# Patient Record
Sex: Female | Born: 1945 | Race: White | Hispanic: No | Marital: Married | Smoking: Never smoker
Health system: Southern US, Community
[De-identification: ages and names within clinical notes are randomized; demographics above are authoritative.]

## PROBLEM LIST (undated history)

## (undated) DIAGNOSIS — K219 Gastro-esophageal reflux disease without esophagitis: Secondary | ICD-10-CM

## (undated) DIAGNOSIS — E669 Obesity, unspecified: Secondary | ICD-10-CM

## (undated) DIAGNOSIS — D649 Anemia, unspecified: Secondary | ICD-10-CM

## (undated) DIAGNOSIS — C50919 Malignant neoplasm of unspecified site of unspecified female breast: Secondary | ICD-10-CM

## (undated) DIAGNOSIS — C50211 Malignant neoplasm of upper-inner quadrant of right female breast: Secondary | ICD-10-CM

## (undated) DIAGNOSIS — F32A Depression, unspecified: Secondary | ICD-10-CM

## (undated) DIAGNOSIS — R232 Flushing: Secondary | ICD-10-CM

## (undated) DIAGNOSIS — M1990 Unspecified osteoarthritis, unspecified site: Secondary | ICD-10-CM

## (undated) DIAGNOSIS — E785 Hyperlipidemia, unspecified: Secondary | ICD-10-CM

## (undated) DIAGNOSIS — E162 Hypoglycemia, unspecified: Secondary | ICD-10-CM

## (undated) DIAGNOSIS — F419 Anxiety disorder, unspecified: Secondary | ICD-10-CM

## (undated) DIAGNOSIS — Z923 Personal history of irradiation: Secondary | ICD-10-CM

## (undated) DIAGNOSIS — I10 Essential (primary) hypertension: Secondary | ICD-10-CM

## (undated) DIAGNOSIS — E538 Deficiency of other specified B group vitamins: Secondary | ICD-10-CM

## (undated) DIAGNOSIS — Z8489 Family history of other specified conditions: Secondary | ICD-10-CM

## (undated) DIAGNOSIS — E039 Hypothyroidism, unspecified: Secondary | ICD-10-CM

## (undated) HISTORY — PX: TUBAL LIGATION: SHX77

## (undated) HISTORY — DX: Hyperlipidemia, unspecified: E78.5

## (undated) HISTORY — DX: Malignant neoplasm of upper-inner quadrant of right female breast: C50.211

## (undated) HISTORY — DX: Essential (primary) hypertension: I10

## (undated) HISTORY — DX: Gastro-esophageal reflux disease without esophagitis: K21.9

## (undated) HISTORY — DX: Flushing: R23.2

## (undated) HISTORY — PX: BREAST BIOPSY: SHX20

## (undated) HISTORY — PX: ABDOMINAL HYSTERECTOMY: SHX81

## (undated) HISTORY — PX: PARTIAL HYSTERECTOMY: SHX80

## (undated) HISTORY — DX: Hypoglycemia, unspecified: E16.2

## (undated) HISTORY — DX: Obesity, unspecified: E66.9

## (undated) HISTORY — PX: BREAST LUMPECTOMY: SHX2

## (undated) HISTORY — PX: KNEE SURGERY: SHX244

## (undated) HISTORY — PX: HERNIA REPAIR: SHX51

## (undated) HISTORY — DX: Anemia, unspecified: D64.9

## (undated) HISTORY — DX: Deficiency of other specified B group vitamins: E53.8

---

## 1999-03-04 ENCOUNTER — Other Ambulatory Visit: Admission: RE | Admit: 1999-03-04 | Discharge: 1999-03-04 | Payer: Self-pay | Admitting: *Deleted

## 2000-01-25 ENCOUNTER — Encounter: Admission: RE | Admit: 2000-01-25 | Discharge: 2000-01-25 | Payer: Self-pay | Admitting: *Deleted

## 2000-01-25 ENCOUNTER — Encounter: Payer: Self-pay | Admitting: *Deleted

## 2000-03-22 ENCOUNTER — Other Ambulatory Visit: Admission: RE | Admit: 2000-03-22 | Discharge: 2000-03-22 | Payer: Self-pay | Admitting: *Deleted

## 2001-02-04 ENCOUNTER — Encounter: Payer: Self-pay | Admitting: *Deleted

## 2001-02-04 ENCOUNTER — Encounter: Admission: RE | Admit: 2001-02-04 | Discharge: 2001-02-04 | Payer: Self-pay | Admitting: *Deleted

## 2001-02-07 ENCOUNTER — Other Ambulatory Visit: Admission: RE | Admit: 2001-02-07 | Discharge: 2001-02-07 | Payer: Self-pay | Admitting: *Deleted

## 2002-02-20 ENCOUNTER — Encounter: Admission: RE | Admit: 2002-02-20 | Discharge: 2002-02-20 | Payer: Self-pay | Admitting: *Deleted

## 2002-02-20 ENCOUNTER — Encounter: Payer: Self-pay | Admitting: *Deleted

## 2002-03-24 ENCOUNTER — Other Ambulatory Visit: Admission: RE | Admit: 2002-03-24 | Discharge: 2002-03-24 | Payer: Self-pay | Admitting: *Deleted

## 2003-05-01 ENCOUNTER — Encounter: Admission: RE | Admit: 2003-05-01 | Discharge: 2003-05-01 | Payer: Self-pay | Admitting: *Deleted

## 2003-05-01 ENCOUNTER — Encounter: Payer: Self-pay | Admitting: *Deleted

## 2003-05-05 ENCOUNTER — Other Ambulatory Visit: Admission: RE | Admit: 2003-05-05 | Discharge: 2003-05-05 | Payer: Self-pay | Admitting: *Deleted

## 2003-06-02 ENCOUNTER — Ambulatory Visit (HOSPITAL_COMMUNITY): Admission: RE | Admit: 2003-06-02 | Discharge: 2003-06-02 | Payer: Self-pay | Admitting: Family Medicine

## 2003-06-03 ENCOUNTER — Ambulatory Visit (HOSPITAL_COMMUNITY): Admission: RE | Admit: 2003-06-03 | Discharge: 2003-06-03 | Payer: Self-pay | Admitting: Family Medicine

## 2003-08-01 HISTORY — PX: GASTRIC BYPASS: SHX52

## 2004-06-21 ENCOUNTER — Encounter: Admission: RE | Admit: 2004-06-21 | Discharge: 2004-06-21 | Payer: Self-pay | Admitting: Obstetrics and Gynecology

## 2004-08-11 ENCOUNTER — Other Ambulatory Visit: Admission: RE | Admit: 2004-08-11 | Discharge: 2004-08-11 | Payer: Self-pay | Admitting: Gynecology

## 2004-08-17 ENCOUNTER — Ambulatory Visit: Payer: Self-pay | Admitting: Family Medicine

## 2004-11-17 ENCOUNTER — Ambulatory Visit: Payer: Self-pay | Admitting: Family Medicine

## 2004-11-23 ENCOUNTER — Ambulatory Visit: Payer: Self-pay | Admitting: Family Medicine

## 2005-09-21 ENCOUNTER — Encounter: Admission: RE | Admit: 2005-09-21 | Discharge: 2005-09-21 | Payer: Self-pay | Admitting: Gynecology

## 2005-10-13 ENCOUNTER — Other Ambulatory Visit: Admission: RE | Admit: 2005-10-13 | Discharge: 2005-10-13 | Payer: Self-pay | Admitting: Gynecology

## 2006-03-20 ENCOUNTER — Ambulatory Visit: Payer: Self-pay | Admitting: Family Medicine

## 2006-03-22 ENCOUNTER — Ambulatory Visit: Payer: Self-pay | Admitting: Family Medicine

## 2006-05-18 ENCOUNTER — Ambulatory Visit: Payer: Self-pay | Admitting: Family Medicine

## 2006-10-03 ENCOUNTER — Encounter: Admission: RE | Admit: 2006-10-03 | Discharge: 2006-10-03 | Payer: Self-pay | Admitting: Family Medicine

## 2006-12-19 ENCOUNTER — Other Ambulatory Visit: Admission: RE | Admit: 2006-12-19 | Discharge: 2006-12-19 | Payer: Self-pay | Admitting: Gynecology

## 2007-03-07 DIAGNOSIS — I10 Essential (primary) hypertension: Secondary | ICD-10-CM | POA: Insufficient documentation

## 2007-03-07 DIAGNOSIS — Z6841 Body Mass Index (BMI) 40.0 and over, adult: Secondary | ICD-10-CM | POA: Insufficient documentation

## 2007-03-07 DIAGNOSIS — B029 Zoster without complications: Secondary | ICD-10-CM | POA: Insufficient documentation

## 2007-03-07 DIAGNOSIS — E669 Obesity, unspecified: Secondary | ICD-10-CM | POA: Insufficient documentation

## 2007-03-07 DIAGNOSIS — E7800 Pure hypercholesterolemia, unspecified: Secondary | ICD-10-CM | POA: Insufficient documentation

## 2007-03-07 DIAGNOSIS — K219 Gastro-esophageal reflux disease without esophagitis: Secondary | ICD-10-CM | POA: Insufficient documentation

## 2007-03-07 DIAGNOSIS — E538 Deficiency of other specified B group vitamins: Secondary | ICD-10-CM | POA: Insufficient documentation

## 2007-03-08 ENCOUNTER — Ambulatory Visit: Payer: Self-pay | Admitting: Family Medicine

## 2007-05-28 ENCOUNTER — Ambulatory Visit: Payer: Self-pay | Admitting: Family Medicine

## 2007-05-28 DIAGNOSIS — E162 Hypoglycemia, unspecified: Secondary | ICD-10-CM | POA: Insufficient documentation

## 2007-10-04 ENCOUNTER — Encounter: Admission: RE | Admit: 2007-10-04 | Discharge: 2007-10-04 | Payer: Self-pay | Admitting: Gynecology

## 2007-10-22 ENCOUNTER — Ambulatory Visit: Payer: Self-pay | Admitting: Family Medicine

## 2007-11-29 ENCOUNTER — Ambulatory Visit: Payer: Self-pay | Admitting: Family Medicine

## 2008-05-22 ENCOUNTER — Ambulatory Visit: Payer: Self-pay | Admitting: Family Medicine

## 2008-05-25 LAB — CONVERTED CEMR LAB
AST: 18 units/L (ref 0–37)
Albumin: 4 g/dL (ref 3.5–5.2)
Alkaline Phosphatase: 47 units/L (ref 39–117)
BUN: 17 mg/dL (ref 6–23)
Basophils Relative: 0.8 % (ref 0.0–3.0)
Bilirubin, Direct: 0.1 mg/dL (ref 0.0–0.3)
Calcium: 8.6 mg/dL (ref 8.4–10.5)
Cholesterol: 160 mg/dL (ref 0–200)
Eosinophils Absolute: 0.2 10*3/uL (ref 0.0–0.7)
Eosinophils Relative: 3.6 % (ref 0.0–5.0)
Folate: 10.7 ng/mL
GFR calc non Af Amer: 78 mL/min
HDL: 54.6 mg/dL (ref >39.0)
MCV: 90.7 fL (ref 78.0–100.0)
Neutro Abs: 3.2 10*3/uL (ref 1.4–7.7)
Potassium: 3.5 meq/L (ref 3.5–5.1)
Total Bilirubin: 0.7 mg/dL (ref 0.3–1.2)
Total CHOL/HDL Ratio: 2.9
Total Protein: 7 g/dL (ref 6.0–8.3)
Triglycerides: 70 mg/dL (ref 0–149)
VLDL: 14 mg/dL (ref 0–40)
Vitamin B-12: >1500 pg/mL — ABNORMAL HIGH (ref 211–911)

## 2008-11-20 ENCOUNTER — Ambulatory Visit: Payer: Self-pay | Admitting: Women's Health

## 2008-11-20 ENCOUNTER — Encounter: Payer: Self-pay | Admitting: Women's Health

## 2008-11-20 ENCOUNTER — Other Ambulatory Visit: Admission: RE | Admit: 2008-11-20 | Discharge: 2008-11-20 | Payer: Self-pay | Admitting: Gynecology

## 2008-11-27 ENCOUNTER — Encounter: Admission: RE | Admit: 2008-11-27 | Discharge: 2008-11-27 | Payer: Self-pay | Admitting: Gynecology

## 2008-11-27 ENCOUNTER — Ambulatory Visit: Payer: Self-pay | Admitting: Obstetrics and Gynecology

## 2008-12-04 ENCOUNTER — Encounter: Admission: RE | Admit: 2008-12-04 | Discharge: 2008-12-04 | Payer: Self-pay | Admitting: Gynecology

## 2009-12-07 ENCOUNTER — Ambulatory Visit: Payer: Self-pay | Admitting: Family Medicine

## 2009-12-07 DIAGNOSIS — R4589 Other symptoms and signs involving emotional state: Secondary | ICD-10-CM | POA: Insufficient documentation

## 2009-12-07 DIAGNOSIS — R5381 Other malaise: Secondary | ICD-10-CM | POA: Insufficient documentation

## 2009-12-07 DIAGNOSIS — R5383 Other fatigue: Secondary | ICD-10-CM

## 2009-12-08 LAB — CONVERTED CEMR LAB
CO2: 32 meq/L (ref 19–32)
Chloride: 101 meq/L (ref 96–112)
Cholesterol: 174 mg/dL (ref 0–200)
Creatinine, Ser: 0.6 mg/dL (ref 0.4–1.2)
Eosinophils Absolute: 0.2 10*3/uL (ref 0.0–0.7)
GFR calc non Af Amer: 99.73 mL/min (ref >60)
HDL: 66.1 mg/dL (ref >39.00)
Lymphs Abs: 1.9 10*3/uL (ref 0.7–4.0)
MCHC: 33.6 g/dL (ref 30.0–36.0)
MCV: 84.2 fL (ref 78.0–100.0)
Monocytes Absolute: 0.6 10*3/uL (ref 0.1–1.0)
Neutro Abs: 3.6 10*3/uL (ref 1.4–7.7)
Platelets: 275 10*3/uL (ref 150.0–400.0)
RDW: 15.4 % — ABNORMAL HIGH (ref 11.5–14.6)
Total Bilirubin: 0.5 mg/dL (ref 0.3–1.2)
Triglycerides: 77 mg/dL (ref 0.0–149.0)

## 2009-12-09 ENCOUNTER — Encounter: Payer: Self-pay | Admitting: Family Medicine

## 2009-12-28 ENCOUNTER — Ambulatory Visit: Payer: Self-pay | Admitting: Family Medicine

## 2009-12-28 DIAGNOSIS — D509 Iron deficiency anemia, unspecified: Secondary | ICD-10-CM | POA: Insufficient documentation

## 2009-12-28 DIAGNOSIS — E559 Vitamin D deficiency, unspecified: Secondary | ICD-10-CM | POA: Insufficient documentation

## 2010-01-03 ENCOUNTER — Ambulatory Visit: Payer: Self-pay | Admitting: Family Medicine

## 2010-01-05 ENCOUNTER — Encounter (INDEPENDENT_AMBULATORY_CARE_PROVIDER_SITE_OTHER): Payer: Self-pay | Admitting: *Deleted

## 2010-02-08 ENCOUNTER — Ambulatory Visit: Payer: Self-pay | Admitting: Family Medicine

## 2010-02-22 ENCOUNTER — Ambulatory Visit: Payer: Self-pay | Admitting: Family Medicine

## 2010-02-23 LAB — CONVERTED CEMR LAB
Basophils Relative: 0.9 % (ref 0.0–3.0)
Eosinophils Relative: 3.5 % (ref 0.0–5.0)
Ferritin: 12.6 ng/mL (ref 10.0–291.0)
HCT: 36.6 % (ref 36.0–46.0)
Hemoglobin: 12.3 g/dL (ref 12.0–15.0)
Iron: 74 ug/dL (ref 42–145)
Lymphocytes Relative: 31.5 % (ref 12.0–46.0)
MCV: 88.3 fL (ref 78.0–100.0)
RBC: 4.15 M/uL (ref 3.87–5.11)
WBC: 7 10*3/uL (ref 4.5–10.5)

## 2010-02-24 LAB — CONVERTED CEMR LAB: Vit D, 25-Hydroxy: 28 ng/mL — ABNORMAL LOW (ref 30–89)

## 2010-04-20 ENCOUNTER — Ambulatory Visit: Payer: Self-pay | Admitting: Family Medicine

## 2010-05-20 ENCOUNTER — Ambulatory Visit: Payer: Self-pay | Admitting: Family Medicine

## 2010-05-24 ENCOUNTER — Encounter: Admission: RE | Admit: 2010-05-24 | Discharge: 2010-05-24 | Payer: Self-pay | Admitting: Family Medicine

## 2010-05-24 LAB — HM MAMMOGRAPHY

## 2010-05-27 ENCOUNTER — Encounter: Payer: Self-pay | Admitting: Family Medicine

## 2010-06-21 ENCOUNTER — Ambulatory Visit: Payer: Self-pay | Admitting: Family Medicine

## 2010-07-19 ENCOUNTER — Ambulatory Visit: Payer: Self-pay | Admitting: Family Medicine

## 2010-08-21 ENCOUNTER — Encounter: Payer: Self-pay | Admitting: Gynecology

## 2010-08-29 ENCOUNTER — Ambulatory Visit: Admit: 2010-08-29 | Payer: Self-pay | Admitting: Family Medicine

## 2010-08-30 NOTE — Assessment & Plan Note (Signed)
Summary: MEDS FOR REFILL/RI   Vital Signs:  Patient profile:   65 year old female Height:      63.25 inches Weight:      182.75 pounds BMI:     32.23 Temp:     97.9 degrees F oral Pulse rate:   60 / minute Pulse rhythm:   regular BP sitting:   158 / 76  (left arm) Cuff size:   regular  Vitals Entered By: Lewanda Rife LPN (Dec 07, 2009 8:40 AM)  Serial Vital Signs/Assessments:  Time      Position  BP       Pulse  Resp  Temp     By                     138/70                         [name] MD  CC: Refill meds   History of Present Illness: here forf/u of HTN and lipids and B12 def   has been tired and stressed and run down  daughter had toxemia -- got really sick / CS - now has heart damage  taking care of the baby --- she is retired now thank goodness, father is not involved  overall a good baby - is 8 mo old  daughter needs a lot of medical care   hx of gastric bypass wt gain 24 lb from last visit  has been back to her surgeon for a check up  she is off the wagon and not exercising like she should  has f/u upcoming - will do some refresher education   thinks she needs her labs checked  low sugar in past   B12 shot every 2 mo -- is overdue  she does take her vitamins  not eating as much protien as she should   [facility]  [facility]   bp high today 158/76    Allergies: 1)  Penicillin  Past History:  Past Medical History: Last updated: 05/22/2008 GERD Hypertension hyperlipidemia obesity B12 def  hypoglycemia   Past Surgical History: Last updated: 2007-03-19 Hysterectomy- partial, bleeding (1990's) Left knee surgery (11/1998) Dexa- normal (08/2001,   09/2003) Gastric bypass surgery (08/2003) Hernia repair  Family History: Last updated: March 19, 2007 Father: deceased MI- smoker, ETOH Mother: deceased MI Siblings:   Social History: Last updated: 05/22/2008 Marital Status: Married Children: several  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 Eyes:  Denies blurring and eye irritation. CV:  Denies chest pain or discomfort, lightheadness, and palpitations. Resp:  Denies cough, shortness of breath, and wheezing. GI:  Denies abdominal pain, indigestion, and nausea. GU:  Denies discharge and dysuria. MS:  Complains of stiffness; denies joint pain and muscle aches. Derm:  Denies itching, lesion(s), poor wound healing, and rash. Neuro:  Denies numbness and tingling. Psych:  difficult stress - thinks she is doing ok . Endo:  Denies cold intolerance, excessive thirst, excessive urination, and heat intolerance. Heme:  Denies abnormal bruising and bleeding.  Physical Exam  General:  overweight but generally well appearing  Head:  normocephalic, atraumatic, and no abnormalities observed.   Eyes:  vision grossly intact, pupils equal, pupils round, and pupils reactive to light.  no conjunctival pallor, injection or  icterus  Ears:  R ear normal and L ear normal.   Nose:  no nasal discharge.   Mouth:  pharynx pink and moist.   Neck:  supple with full rom and no masses or thyromegally, no JVD or carotid bruit  Chest Wall:  No deformities, masses, or tenderness noted. Lungs:  Normal respiratory effort, chest expands symmetrically. Lungs are clear to auscultation, no crackles or wheezes. Heart:  Normal rate and regular rhythm. S1 and S2 normal without gallop, murmur, click, rub or other extra sounds. Abdomen:  Bowel sounds positive,abdomen soft and non-tender without masses, organomegaly or hernias noted. no renal bruits  Msk:  No deformity or scoliosis noted of thoracic or lumbar spine.  no acute joint changes Pulses:  R and L carotid,radial,femoral,dorsalis pedis and posterior tibial pulses are full and equal bilaterally Extremities:  No  clubbing, cyanosis, edema, or deformity noted with normal full range of motion of all joints.   Neurologic:  sensation intact to light touch, gait normal, and DTRs symmetrical and normal.   Skin:  Intact without suspicious lesions or rashes Cervical Nodes:  No lymphadenopathy noted Inguinal Nodes:  No significant adenopathy Psych:  normal affect, talkative and pleasant    Impression & Recommendations:  Problem # 1:  FATIGUE (ICD-780.79) Assessment New  suspect multifactorial with stress playing a big role also s/p gastric bypass and noncompliant with app diet and b12 shots B12 shot today lab  f/u 2-3 wk to disc   Orders: Prescription Created Electronically [phone])  Problem # 2:  HYPOGLYCEMIA NOS (ICD-251.2) Assessment: Deteriorated disc imp of low calorie freq meals with protien pt plans to return to dietary education lab today Orders: Venipuncture (60454) TLB-B12 + Folate Pnl (82746_82607-B12/FOL) TLB-Lipid Panel (80061-LIPID) TLB-Renal Function Panel (80069-RENAL) TLB-CBC Platelet - w/Differential (85025-CBCD) TLB-Hepatic/Liver Function Pnl (80076-HEPATIC) TLB-TSH (Thyroid Stimulating Hormone) (84443-TSH) T-Vitamin D (25-Hydroxy) (09811-91478) Prescription Created Electronically [phone])  Problem # 3:  VITAMIN B12 DEFICIENCY (ICD-266.2) Assessment: Deteriorated gastric bypass pt behind on suppl shot today check level  disc at f/u Orders: Venipuncture (13086) TLB-B12 + Folate Pnl (82746_82607-B12/FOL) TLB-Lipid Panel (80061-LIPID) TLB-Renal Function Panel (80069-RENAL) TLB-CBC Platelet - w/Differential (85025-CBCD) TLB-Hepatic/Liver Function Pnl (80076-HEPATIC) TLB-TSH (Thyroid Stimulating Hormone) (84443-TSH) T-Vitamin D (25-Hydroxy) (57846-96295) Vit B12 1000 mcg (J3420) Admin of Therapeutic Inj  intramuscular or subcutaneous (28413) Prescription Created Electronically [phone])  Problem # 4:  Hx of OBESITY (ICD-278.00) Assessment: Deteriorated wt gain after  gastric bypass due to stress and bad habits disc this in detail  f/u with surgeon and make plan  Problem # 5:  Hx of HYPERCHOLESTEROLEMIA (ICD-272.0) Assessment: Unchanged  lab today with diet and statin rev low sat fat diet today f.u to disc Her updated medication list for this problem includes:    Zocor 20 Mg Tabs (Simvastatin) .Marland Kitchen... Take one by mouth daily  Orders: Venipuncture (02725) TLB-B12 + Folate Pnl (36644_03474-Q59/DGL) TLB-Lipid Panel (80061-LIPID) TLB-Renal Function Panel (80069-RENAL) TLB-CBC Platelet - w/Differential (85025-CBCD) TLB-Hepatic/Liver Function Pnl (80076-HEPATIC) TLB-TSH (Thyroid Stimulating Hormone) (84443-TSH) T-Vitamin D (25-Hydroxy) (87564-33295) Prescription Created Electronically [phone])  Labs Reviewed: SGOT: 18 (05/22/2008)   SGPT: 13 (05/22/2008)   HDL:54.6 (05/22/2008)  LDL:91 (05/22/2008)  Chol:160 (05/22/2008)  Trig:70 (05/22/2008)  Problem # 6:  STRESS REACTION, ACUTE, WITH EMOTIONAL DISTURBANCE (ICD-308.0) Assessment: New  needs help at home caring for baby and her daughter disc options is overloaded and unable to care for herself - voiced understanding counseling may help in future  has good support  will disc further at f/u  Orders: Prescription Created Electronically [phone])  Complete Medication List: 1)  Zocor 20 Mg Tabs (Simvastatin) .... Take one by mouth daily 2)  Omeprazole 20 Mg Cpdr (Omeprazole) .Marland Kitchen.. 1 by mouth once daily 3)  Hydrochlorothiazide 25 Mg Tabs (Hydrochlorothiazide) .... Take 1 tablet by mouth once a day 4)  Vitamin B-12 Cr 1000 Mcg Tbcr (Cyanocobalamin) .... Injection q 2 months 5)  Microgestin 1.5/30 1.5-30 Mg-mcg Tabs (Norethindrone acet-ethinyl est) 6)  Multivitamins Tabs (Multiple vitamin) .... One by mouth daily 7)  Fish Oil Tabs  .... Daily 8)  Calcium  .... Daily 9)  Vitamin E Tabs  .... Daily  Patient Instructions: 1)  get back to low cal frequent meals with protien 2)  B12 shot today  3)   get back to exercise  4)  labs today 5)  follow up in about 2-3 weeks  Prescriptions: HYDROCHLOROTHIAZIDE 25 MG TABS (HYDROCHLOROTHIAZIDE) Take 1 tablet by mouth once a day  #90 x 3   Entered and Authorized by:   [name] MD   Signed by:   [name] MD on 12/07/2009   Method used:   Electronically to        [facility] [address] (retail)       [address], [address]       [place], [place]  [address]       Ph: [phone]       Fax: [phone]   RxID:   [id number] OMEPRAZOLE 20 MG CPDR (OMEPRAZOLE) 1 by mouth once daily  #90 x 3   Entered and Authorized by:   [name] MD   Signed by:   [name] MD on 12/07/2009   Method used:   Electronically to        [facility] [address] (retail)       [address], [address]       [place], [place]  [address]       Ph: [phone]       Fax: [phone]   RxID:   [id number] ZOCOR 20 MG TABS (SIMVASTATIN) take one by mouth daily  #90 x 3   Entered and Authorized by:   [name] MD   Signed by:   [name] MD on 12/07/2009   Method used:   Electronically to        [facility] [address] (retail)       [address], [address]       [place], [place]  [address]       Ph: [phone]       Fax: [phone]   RxID:   [id number]   Current Allergies (reviewed today): PENICILLIN   Medication Administration  Injection # 1:    Medication: Vit B12 1000 mcg    Diagnosis: VITAMIN B12 DEFICIENCY (ICD-266.2)    Route: IM    Site: L deltoid    Exp Date: 06/01/2011    Lot #: [id number]    Mfr: American Regent    Patient tolerated injection without complications    Given by: Lewanda Rife LPN (Dec 07, 2009 10:34 AM)  Orders Added: 1)  Venipuncture [44315] 2)  TLB-B12 + Folate Pnl [82746_82607-B12/FOL] 3)  TLB-Lipid Panel [80061-LIPID] 4)  TLB-Renal Function Panel [80069-RENAL] 5)  TLB-CBC Platelet -  w/Differential [85025-CBCD]  6)  TLB-Hepatic/Liver Function Pnl [80076-HEPATIC] 7)  TLB-TSH (Thyroid Stimulating Hormone) [84443-TSH] 8)  T-Vitamin D (25-Hydroxy) [[address]-60454] 9)  Vit B12 1000 mcg [J3420] 10)  Admin of Therapeutic Inj  intramuscular or subcutaneous [96372] 11)  Prescription Created Electronically [G8553] 12)  Est. Patient Level IV [09811]

## 2010-08-30 NOTE — Letter (Signed)
Summary: Results Follow up Letter  [place] at [facility]  [address] [place], [place] [address]   Phone: [phone]  Fax: [phone]    01/05/2010 MRN: [id number]    Jennifer Bartlett [address] [facility] [address] [place], [place]  [address]    Dear [name],  The following are the results of your recent test(s):  Test         Result    Pap Smear:        Normal _____  Not Normal _____ Comments: ______________________________________________________ Cholesterol: LDL(Bad cholesterol):         Your goal is less than:         HDL (Good cholesterol):       Your goal is more than: Comments:  ______________________________________________________ Mammogram:        Normal _____  Not Normal _____ Comments:  ___________________________________________________________________ Hemoccult:        Normal __X___  Not normal _______ Comments:      _____________________________________________________________________ Other Tests:    We routinely do not discuss normal results over the telephone.  If you desire a copy of the results, or you have any questions about this information we can discuss them at your next office visit.   Sincerely,    Marne A. Milinda Antis, M.D.  MAT:lsf

## 2010-08-30 NOTE — Letter (Signed)
Summary: Results Follow up Letter  [place] at [facility]  [address] [place], [place] [address]   Phone: [phone]  Fax: [phone]    05/27/2010 MRN: [id number]    Jennifer Bartlett [address] [place], [place]  [address]    Dear Ms. Devonshire,  The following are the results of your recent test(s):  Test         Result    Pap Smear:        Normal _____  Not Normal _____ Comments: ______________________________________________________ Cholesterol: LDL(Bad cholesterol):         Your goal is less than:         HDL (Good cholesterol):       Your goal is more than: Comments:  ______________________________________________________ Mammogram:        Normal ___X__  Not Normal _____ Comments:Repeat in one year.   ___________________________________________________________________ Hemoccult:        Normal _____  Not normal _______ Comments:    _____________________________________________________________________ Other Tests:    We routinely do not discuss normal results over the telephone.  If you desire a copy of the results, or you have any questions about this information we can discuss them at your next office visit.   Sincerely,    Idamae Schuller Tower,MD  MT/ri

## 2010-08-30 NOTE — Assessment & Plan Note (Signed)
Summary: 2-3 WEEK FOLLOW UP/RBH   Vital Signs:  Patient profile:   65 year old female Height:      63.25 inches Weight:      178 pounds BMI:     31.40 Temp:     97.7 degrees F oral Pulse rate:   60 / minute Pulse rhythm:   regular BP sitting:   120 / 76  (left arm) Cuff size:   regular  Vitals Entered By: Lewanda Rife LPN (Dec 28, 2009 8:20 AM) CC: 2-3 week follow up   History of Present Illness: here for f/u of fatigue and multiple issues   is starting to feel some better overall with supplements   wt is down 4 lb and bp is better today 120/76  vit D def -- level was 14 started on high dose weekly tx   lipids fairly good with trig 77/ HDL 66 and LDL 93  anemia noted with hv of 10.9-- she was inst to start ferrous sulfate 325 daily if tol (this was down from 12.3 pref)  is not suprised by that based on her diet habits  does not care for meat and high iron foods  usually avoids red meat   has f/u with her gastric bypass surgeon   cannot do colonosc due to her surgery / risks   [facility] -- working on it a lot more   Allergies: 1)  Penicillin  Past History:  Past Medical History: Last updated: 05/22/2008 GERD Hypertension hyperlipidemia obesity B12 def  hypoglycemia   Past Surgical History: Last updated: 2007-03-26 Hysterectomy- partial, bleeding (1990's) Left knee surgery (11/1998) Dexa- normal (08/2001,   09/2003) Gastric bypass surgery (08/2003) Hernia repair  Family History: Last updated: 03-26-07 Father: deceased MI- smoker, ETOH Mother: deceased MI Siblings:   Social History: Last updated: 05/22/2008 Marital Status: Married Children: several  Occupation: [employer] regular walking for exercise  Risk Factors: Smoking Status: never (2007-03-26)  Review of Systems General:  Complains of fatigue; denies fever, loss of appetite, and malaise. Eyes:  Denies blurring and eye irritation. CV:  Denies chest pain or  discomfort and lightheadness. Resp:  Denies cough, shortness of breath, and wheezing. GI:  Denies abdominal pain, change in bowel habits, and indigestion. MS:  Denies joint pain, joint redness, and joint swelling. Derm:  Denies itching, lesion(s), poor wound healing, and rash. Neuro:  Denies numbness and tingling. Psych:  mood is fairly good . Endo:  Denies cold intolerance, excessive thirst, excessive urination, and heat intolerance. Heme:  Denies abnormal bruising and bleeding.  Physical Exam  General:  overweight but generally well appearing  Head:  normocephalic, atraumatic, and no abnormalities observed.   Eyes:  vision grossly intact, pupils equal, pupils round, and pupils reactive to light.  no conjunctival pallor, injection or icterus  Mouth:  pharynx pink and moist.   Neck:  supple with full rom and no masses or thyromegally, no JVD or carotid bruit  Lungs:  Normal respiratory effort, chest expands symmetrically. Lungs are clear to auscultation, no crackles or wheezes. Heart:  Normal rate and regular rhythm. S1 and S2 normal without gallop, murmur, click, rub or other extra sounds. Abdomen:  Bowel sounds positive,abdomen soft and non-tender without masses, organomegaly or hernias noted. no renal bruits  Extremities:  No clubbing, cyanosis, edema, or deformity noted with normal full range of motion of all joints.   Neurologic:  sensation intact to light touch, gait normal, and DTRs symmetrical and  normal.   Skin:  Intact without suspicious lesions or rashes Cervical Nodes:  No lymphadenopathy noted Inguinal Nodes:  No significant adenopathy Psych:  normal affect, talkative and pleasant    Impression & Recommendations:  Problem # 1:  FATIGUE (ICD-780.79) Assessment Improved this is imp with correction of B12 def and supplement of iron and vit D as well as better diet and exercise urged her to keep it up   Problem # 2:  HYPOGLYCEMIA NOS (ICD-251.2) Assessment: Improved imp  with higher protien diet   Problem # 3:  VITAMIN B12 DEFICIENCY (ICD-266.2) Assessment: Improved rev labs with pt  level ok after shot  continue Q 2 mo shots for gastric bypass pt  Problem # 4:  Hx of HYPERCHOLESTEROLEMIA (ICD-272.0) Assessment: Unchanged  rev labs in detail- is well controlled with statin and diet  no changes disc low sat fat diet  Her updated medication list for this problem includes:    Zocor 20 Mg Tabs (Simvastatin) .Marland Kitchen... Take one by mouth daily  Labs Reviewed: SGOT: 21 (12/07/2009)   SGPT: 16 (12/07/2009)   HDL:66.10 (12/07/2009), 54.6 (05/22/2008)  LDL:93 (12/07/2009), 91 (05/22/2008)  Chol:174 (12/07/2009), 160 (05/22/2008)  Trig:77.0 (12/07/2009), 70 (05/22/2008)  Problem # 5:  HYPERTENSION (ICD-401.9) Assessment: Improved  bp is much imp today no change in med keep up good habits  Her updated medication list for this problem includes:    Hydrochlorothiazide 25 Mg Tabs (Hydrochlorothiazide) .Marland Kitchen... Take 1 tablet by mouth once a day  BP today: 120/76 Prior BP: 158/76 (12/07/2009)  Labs Reviewed: K+: 4.3 (12/07/2009) Creat: : 0.6 (12/07/2009)   Chol: 174 (12/07/2009)   HDL: 66.10 (12/07/2009)   LDL: 93 (12/07/2009)   TG: 77.0 (12/07/2009)  Problem # 6:  ANEMIA, IRON DEFICIENCY (ICD-280.9) Assessment: New  this could be causing signif fatigue  is feeling better with iron 325 daily- is tolerating  disc stool softeners as needed  lab in 8 weeks and adv also given stool immunoassay card for screen  Her updated medication list for this problem includes:    Vitamin B-12 Cr 1000 Mcg Tbcr (Cyanocobalamin) ..... Injection q 2 months  Hgb: 10.9 (12/07/2009)   Hct: 32.6 (12/07/2009)   Platelets: 275.0 (12/07/2009) RBC: 3.88 (12/07/2009)   RDW: 15.4 (12/07/2009)   WBC: 6.4 (12/07/2009) MCV: 84.2 (12/07/2009)   MCHC: 33.6 (12/07/2009) B12: >1500 pg/mL (12/07/2009)   Folate: 11.2 (12/07/2009)   TSH: 2.30 (12/07/2009)  Problem # 7:  UNSPECIFIED VITAMIN D  DEFICIENCY (ICD-268.9) Assessment: New new and significant feeling better with high dose weekly suppl disc outdoor time  re check in 8 weeks and adv   Complete Medication List: 1)  Zocor 20 Mg Tabs (Simvastatin) .... Take one by mouth daily 2)  Omeprazole 20 Mg Cpdr (Omeprazole) .Marland Kitchen.. 1 by mouth once daily 3)  Hydrochlorothiazide 25 Mg Tabs (Hydrochlorothiazide) .... Take 1 tablet by mouth once a day 4)  Vitamin B-12 Cr 1000 Mcg Tbcr (Cyanocobalamin) .... Injection q 2 months 5)  Microgestin 1.5/30 1.5-30 Mg-mcg Tabs (Norethindrone acet-ethinyl est) 6)  Multivitamins Tabs (Multiple vitamin) .... One by mouth daily 7)  Fish Oil Tabs  .... Daily 8)  Calcium  .... Daily 9)  Vitamin E Tabs  .... Daily 10)  Vitamin D (ergocalciferol) 50000 Unit Caps (Ergocalciferol) .... Take one capsule by mouth every week for 10 weeks  Patient Instructions: 1)  please do stool card for colon cancer screening  2)  continue iron  3)  continue the vitamin D  4)  schedule non fasting labs in 8 weeks for vit D and also cbc with diff/ iron level/ ferritin for anemia and vit D def  5)  keep up healthy diet and exercise - make sure you get enough protien  6)  schedule nurse visit for B12 shot in about 6 weeks  7)  follow up with me in about 6 months   Current Allergies (reviewed today): PENICILLIN

## 2010-08-30 NOTE — Assessment & Plan Note (Signed)
Summary: FLU SHOT/CLE  Nurse Visit   Allergies: 1)  Penicillin  Immunizations Administered:  Influenza Vaccine # 1:    Vaccine Type: Fluvax 3+    Site: left deltoid    Mfr: GlaxoSmithKline    Dose: 0.5 ml    Route: IM    Given by: Mervin Hack CMA (AAMA)    Exp. Date: 01/28/2011    Lot #: [id number]    VIS given: 02/22/10 version given April 20, 2010.  Flu Vaccine Consent Questions:    Do you have a history of severe allergic reactions to this vaccine? no    Any prior history of allergic reactions to egg and/or gelatin? no    Do you have a sensitivity to the preservative Thimersol? no    Do you have a past history of Guillan-Barre Syndrome? no    Do you currently have an acute febrile illness? no    Have you ever had a severe reaction to latex? no    Vaccine information given and explained to patient? yes    Are you currently pregnant? no  Medication Administration  Injection # 1:    Medication: Vit B12 1000 mcg    Diagnosis: VITAMIN B12 DEFICIENCY (ICD-266.2)    Route: IM    Site: R deltoid    Exp Date: 10/30/2011    Lot #: [id number]    Mfr: American Regent    Patient tolerated injection without complications    Given by: Mervin Hack CMA Duncan Dull) (April 20, 2010 4:49 PM)  Orders Added: 1)  Vit B12 1000 mcg [J3420] 2)  Admin of Therapeutic Inj  intramuscular or subcutaneous [96372] 3)  Flu Vaccine 41yrs + [90658] 4)  Admin 1st Vaccine [90471]   Medication Administration  Injection # 1:    Medication: Vit B12 1000 mcg    Diagnosis: VITAMIN B12 DEFICIENCY (ICD-266.2)    Route: IM    Site: R deltoid    Exp Date: 10/30/2011    Lot #: [id number]    Mfr: American Regent    Patient tolerated injection without complications    Given by: Mervin Hack CMA Duncan Dull) (April 20, 2010 4:49 PM)  Orders Added: 1)  Vit B12 1000 mcg [J3420] 2)  Admin of Therapeutic Inj  intramuscular or subcutaneous [96372] 3)  Flu Vaccine 106yrs + [90658] 4)  Admin 1st Vaccine  [09811]

## 2010-08-30 NOTE — Miscellaneous (Signed)
Summary: Vitamin D 50000iu rx  Clinical Lists Changes  Medications: Added new medication of VITAMIN D (ERGOCALCIFEROL) 50000 UNIT CAPS (ERGOCALCIFEROL) Take one capsule by mouth every week for 10 weeks - Signed Rx of VITAMIN D (ERGOCALCIFEROL) 50000 UNIT CAPS (ERGOCALCIFEROL) Take one capsule by mouth every week for 10 weeks;  #10 x 0;  Signed;  Entered by: Lewanda Rife LPN;  Authorized by: [name] MD;  Method used: Electronically to [facility] [address], [place], [place], [place]  [address], Ph: [phone], Fax: [phone]    Prescriptions: VITAMIN D (ERGOCALCIFEROL) 50000 UNIT CAPS (ERGOCALCIFEROL) Take one capsule by mouth every week for 10 weeks  #10 x 0   Entered by:   Lewanda Rife LPN   Authorized by:   [name] MD   Signed by:   Lewanda Rife LPN on 13/02/6577   Method used:   Electronically to        [facility] [address] (retail)       [address]       [place], [place]  [address]       Ph: [phone]       Fax: [phone]   RxID:   [id number]  Pt has scheduled appt for f/u Vit D lab on 02/21/10 at 8:40 am.Rena Isley LPN  Dec 09, 2009 12:19 PM  Current Allergies: PENICILLIN

## 2010-08-30 NOTE — Assessment & Plan Note (Signed)
Summary: B12 TOWER/DLO  Nurse Visit   Allergies: 1)  Penicillin  Medication Administration  Injection # 1:    Medication: Vit B12 1000 mcg    Diagnosis: VITAMIN B12 DEFICIENCY (ICD-266.2)    Route: IM    Site: R deltoid    Exp Date: 10/30/2011    Lot #: [id number]    Mfr: American Regent    Patient tolerated injection without complications    Given by: Lewanda Rife LPN (May 20, 2010 4:36 PM)  Orders Added: 1)  Vit B12 1000 mcg [J3420] 2)  Admin of Therapeutic Inj  intramuscular or subcutaneous [98119]

## 2010-08-30 NOTE — Letter (Signed)
Summary: [facility]: Immunoassay Fecal Occult Blood (iFOB) Order Form  [place] at [facility]  [address] [place], [place] [address]   Phone: [phone]  Fax: [phone]      [facility]: Immunoassay Fecal Occult Blood (iFOB) Order Form   Dec 28, 2009 MRN: [id number]   Jennifer Bartlett April 29, 1946   Physicican Name:____Tower_____________________  Diagnosis Code:_____V76.25_____________________      [name] MD

## 2010-08-30 NOTE — Assessment & Plan Note (Signed)
Summary: B12 SHOT / LFW  Nurse Visit   Vitals Entered By: Lewanda Rife LPN (February 08, 2010 8:32 AM)  Allergies: 1)  Penicillin  Medication Administration  Injection # 1:    Medication: Vit B12 1000 mcg    Diagnosis: VITAMIN B12 DEFICIENCY (ICD-266.2)    Route: IM    Site: R deltoid    Exp Date: 06/01/2011    Lot #: [id number]    Mfr: American Regent    Patient tolerated injection without complications    Given by: Lewanda Rife LPN (February 08, 2010 8:32 AM)  Orders Added: 1)  Vit B12 1000 mcg [J3420] 2)  Admin of Therapeutic Inj  intramuscular or subcutaneous [09811]

## 2010-08-30 NOTE — Assessment & Plan Note (Signed)
Summary: shingles, vit b12  Nurse Visit   Vitals Entered By: Benny Lennert CMA Duncan Dull) (June 21, 2010 8:26 AM)  Allergies: 1)  Penicillin  Immunizations Administered:  Zostavax # 1:    Vaccine Type: Zostavax    Site: right deltoid    Mfr: Merck    Dose: 0.5 ml    Route: [place]    Given by: Benny Lennert CMA (AAMA)    Exp. Date: 03/18/2011    Lot #: [id number]    VIS given: 05/12/05 given June 21, 2010.  Medication Administration  Injection # 1:    Medication: Vit B12 1000 mcg    Diagnosis: VITAMIN B12 DEFICIENCY (ICD-266.2)    Route: IM    Site: L deltoid    Exp Date: 01/29/2012    Lot #: [id number]    Mfr: American Regent    Patient tolerated injection without complications    Given by: Benny Lennert CMA Duncan Dull) (June 21, 2010 8:27 AM)  Orders Added: 1)  Vit B12 1000 mcg [J3420] 2)  Admin of Therapeutic Inj  intramuscular or subcutaneous [96372] 3)  Zoster (Shingles) Vaccine Live [90736] 4)  Admin 1st Vaccine [95284]

## 2010-11-19 ENCOUNTER — Encounter: Payer: Self-pay | Admitting: Family Medicine

## 2010-11-25 ENCOUNTER — Ambulatory Visit: Payer: Self-pay | Admitting: Family Medicine

## 2010-12-02 ENCOUNTER — Ambulatory Visit (INDEPENDENT_AMBULATORY_CARE_PROVIDER_SITE_OTHER): Payer: 59 | Admitting: Family Medicine

## 2010-12-02 DIAGNOSIS — E538 Deficiency of other specified B group vitamins: Secondary | ICD-10-CM

## 2010-12-02 MED ORDER — CYANOCOBALAMIN 1000 MCG/ML IJ SOLN
1000.0000 ug | Freq: Once | INTRAMUSCULAR | Status: AC
  Administered 2010-12-02: 1000 ug via INTRAMUSCULAR

## 2010-12-02 NOTE — Progress Notes (Signed)
  Subjective:    Patient ID: Jennifer Bartlett, female    DOB: [birth year], 65 y.o.   MRN: [id number]  HPI  Here for injection  Review of Systems     Objective:   Physical Exam        Assessment & Plan:

## 2010-12-02 NOTE — Progress Notes (Signed)
  Subjective:    Patient ID: Jennifer Bartlett, female    DOB: 11/24/1945, 64 y.o.   MRN: [id number]  HPI  Here for injection  Review of Systems     Objective:   Physical Exam        Assessment & Plan:   

## 2010-12-14 ENCOUNTER — Other Ambulatory Visit: Payer: Self-pay | Admitting: Family Medicine

## 2010-12-16 ENCOUNTER — Encounter: Payer: Self-pay | Admitting: Family Medicine

## 2010-12-16 ENCOUNTER — Ambulatory Visit (INDEPENDENT_AMBULATORY_CARE_PROVIDER_SITE_OTHER): Payer: 59 | Admitting: Family Medicine

## 2010-12-16 DIAGNOSIS — E7800 Pure hypercholesterolemia, unspecified: Secondary | ICD-10-CM

## 2010-12-16 DIAGNOSIS — R5381 Other malaise: Secondary | ICD-10-CM

## 2010-12-16 DIAGNOSIS — E669 Obesity, unspecified: Secondary | ICD-10-CM

## 2010-12-16 DIAGNOSIS — E559 Vitamin D deficiency, unspecified: Secondary | ICD-10-CM

## 2010-12-16 DIAGNOSIS — E538 Deficiency of other specified B group vitamins: Secondary | ICD-10-CM

## 2010-12-16 DIAGNOSIS — D509 Iron deficiency anemia, unspecified: Secondary | ICD-10-CM

## 2010-12-16 DIAGNOSIS — I10 Essential (primary) hypertension: Secondary | ICD-10-CM

## 2010-12-16 LAB — LIPID PANEL
Cholesterol: 185 mg/dL (ref 0–200)
LDL Cholesterol: 118 mg/dL — ABNORMAL HIGH (ref 0–99)
Total CHOL/HDL Ratio: 3
VLDL: 13.4 mg/dL (ref 0.0–40.0)

## 2010-12-16 LAB — COMPREHENSIVE METABOLIC PANEL
ALT: 18 U/L (ref 0–35)
AST: 19 U/L (ref 0–37)
Alkaline Phosphatase: 56 U/L (ref 39–117)
Creatinine, Ser: 0.7 mg/dL (ref 0.4–1.2)
Potassium: 4 mEq/L (ref 3.5–5.1)
Total Bilirubin: 0.5 mg/dL (ref 0.3–1.2)

## 2010-12-16 LAB — CBC WITH DIFFERENTIAL/PLATELET
Basophils Absolute: 0 10*3/uL (ref 0.0–0.1)
Eosinophils Relative: 3.7 % (ref 0.0–5.0)
HCT: 36.7 % (ref 36.0–46.0)
Lymphs Abs: 2.2 10*3/uL (ref 0.7–4.0)
MCHC: 34 g/dL (ref 30.0–36.0)
Monocytes Absolute: 0.6 10*3/uL (ref 0.1–1.0)
RBC: 4.01 Mil/uL (ref 3.87–5.11)
WBC: 7 10*3/uL (ref 4.5–10.5)

## 2010-12-16 LAB — TSH: TSH: 2.11 u[IU]/mL (ref 0.35–5.50)

## 2010-12-16 NOTE — Assessment & Plan Note (Signed)
Check this today  On supplement compliantly

## 2010-12-16 NOTE — Assessment & Plan Note (Signed)
This is well controlled No changes  Enc exercise and better diet  Lab today

## 2010-12-16 NOTE — Assessment & Plan Note (Signed)
Labs today Gastric bypass pt  On her supplements

## 2010-12-16 NOTE — Patient Instructions (Signed)
Stop the zocor for now - in 2 weeks call and let me know how pain is  Try to make effort with diet and exercise  Labs today  Follow up in 3 months

## 2010-12-16 NOTE — Assessment & Plan Note (Signed)
From bad social situation at home and having to take full time care of a toddler

## 2010-12-16 NOTE — Assessment & Plan Note (Signed)
In gastric bypass pt- taking shots Q 2 mo compliantly Lab today

## 2010-12-16 NOTE — Progress Notes (Signed)
Subjective:    Patient ID: Jennifer Bartlett, female    DOB: 12/22/1945, 65 y.o.   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  Is not taking care of herself Poor social situation at home  Husband is helping a lot    On zocor  Was well controlled 1 year ago with LDL90s Pain in muscles and joints --thinks it is from med Takes it every other day Knees and elbows hurt and aches all over -- and even in upper back- spasm   Diet- not optimal   Wt is up  21 lb from last visit Had wt loss surgery in the past  Is emotional eating - this is a new habit  Lack of time and bad choices   HTN in good control with 122/74 today No cp or edema or sob  Anemia - no check lately --is taking her supplements  B12 - shots keeping up with that   D -taking over the counter  Cannot keep up exercise -- no time   Past Medical History  Diagnosis Date  . GERD (gastroesophageal reflux disease)   . Hyperlipidemia   . Obesity   . Vitamin B 12 deficiency   . Hypoglycemia     History   Social History  . Marital Status: Married    Spouse Name: N/A    Number of Children: N/A  . Years of Education: N/A   Occupational History  .  [employer]   Social History Main Topics  . Smoking status: Never Smoker   . Smokeless tobacco: Not on file  . Alcohol Use: Not on file  . Drug Use: Not on file  . Sexually Active: Not on file   Other Topics Concern  . Not on file   Social History Narrative   Regular walking for exercise.         Review of Systems Review of Systems  Constitutional: Negative for fever, appetite change,  and unexpected weight change. pos for fatigue  Eyes: Negative for pain and visual disturbance.  Respiratory: Negative for cough and shortness of breath.   Cardiovascular: Negative.for cp or edema  MSK pos for joint and muscle aching, no swollen joints    Gastrointestinal: Negative  for nausea, diarrhea and constipation.  Genitourinary: Negative for urgency and frequency.  Skin: Negative for pallor.  Neurological: Negative for weakness, light-headedness, numbness and headaches.  Hematological: Negative for adenopathy. Does not bruise/bleed easily.  Psychiatric/Behavioral: Negative for dysphoric mood. The patient is not nervous/anxious.          Objective:   Physical Exam  Constitutional: She appears well-developed and well-nourished. No distress.       overwt and fatigued appearing   HENT:  Head: Normocephalic and atraumatic.  Mouth/Throat: Oropharynx is clear and moist.  Eyes: Conjunctivae and EOM are normal. Pupils are equal, round, and reactive to light.  Neck: Normal range of motion. Neck supple. Carotid bruit is not present. No thyromegaly present.  Cardiovascular: Normal rate, regular rhythm and normal heart sounds.   Pulmonary/Chest: Effort normal and breath sounds normal. No respiratory distress. She has no wheezes. She exhibits no tenderness.  Abdominal: Soft. Bowel sounds are normal. She exhibits no distension, no abdominal bruit and no mass. There is no tenderness.  Musculoskeletal: Normal range of motion. She exhibits tenderness. She exhibits no edema.       Generally tender in  myofascial areas  No joint swelling or tenderness   Lymphadenopathy:    She has no cervical adenopathy.  Neurological: She is alert. She has normal reflexes. She displays normal reflexes. Coordination normal.  Skin: Skin is warm and dry. No rash noted. No erythema. No pallor.  Psychiatric:       Very stressed and tearful at times when disc her social situation          Assessment & Plan:

## 2010-12-16 NOTE — Assessment & Plan Note (Signed)
Wt is up with poor self care and emotional eating  Disc need to get a hold on this Offered counseling Pt motivated for change

## 2010-12-16 NOTE — Assessment & Plan Note (Signed)
Has been well controlled with statin but pt now having side eff of myalgias Will hold 2 wk and call with update Lab today Rev low sat fat diet

## 2011-01-06 ENCOUNTER — Ambulatory Visit (INDEPENDENT_AMBULATORY_CARE_PROVIDER_SITE_OTHER): Payer: 59 | Admitting: Family Medicine

## 2011-01-06 DIAGNOSIS — E538 Deficiency of other specified B group vitamins: Secondary | ICD-10-CM

## 2011-01-06 MED ORDER — CYANOCOBALAMIN 1000 MCG/ML IJ SOLN
1000.0000 ug | Freq: Once | INTRAMUSCULAR | Status: AC
  Administered 2011-01-06: 1000 ug via INTRAMUSCULAR

## 2011-01-06 NOTE — Progress Notes (Signed)
Patient given b12 injection during nurse visit today.

## 2011-02-03 ENCOUNTER — Other Ambulatory Visit: Payer: Self-pay | Admitting: Family Medicine

## 2011-03-20 ENCOUNTER — Ambulatory Visit: Payer: 59 | Admitting: Family Medicine

## 2011-03-21 ENCOUNTER — Ambulatory Visit (INDEPENDENT_AMBULATORY_CARE_PROVIDER_SITE_OTHER): Payer: 59 | Admitting: Family Medicine

## 2011-03-21 DIAGNOSIS — E538 Deficiency of other specified B group vitamins: Secondary | ICD-10-CM

## 2011-03-21 MED ORDER — CYANOCOBALAMIN 1000 MCG/ML IJ SOLN
1000.0000 ug | Freq: Once | INTRAMUSCULAR | Status: AC
  Administered 2011-03-21: 1000 ug via INTRAMUSCULAR

## 2011-03-21 NOTE — Progress Notes (Signed)
Vitamin B12 injection given during nurse visit today.  Patient stated that she has been having some left arm pain.  Her arm hurts mostly when she tries to put it behind her back, like when she puts her bra on.  Patient stated she has no chest pain, and it doesn't feel heart related.  Patient stated that she has a f/u appt with Dr. Milinda Antis next week and will get Dr. Milinda Antis to check it out then.  I advised patient that if her symptoms worsened or changed to please give [place] a call.

## 2011-03-31 ENCOUNTER — Ambulatory Visit (INDEPENDENT_AMBULATORY_CARE_PROVIDER_SITE_OTHER): Payer: 59 | Admitting: Family Medicine

## 2011-03-31 ENCOUNTER — Encounter: Payer: Self-pay | Admitting: Family Medicine

## 2011-03-31 VITALS — BP 118/70 | HR 72 | Temp 98.0°F | Ht 63.25 in | Wt 195.2 lb

## 2011-03-31 DIAGNOSIS — M25519 Pain in unspecified shoulder: Secondary | ICD-10-CM

## 2011-03-31 DIAGNOSIS — I10 Essential (primary) hypertension: Secondary | ICD-10-CM

## 2011-03-31 DIAGNOSIS — E7800 Pure hypercholesterolemia, unspecified: Secondary | ICD-10-CM

## 2011-03-31 DIAGNOSIS — M25512 Pain in left shoulder: Secondary | ICD-10-CM

## 2011-03-31 LAB — LIPID PANEL
HDL: 53.9 mg/dL (ref >39.00)
VLDL: 17.6 mg/dL (ref 0.0–40.0)

## 2011-03-31 LAB — AST: AST: 19 U/L (ref 0–37)

## 2011-03-31 LAB — ALT: ALT: 19 U/L (ref 0–35)

## 2011-03-31 NOTE — Assessment & Plan Note (Signed)
bp remains in very good control  Continue current med  Disc imp of exercise  Rev last lab F/u 6 mo  Enc further wt loss

## 2011-03-31 NOTE — Assessment & Plan Note (Signed)
Pt is off zocor due to aches and pains  Will re check labs today Still loosing wt  Rev last lab  Rev low sat fat diet in detail Will adv after labs  F/u 6 months

## 2011-03-31 NOTE — Assessment & Plan Note (Signed)
With anterior acromion tenderness- limited rotation but good abduction  Suspect tendonitis Has failed nsaids and heat/ice Ref to sports med - ? Needs further dx and poss inj

## 2011-03-31 NOTE — Progress Notes (Signed)
Subjective:    Patient ID: Jennifer Bartlett, female    DOB: 24-Feb-1946, 65 y.o.   MRN: [id number]  HPI Is doing very well -- things are better at home with college student  Here for 3 mo f/u and now new shoulder pain L too  Pain over whole shoulder -to shoulder blade  No trauma -- ? If pulled when she lifted grandson  1 mo - betting worse  Most pain to move arm back in back of her , but can raise the arm No swelling or bruistin   HTN -- 118/70- excellent  No cp or palpitations or headache  No edema   Wt is down 4 more lb  Still doing so well with her wt loss  Continues to eat well but needs more exercise   Lab Results  Component Value Date   CHOL 185 12/16/2010   CHOL 174 12/07/2009   CHOL 160 05/22/2008   Lab Results  Component Value Date   HDL 54.00 12/16/2010   HDL 78.29 12/07/2009   HDL 56.2 05/22/2008   Lab Results  Component Value Date   LDLCALC 118* 12/16/2010   LDLCALC 93 12/07/2009   LDLCALC 91 05/22/2008   Lab Results  Component Value Date   TRIG 67.0 12/16/2010   TRIG 77.0 12/07/2009   TRIG 70 05/22/2008   Lab Results  Component Value Date   CHOLHDL 3 12/16/2010   CHOLHDL 3 12/07/2009   CHOLHDL 2.9 CALC 05/22/2008  was good in may  Now off zocor due to aches and pains (same rxn with lipitor)  Feels good and eating low sat fat diet   Patient Active Problem List  Diagnoses  . HERPES ZOSTER  . HYPOGLYCEMIA NOS  . VITAMIN B12 DEFICIENCY  . UNSPECIFIED VITAMIN D DEFICIENCY  . HYPERCHOLESTEROLEMIA  . OBESITY  . ANEMIA, IRON DEFICIENCY  . STRESS REACTION, ACUTE, WITH EMOTIONAL DISTURBANCE  . HYPERTENSION  . GERD  . Left shoulder pain   Past Medical History  Diagnosis Date  . GERD (gastroesophageal reflux disease)   . Hyperlipidemia   . Obesity   . Vitamin B 12 deficiency   . Hypoglycemia    Past Surgical History  Procedure Date  . Partial hysterectomy 1990's    bleeding  . Knee surgery     left  . 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 Hernia repair     History  Substance Use Topics  . Smoking status: Never Smoker   . Smokeless tobacco: Not on file  . Alcohol Use: Not on file   Family History  Problem Relation Age of Onset  . Heart disease Mother     MI  . Alcohol abuse Father   . Heart disease Father     smoker   Allergies  Allergen Reactions  . Lipitor (Atorvastatin Calcium)     Aches and pains  . Penicillins     REACTION: swelling   Current Outpatient Prescriptions on File Prior to Visit  Medication Sig Dispense Refill  . CALCIUM PO Take by mouth daily.        . cyanocobalamin (,VITAMIN B-12,) 1000 MCG/ML injection Inject 1,000 mcg into the muscle once. Every 2 months       . hydrochlorothiazide 25 MG tablet TAKE ONE TABLET BY MOUTH EVERY DAY  30 tablet  1  . Multiple Vitamin (MULTIVITAMIN) capsule Take 1 capsule by mouth daily.        . norethindrone-ethinyl estradiol-iron (MICROGESTIN FE 1.5/30) 1.5-30 MG-MCG tablet Take 1 tablet  by mouth daily.        . Omega-3 Fatty Acids (FISH OIL PO) Take by mouth daily.        Marland Kitchen omeprazole (PRILOSEC) 20 MG capsule TAKE ONE CAPSULE BY MOUTH EVERY DAY  90 capsule  0  . vitamin E 800 UNIT capsule Take 800 Units by mouth daily.        . ergocalciferol (VITAMIN D2) 50000 UNITS capsule Take 50,000 Units by mouth once a week. Times 10 weeks.               Review of Systems Review of Systems  Constitutional: Negative for fever, appetite change, fatigue and unexpected weight change.  Eyes: Negative for pain and visual disturbance.  Respiratory: Negative for cough and shortness of breath.   Cardiovascular: Negative for cp or palpitations    Gastrointestinal: Negative for nausea, diarrhea and constipation.  Genitourinary: Negative for urgency and frequency.  Skin: Negative for pallor or rash   Neurological: Negative for weakness, light-headedness, numbness and headaches.  Hematological: Negative for adenopathy. Does not bruise/bleed easily.  Psychiatric/Behavioral: Negative for  dysphoric mood. The patient is not nervous/anxious.          Objective:   Physical Exam  Constitutional: She appears well-developed and well-nourished.       overwt and well appearing  Continues to loose wt   HENT:  Head: Normocephalic and atraumatic.  Mouth/Throat: Oropharynx is clear and moist.  Eyes: Conjunctivae and EOM are normal. Pupils are equal, round, and reactive to light.  Neck: Normal range of motion. Neck supple. No JVD present. Carotid bruit is not present. Erythema present. No thyromegaly present.  Cardiovascular: Normal rate, regular rhythm, normal heart sounds and intact distal pulses.  Exam reveals no gallop.   Pulmonary/Chest: Effort normal and breath sounds normal. No respiratory distress. She has no wheezes.  Abdominal: Soft. Bowel sounds are normal. She exhibits no distension and no abdominal bruit. There is no tenderness.  Musculoskeletal: She exhibits tenderness. She exhibits no edema.       L shoulder - no edema or skin change No crepitance Acromion tenderness Pos hawking and neer tests  Pain on ext and int rotation Abduction  normal    Lymphadenopathy:    She has no cervical adenopathy.  Neurological: She is alert. She has normal reflexes. No cranial nerve deficit. Coordination normal.  Skin: Skin is warm and dry. No rash noted. No erythema. No pallor.  Psychiatric: She has a normal mood and affect.          Assessment & Plan:

## 2011-03-31 NOTE — Patient Instructions (Addendum)
Keep up the good work with diet and exercise Lets check cholesterol today off the zocor Avoid red meat/ fried foods/ egg yolks/ fatty breakfast meats/ butter, cheese and high fat dairy/ and shellfish   Make appt with Dr Patsy Lager at check out for shoulder  Follow up in 6 months for PE with labs prior

## 2011-04-07 ENCOUNTER — Other Ambulatory Visit: Payer: Self-pay

## 2011-04-07 ENCOUNTER — Telehealth: Payer: Self-pay

## 2011-04-07 MED ORDER — ROSUVASTATIN CALCIUM 5 MG PO TABS: 5.0000 mg | ORAL_TABLET | Freq: Every day | ORAL | Status: DC

## 2011-04-07 NOTE — Telephone Encounter (Signed)
Med sent electronically to [facility] as instructed. Fasting Lab appt scheduled as instructed 05/12/11 at 9 am.

## 2011-04-07 NOTE — Telephone Encounter (Signed)
Message copied by Patience Musca on Fri Apr 07, 2011  6:29 PM ------      Message from: Roxy Manns A      Created: Thu Apr 06, 2011  2:12 PM       crestor is really our best bet in terms of best efficacy with lowest side effects       Would start at crestor 5 mg       Please call in crestor 5 mg 1 po qd # 30 11 ref and update if side eff or - and please add to med list       sched fasting lab 6 wk for lipid/ast/alt

## 2011-04-07 NOTE — Telephone Encounter (Signed)
Patient notified as instructed by telephone. Fasting Lab appt scheduled as instructed 05/12/11 at 9 am. (pt going on cruise at 6 wk appt time) Med sent electronically to [facility] as instructed.

## 2011-04-10 ENCOUNTER — Encounter: Payer: Self-pay | Admitting: Family Medicine

## 2011-04-10 ENCOUNTER — Ambulatory Visit (INDEPENDENT_AMBULATORY_CARE_PROVIDER_SITE_OTHER): Payer: 59 | Admitting: Family Medicine

## 2011-04-10 VITALS — BP 130/80 | HR 58 | Temp 98.0°F | Ht 65.0 in | Wt 196.0 lb

## 2011-04-10 DIAGNOSIS — M7580 Other shoulder lesions, unspecified shoulder: Secondary | ICD-10-CM

## 2011-04-10 DIAGNOSIS — M25512 Pain in left shoulder: Secondary | ICD-10-CM

## 2011-04-10 DIAGNOSIS — M7500 Adhesive capsulitis of unspecified shoulder: Secondary | ICD-10-CM

## 2011-04-10 DIAGNOSIS — M719 Bursopathy, unspecified: Secondary | ICD-10-CM

## 2011-04-10 DIAGNOSIS — M25519 Pain in unspecified shoulder: Secondary | ICD-10-CM

## 2011-04-10 DIAGNOSIS — M67919 Unspecified disorder of synovium and tendon, unspecified shoulder: Secondary | ICD-10-CM

## 2011-04-10 NOTE — Patient Instructions (Signed)
REFERRAL: GO THE THE FRONT ROOM AT THE ENTRANCE OF OUR CLINIC, NEAR CHECK IN. ASK FOR MARION. SHE WILL HELP YOU SET UP YOUR REFERRAL. DATE: TIME:   Recheck in 6 weeks 

## 2011-04-10 NOTE — Progress Notes (Signed)
  Subjective:    Patient ID: Jennifer Bartlett, female    DOB: 11-10-1945, 65 y.o.   MRN: [id number]  HPI  [name], a 65 y.o. female presents today in the office for the following:    Left shoulder started to hurt about a month ago -- thought maybe pulled from carrying her grandson.  IROM hurts a lot.  Relatively diffuse.   The patient noted above presents with L shoulder pain that has been ongoing for 4-6 weeks there is no history of trauma or accident. The patient denies radicular symptoms. Some neck stiffness Denies dislocation, subluxation, separation of the shoulder. The patient does complain of pain in the overhead plane. Loss of IROM and abduction  Medications Tried: alleve, ibuprofen, tylenol Tried PT: No  Prior shoulder Injury: No Prior surgery: No Prior fracture: No   The PMH, PSH, Social History, Family History, Medications, and allergies have been reviewed in [facility], and have been updated if relevant.   Review of Systems REVIEW OF SYSTEMS  GEN: No fevers, chills. Nontoxic. Primarily MSK c/o today. MSK: Detailed in the HPI GI: tolerating PO intake without difficulty Neuro: No numbness, parasthesias, or tingling associated. Otherwise the pertinent positives of the ROS are noted above.      Objective:   Physical Exam   Physical Exam  Blood pressure 130/80, pulse 58, temperature 98 F (36.7 C), temperature source Oral, height 5\' 5"  (1.651 m), weight 196 lb (88.905 kg), SpO2 98.00%.  GEN: Well-developed,well-nourished,in no acute distress; alert,appropriate and cooperative throughout examination HEENT: Normocephalic and atraumatic without obvious abnormalities. Ears, externally no deformities PULM: Breathing comfortably in no respiratory distress EXT: No clubbing, cyanosis, or edema PSYCH: Normally interactive. Cooperative during the interview. Pleasant. Friendly and conversant. Not anxious or depressed appearing. Normal, full  affect.  Shoulder: Inspection: No muscle wasting or winging Ecchymosis/edema: neg  AC joint, scapula, clavicle: NT Cervical spine: NT, full ROM Spurling's: neg Abduction: 5/5 Flexion: 5/5 IR  4+/5 ER at neutral: full, 5/5 - but pain Loss of 15 deg abd Loss of 90 deg of IROM in 90 deg abd AC crossover: neg Neer: pos Hawkins: pos Drop Test: neg Empty Can: pos Supraspinatus insertion: mild-mod T Bicipital groove: NT Speed's: pos Yergason's: neg Sulcus sign: neg Scapular dyskinesis: none C5-T1 intact  Neuro: Sensation intact Grip 5/5       Assessment & Plan:   1. Left shoulder pain  Ambulatory referral to Physical Therapy  2. Rotator cuff tendonitis  Ambulatory referral to Physical Therapy  3. Adhesive capsulitis  Ambulatory referral to Physical Therapy    Diagnosis: adhesive capsulitis, early with probable secondary from cuff impingement  Patient was given a systematic ROM protocol from [facility] to be done daily. Emphasized that without adherence to HEP, likely will not get better.  Tylenol or NSAID of choice prn for pain relief  Patient will be sent for formal PT for aggressive frozen shoulder ROM. Will need RTC str and scapular stabilization to fix underlying mechanics.  SubAC Injection, LEFT Verbal consent was obtained from the patient. Risks, benefits, and alternatives were explained. Patient prepped with Betadine and Ethyl Chloride used for anesthesia. The subacromial space was injected using the posterior approach. The patient tolerated the procedure well and had decreased pain post injection. No complications. Injection: 9 cc of Lidocaine 1% and 1cc of Kenalog 40 mg. Needle: 22 gauge

## 2011-04-25 ENCOUNTER — Ambulatory Visit (INDEPENDENT_AMBULATORY_CARE_PROVIDER_SITE_OTHER): Payer: 59 | Admitting: *Deleted

## 2011-04-25 ENCOUNTER — Ambulatory Visit: Payer: 59

## 2011-04-25 DIAGNOSIS — E538 Deficiency of other specified B group vitamins: Secondary | ICD-10-CM

## 2011-04-25 DIAGNOSIS — Z23 Encounter for immunization: Secondary | ICD-10-CM

## 2011-04-25 MED ORDER — CYANOCOBALAMIN 1000 MCG/ML IJ SOLN
1000.0000 ug | Freq: Once | INTRAMUSCULAR | Status: AC
  Administered 2011-04-25: 1000 ug via INTRAMUSCULAR

## 2011-04-28 ENCOUNTER — Other Ambulatory Visit: Payer: Self-pay | Admitting: Gynecology

## 2011-04-28 DIAGNOSIS — Z1231 Encounter for screening mammogram for malignant neoplasm of breast: Secondary | ICD-10-CM

## 2011-05-05 ENCOUNTER — Ambulatory Visit: Payer: 59

## 2011-05-09 ENCOUNTER — Other Ambulatory Visit: Payer: Self-pay | Admitting: Family Medicine

## 2011-05-09 DIAGNOSIS — E7800 Pure hypercholesterolemia, unspecified: Secondary | ICD-10-CM

## 2011-05-10 ENCOUNTER — Ambulatory Visit: Payer: 59 | Admitting: Family Medicine

## 2011-05-12 ENCOUNTER — Other Ambulatory Visit: Payer: Self-pay | Admitting: Gynecology

## 2011-05-12 ENCOUNTER — Other Ambulatory Visit (INDEPENDENT_AMBULATORY_CARE_PROVIDER_SITE_OTHER): Payer: 59

## 2011-05-12 ENCOUNTER — Ambulatory Visit
Admission: RE | Admit: 2011-05-12 | Discharge: 2011-05-12 | Disposition: A | Discharge status: Home / Self Care | Payer: 59 | Source: Ambulatory Visit | Attending: Gynecology | Admitting: Gynecology

## 2011-05-12 DIAGNOSIS — Z1231 Encounter for screening mammogram for malignant neoplasm of breast: Secondary | ICD-10-CM

## 2011-05-12 DIAGNOSIS — E7800 Pure hypercholesterolemia, unspecified: Secondary | ICD-10-CM

## 2011-05-12 LAB — LIPID PANEL
LDL Cholesterol: 79 mg/dL (ref 0–99)
Total CHOL/HDL Ratio: 3

## 2011-05-26 ENCOUNTER — Ambulatory Visit
Admission: RE | Admit: 2011-05-26 | Discharge: 2011-05-26 | Disposition: A | Discharge status: Home / Self Care | Payer: 59 | Source: Ambulatory Visit | Attending: Gynecology | Admitting: Gynecology

## 2011-05-26 DIAGNOSIS — Z1231 Encounter for screening mammogram for malignant neoplasm of breast: Secondary | ICD-10-CM

## 2011-05-30 ENCOUNTER — Ambulatory Visit: Payer: 59 | Admitting: Family Medicine

## 2011-05-30 DIAGNOSIS — Z0289 Encounter for other administrative examinations: Secondary | ICD-10-CM

## 2011-06-06 ENCOUNTER — Other Ambulatory Visit: Payer: Self-pay | Admitting: Women's Health

## 2011-06-07 ENCOUNTER — Encounter: Payer: Self-pay | Admitting: *Deleted

## 2011-06-09 ENCOUNTER — Ambulatory Visit (INDEPENDENT_AMBULATORY_CARE_PROVIDER_SITE_OTHER): Payer: 59 | Admitting: Women's Health

## 2011-06-09 ENCOUNTER — Other Ambulatory Visit (HOSPITAL_COMMUNITY)
Admission: RE | Admit: 2011-06-09 | Discharge: 2011-06-09 | Disposition: A | Discharge status: Home / Self Care | Payer: 59 | Source: Ambulatory Visit | Attending: Women's Health | Admitting: Women's Health

## 2011-06-09 ENCOUNTER — Encounter: Payer: Self-pay | Admitting: Women's Health

## 2011-06-09 VITALS — BP 120/80 | Ht 64.5 in | Wt 191.0 lb

## 2011-06-09 DIAGNOSIS — R82998 Other abnormal findings in urine: Secondary | ICD-10-CM

## 2011-06-09 DIAGNOSIS — Z01419 Encounter for gynecological examination (general) (routine) without abnormal findings: Secondary | ICD-10-CM

## 2011-06-09 DIAGNOSIS — N390 Urinary tract infection, site not specified: Secondary | ICD-10-CM

## 2011-06-09 DIAGNOSIS — Z780 Asymptomatic menopausal state: Secondary | ICD-10-CM

## 2011-06-09 DIAGNOSIS — N951 Menopausal and female climacteric states: Secondary | ICD-10-CM

## 2011-06-09 MED ORDER — ESTROPIPATE 1.5 MG PO TABS: 0.7500 mg | ORAL_TABLET | Freq: Every day | ORAL | Status: DC

## 2011-06-09 NOTE — Progress Notes (Signed)
Jennifer Bartlett 1946/07/01 [id number]    History:    The patient presents for annual exam.  Retired, traveling.   Past medical history, past surgical history, family history and social history were all reviewed and documented in the EPIC chart.   ROS:  A  ROS was performed and pertinent positives and negatives are included in the history.  Exam:  Filed Vitals:   06/09/11 0902  BP: 120/80    General appearance:  Normal Head/Neck:  Normal, without cervical or supraclavicular adenopathy. Thyroid:  Symmetrical, normal in size, without palpable masses or nodularity. Respiratory  Effort:  Normal  Auscultation:  Clear without wheezing or rhonchi Cardiovascular  Auscultation:  Regular rate, without rubs, murmurs or gallops  Edema/varicosities:  Not grossly evident Abdominal  Soft,nontender, without masses, guarding or rebound.  Liver/spleen:  No organomegaly noted  Hernia:  None appreciated  Skin  Inspection:  Grossly normal  Palpation:  Grossly normal Neurologic/psychiatric  Orientation:  Normal with appropriate conversation.  Mood/affect:  Normal  Genitourinary    Breasts: Examined lying and sitting.     Right: Without masses, retractions, discharge or axillary adenopathy.     Left: Without masses, retractions, discharge or axillary adenopathy.   Inguinal/mons:  Normal without inguinal adenopathy  External genitalia:  Normal  BUS/Urethra/Skene's glands:  Normal  Bladder:  Normal  Vagina:  Normal  Cervix:  Absent   Uterus:  absent  Adnexa/parametria:     Rt: Without masses or tenderness.   Lt: Without masses or tenderness.  Anus and perineum: Normal  Digital rectal exam: Normal sphincter tone without palpated masses or tenderness  Assessment/Plan:  65 y.o.MWF G2P2  for annual exam. Tried to stop estrogen, states numerous hot flushes, poor sleep and states just did not feel well. History of gastric bypass, unable to have a colonoscopy, primary care does testing. Has  had normal mammograms and normal Paps. Normal DEXA, primary care manages. Had zostovac, will have Pneumovax when 66. Hysterectomy in 89/ menorrhagia.  Hypertension/ hyperlipidemia-primary care labs and meds Postmenopausal on ERT  Plan:UA, Pap. Estradiol 1.5 mg by mouth daily prescription, proper use, slight risk for blood clots strokes was given and reviewed. Did review stopping since she's been on greater than 5 years, but states when off numerous hot flushes. SBEs, annual mammogram which have been normal, encouraged increase regular exercise, vitamin D 2000 daily encouraged. History of a normal DEXA at breast center will schedule.       Harrington Challenger [facility], 9:35 AM 06/09/2011

## 2011-06-12 ENCOUNTER — Other Ambulatory Visit: Payer: Self-pay | Admitting: Family Medicine

## 2011-06-13 ENCOUNTER — Other Ambulatory Visit: Payer: Self-pay | Admitting: *Deleted

## 2011-06-13 MED ORDER — SULFAMETHOXAZOLE-TMP DS 800-160 MG PO TABS: 1.0000 | ORAL_TABLET | Freq: Two times a day (BID) | ORAL | Status: AC

## 2011-06-13 MED ORDER — OMEPRAZOLE 20 MG PO CPDR: DELAYED_RELEASE_CAPSULE | ORAL | Status: DC

## 2011-06-13 NOTE — Progress Notes (Signed)
Addended by: Venora Maples on: 06/13/2011 12:39 PM   Modules accepted: Orders

## 2011-07-10 ENCOUNTER — Other Ambulatory Visit: Payer: Self-pay | Admitting: Women's Health

## 2011-07-14 ENCOUNTER — Other Ambulatory Visit: Payer: Self-pay | Admitting: Women's Health

## 2011-07-14 ENCOUNTER — Other Ambulatory Visit: Payer: Self-pay

## 2011-07-14 MED ORDER — ROSUVASTATIN CALCIUM 5 MG PO TABS: 5.0000 mg | ORAL_TABLET | Freq: Every day | ORAL | Status: DC

## 2011-07-14 NOTE — Telephone Encounter (Signed)
Pt request Crestor rx be changed to a 3 month prescription and pharmacy changed to [address] instead of [facility]. Crestor 5 mg # 90 x2 sent to [address]. Patient notified as instructed by telephone. Pt said that she is having severe leg pain and wonders if could be related to Crestor. Pt wants appt to see Dr Milinda Antis next week. Pt scheduled appt 07/19/11 at 9am. Pt advised to call back if pain worsens.

## 2011-07-15 ENCOUNTER — Other Ambulatory Visit: Payer: Self-pay | Admitting: Women's Health

## 2011-07-17 NOTE — Telephone Encounter (Signed)
Please call patient and instructed come in for UA/office visit. Best to check urine prior to prescribing.

## 2011-07-17 NOTE — Telephone Encounter (Signed)
Patient informed.  Transferred to appointments to set up.

## 2011-07-19 ENCOUNTER — Ambulatory Visit (INDEPENDENT_AMBULATORY_CARE_PROVIDER_SITE_OTHER): Payer: 59 | Admitting: Family Medicine

## 2011-07-19 ENCOUNTER — Encounter: Payer: Self-pay | Admitting: Family Medicine

## 2011-07-19 VITALS — BP 132/74 | HR 60 | Temp 97.9°F | Ht 64.5 in | Wt 194.5 lb

## 2011-07-19 DIAGNOSIS — E7800 Pure hypercholesterolemia, unspecified: Secondary | ICD-10-CM

## 2011-07-19 DIAGNOSIS — M791 Myalgia, unspecified site: Secondary | ICD-10-CM

## 2011-07-19 DIAGNOSIS — E538 Deficiency of other specified B group vitamins: Secondary | ICD-10-CM

## 2011-07-19 DIAGNOSIS — IMO0001 Reserved for inherently not codable concepts without codable children: Secondary | ICD-10-CM

## 2011-07-19 LAB — CK: Total CK: 53 U/L (ref 7–177)

## 2011-07-19 MED ORDER — CYANOCOBALAMIN 1000 MCG/ML IJ SOLN
1000.0000 ug | Freq: Once | INTRAMUSCULAR | Status: AC
  Administered 2011-07-19: 1000 ug via INTRAMUSCULAR

## 2011-07-19 NOTE — Assessment & Plan Note (Signed)
Intolerant of 3 statins, now crestor Stressed imp of low sat fat diet  When myalgias improve-may disc alt tx like welchol if chol still very high  cpk today

## 2011-07-19 NOTE — Progress Notes (Signed)
Subjective:    Patient ID: Jennifer Bartlett, female    DOB: 25-Aug-1945, 65 y.o.   MRN: [id number]  HPI Here with leg pain- concern for crestor side eff Feeling rotton  Cannot even take walks or climb stairs due to pain  Taking acetaminophen every 4 hours  Stopped her crestor -- last weekend  No improvement yet  Some pain in upper arm muscles too , along with buttocks/ hips  No fever or rash No joint swelling    Hx of high chol Intol of lipitor and zocor  Lab Results  Component Value Date   CHOL 154 05/12/2011   CHOL 212* 03/31/2011   CHOL 185 12/16/2010   Lab Results  Component Value Date   HDL 60.40 05/12/2011   HDL 53.90 03/31/2011   HDL 78.29 12/16/2010   Lab Results  Component Value Date   LDLCALC 79 05/12/2011   LDLCALC 118* 12/16/2010   LDLCALC 93 12/07/2009   Lab Results  Component Value Date   TRIG 75.0 05/12/2011   TRIG 88.0 03/31/2011   TRIG 67.0 12/16/2010   Lab Results  Component Value Date   CHOLHDL 3 05/12/2011   CHOLHDL 4 03/31/2011   CHOLHDL 3 12/16/2010   Lab Results  Component Value Date   LDLDIRECT 151.5 03/31/2011    Patient Active Problem List  Diagnoses  . HERPES ZOSTER  . HYPOGLYCEMIA NOS  . VITAMIN B12 DEFICIENCY  . UNSPECIFIED VITAMIN D DEFICIENCY  . HYPERCHOLESTEROLEMIA  . OBESITY  . ANEMIA, IRON DEFICIENCY  . STRESS REACTION, ACUTE, WITH EMOTIONAL DISTURBANCE  . HYPERTENSION  . GERD  . Left shoulder pain  . Myalgia   Past Medical History  Diagnosis Date  . GERD (gastroesophageal reflux disease)   . Hyperlipidemia   . Obesity   . Vitamin B 12 deficiency   . Hypoglycemia   . Hypertension   . Anemia    Past Surgical History  Procedure Date  . Partial hysterectomy 1990's    bleeding  . Knee surgery     left  . 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 Hernia repair   . Abdominal hysterectomy    History  Substance Use Topics  . Smoking status: Never Smoker   . Smokeless tobacco: Never Used  . Alcohol Use: No   Family History    Problem Relation Age of Onset  . Heart disease Mother     MI  . Hypertension Mother   . Alcohol abuse Father   . Heart disease Father     smoker  . Hypertension Father   . Breast cancer Paternal Grandmother   . Diabetes Maternal Aunt    Allergies  Allergen Reactions  . Crestor (Rosuvastatin Calcium)     Myalgia   . Lipitor (Atorvastatin Calcium)     Aches and pains  . Penicillins     REACTION: swelling  . Zocor (Simvastatin - High Dose) Other (See Comments)    Muscle/joint aches   Current Outpatient Prescriptions on File Prior to Visit  Medication Sig Dispense Refill  . CALCIUM PO Take by mouth daily.        . cholecalciferol (VITAMIN D) 400 UNITS TABS Take by mouth. 2 qd        . cyanocobalamin (,VITAMIN B-12,) 1000 MCG/ML injection Inject 1,000 mcg into the muscle once. Every 2 months       . ergocalciferol (VITAMIN D2) 50000 UNITS capsule Take 50,000 Units by mouth once a week. Times 10 weeks.       Marland Kitchen  estropipate (OGEN) 1.5 MG tablet Take 0.5 tablets (0.75 mg total) by mouth daily.  30 tablet  12  . hydrochlorothiazide (HYDRODIURIL) 25 MG tablet TAKE ONE TABLET BY MOUTH EVERY DAY  90 tablet  2  . Multiple Vitamin (MULTIVITAMIN) capsule Take 1 capsule by mouth daily.        . Omega-3 Fatty Acids (FISH OIL PO) Take by mouth daily.        Marland Kitchen omeprazole (PRILOSEC) 20 MG capsule Take one by mouth daily  90 capsule  3  . vitamin E 800 UNIT capsule Take 800 Units by mouth daily.        Marland Kitchen zinc gluconate 50 MG tablet Take 50 mg by mouth daily.         No current facility-administered medications on file prior to visit.     Review of Systems Review of Systems  Constitutional: Negative for fever, appetite change, fatigue and unexpected weight change.  Eyes: Negative for pain and visual disturbance.  Respiratory: Negative for cough and shortness of breath.   Cardiovascular: Negative for cp or palpitations    Gastrointestinal: Negative for nausea, diarrhea and constipation.   Genitourinary: Negative for urgency and frequency.  Skin: Negative for pallor or rash   MSK pos for diffuse muscle pain  Neurological: Negative for weakness, light-headedness, numbness and pos for occ migraine (not temporal) Hematological: Negative for adenopathy. Does not bruise/bleed easily.  Psychiatric/Behavioral: Negative for dysphoric mood. The patient is not nervous/anxious.  (lot of stress in family however)        Objective:   Physical Exam  Constitutional: She appears well-developed and well-nourished. No distress.       overwt and well appearing   HENT:  Head: Normocephalic and atraumatic.  Mouth/Throat: Oropharynx is clear and moist.  Eyes: Conjunctivae and EOM are normal. Pupils are equal, round, and reactive to light. No scleral icterus.  Neck: Normal range of motion. Neck supple. No JVD present. No thyromegaly present.  Cardiovascular: Normal rate, regular rhythm, normal heart sounds and intact distal pulses.   Pulmonary/Chest: Effort normal and breath sounds normal. No respiratory distress. She has no wheezes.  Musculoskeletal: She exhibits tenderness. She exhibits no edema.       Diffuse tenderness- myofasical - back / arms/ legs No joint swelling/ tenderness Nl rom all joints No edema   Lymphadenopathy:    She has no cervical adenopathy.  Neurological: She is alert. She has normal reflexes. No cranial nerve deficit. She exhibits normal muscle tone. Coordination normal.  Skin: Skin is warm and dry. No rash noted. No erythema. No pallor.  Psychiatric: She has a normal mood and affect.          Assessment & Plan:

## 2011-07-19 NOTE — Assessment & Plan Note (Signed)
Strongly suspect that this is due to crestor Just stopped it  cpk and esr lab today given symptoms Will update if not imp in 1-2 wk  Or if worse

## 2011-07-19 NOTE — Patient Instructions (Signed)
Avoid red meat/ fried foods/ egg yolks/ fatty breakfast meats/ butter, cheese and high fat dairy/ and shellfish  (since you cannot take cholesterol medicines )  Stay off the crestor  Labs today- will update you  Let me know if you worsen or do not start to improve in 1 week

## 2011-07-21 ENCOUNTER — Telehealth: Payer: Self-pay | Admitting: *Deleted

## 2011-07-21 ENCOUNTER — Other Ambulatory Visit: Payer: 59 | Admitting: *Deleted

## 2011-07-21 DIAGNOSIS — N390 Urinary tract infection, site not specified: Secondary | ICD-10-CM

## 2011-07-21 NOTE — Telephone Encounter (Signed)
Ok - we just stopped the crestor anyway due to myalgias

## 2011-07-21 NOTE — Telephone Encounter (Signed)
We received a fax from [facility] re a recent prior auth attempt for pt. The fax states that Crestor does not require prior auth it just needs to be a 90 day rx filled by the mail order company [facility] instead of local pharmacy.

## 2011-07-28 ENCOUNTER — Telehealth: Payer: Self-pay | Admitting: Internal Medicine

## 2011-07-28 NOTE — Telephone Encounter (Signed)
Left v/m as instructed but also requested pt to call back to verify got message and to make appt.

## 2011-07-28 NOTE — Telephone Encounter (Signed)
I rev her labs at last visit-re assuring - but given she has not improved would like to see her and do more labwork -when able  For now use tylenol , and if pain becomes severe or any fever update me

## 2011-07-28 NOTE — Telephone Encounter (Signed)
Patient states she stopped the crestor x3 weeks  but she is still aching all over.  Would like to know your suggestions.  She won't be available after 2 and the 6th of January but she stated you could leave her a voice message.

## 2011-08-03 NOTE — Telephone Encounter (Signed)
Left vm for pt to callback 

## 2011-08-04 NOTE — Telephone Encounter (Signed)
Left vm for pt to callback 

## 2011-08-08 NOTE — Telephone Encounter (Signed)
Left vm for pt to callback 

## 2011-08-08 NOTE — Telephone Encounter (Signed)
Made an appointment for patient as requested Friday at 4.

## 2011-08-11 ENCOUNTER — Ambulatory Visit (INDEPENDENT_AMBULATORY_CARE_PROVIDER_SITE_OTHER): Payer: 59 | Admitting: Family Medicine

## 2011-08-11 ENCOUNTER — Encounter: Payer: Self-pay | Admitting: Family Medicine

## 2011-08-11 VITALS — BP 138/74 | HR 76 | Temp 97.9°F | Ht 64.5 in | Wt 193.8 lb

## 2011-08-11 DIAGNOSIS — M7060 Trochanteric bursitis, unspecified hip: Secondary | ICD-10-CM

## 2011-08-11 DIAGNOSIS — M2550 Pain in unspecified joint: Secondary | ICD-10-CM

## 2011-08-11 DIAGNOSIS — E7800 Pure hypercholesterolemia, unspecified: Secondary | ICD-10-CM

## 2011-08-11 DIAGNOSIS — M791 Myalgia, unspecified site: Secondary | ICD-10-CM

## 2011-08-11 DIAGNOSIS — M76899 Other specified enthesopathies of unspecified lower limb, excluding foot: Secondary | ICD-10-CM

## 2011-08-11 DIAGNOSIS — IMO0001 Reserved for inherently not codable concepts without codable children: Secondary | ICD-10-CM

## 2011-08-11 LAB — CBC WITH DIFFERENTIAL/PLATELET
Eosinophils Absolute: 0.2 10*3/uL (ref 0.0–0.7)
Eosinophils Relative: 3 % (ref 0–5)
Hemoglobin: 12.1 g/dL (ref 12.0–15.0)
Lymphs Abs: 3 10*3/uL (ref 0.7–4.0)
MCH: 30.1 pg (ref 26.0–34.0)
MCV: 91.5 fL (ref 78.0–100.0)
Monocytes Absolute: 0.5 10*3/uL (ref 0.1–1.0)
Monocytes Relative: 8 % (ref 3–12)
RBC: 4.02 MIL/uL (ref 3.87–5.11)

## 2011-08-11 NOTE — Assessment & Plan Note (Addendum)
Not imp off statin  No weakness at all Check lab again - cpk and esr PMR needs to be ruled out Overall improved in arms, however  >25 min spent with face to face with patient, >50% counseling and/or coordinating care- for this and cholesterol

## 2011-08-11 NOTE — Assessment & Plan Note (Signed)
Persists after stopping statin Autoimmune labs today  Then update  Also suspect troch bursitis- will send to Dr Patsy Lager

## 2011-08-11 NOTE — Assessment & Plan Note (Signed)
Tender bilaterally-where most of her pain is Unrelieved with aleve(disc proper dosing of this) Ref to Dr Patsy Lager - ? If would benefit from injections

## 2011-08-11 NOTE — Assessment & Plan Note (Signed)
Off statin now but still having pain Will work that up further and then make a plan for cholesterol

## 2011-08-11 NOTE — Progress Notes (Signed)
Subjective:    Patient ID: Jennifer Bartlett, female    DOB: 06-14-1946, 66 y.o.   MRN: [id number]  HPI  Here for f/u of leg and arm pain -- ? Due to chol med  Stopped crestor back in December  Is not really getting better - even worse  Legs and arms are all very much hurting   Pain from hip to knee in legs- worse - and can barely walk  Arms a bit better   2 aleve - [place]  Did a [place] of walking   Lab Results  Component Value Date   CKTOTAL 53 07/19/2011   Lab Results  Component Value Date   ESRSEDRATE 24* 07/19/2011    Wt is down and she is generally in good shape   Patient Active Problem List  Diagnoses  . HERPES ZOSTER  . HYPOGLYCEMIA NOS  . VITAMIN B12 DEFICIENCY  . UNSPECIFIED VITAMIN D DEFICIENCY  . HYPERCHOLESTEROLEMIA  . OBESITY  . ANEMIA, IRON DEFICIENCY  . STRESS REACTION, ACUTE, WITH EMOTIONAL DISTURBANCE  . HYPERTENSION  . GERD  . Left shoulder pain  . Myalgia  . Joint pain  . Trochanteric bursitis   Past Medical History  Diagnosis Date  . GERD (gastroesophageal reflux disease)   . Hyperlipidemia   . Obesity   . Vitamin B 12 deficiency   . Hypoglycemia   . Hypertension   . Anemia    Past Surgical History  Procedure Date  . Partial hysterectomy 1990's    bleeding  . Knee surgery     left  . 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 Hernia repair   . Abdominal hysterectomy    History  Substance Use Topics  . Smoking status: Never Smoker   . Smokeless tobacco: Never Used  . Alcohol Use: No   Family History  Problem Relation Age of Onset  . Heart disease Mother     MI  . Hypertension Mother   . Alcohol abuse Father   . Heart disease Father     smoker  . Hypertension Father   . Breast cancer Paternal Grandmother   . Diabetes Maternal Aunt    Allergies  Allergen Reactions  . Crestor (Rosuvastatin Calcium)     Myalgia   . Lipitor (Atorvastatin Calcium)     Aches and pains  . Penicillins    REACTION: swelling  . Zocor (Simvastatin - High Dose) Other (See Comments)    Muscle/joint aches   Current Outpatient Prescriptions on File Prior to Visit  Medication Sig Dispense Refill  . CALCIUM PO Take by mouth daily.        . cholecalciferol (VITAMIN D) 400 UNITS TABS Take by mouth. 2 qd        . cyanocobalamin (,VITAMIN B-12,) 1000 MCG/ML injection Inject 1,000 mcg into the muscle once. Every 2 months       . estropipate (OGEN) 1.5 MG tablet Take 0.5 tablets (0.75 mg total) by mouth daily.  30 tablet  12  . hydrochlorothiazide (HYDRODIURIL) 25 MG tablet TAKE ONE TABLET BY MOUTH EVERY DAY  90 tablet  2  . Multiple Vitamin (MULTIVITAMIN) capsule Take 1 capsule by mouth daily.        . Omega-3 Fatty Acids (FISH OIL PO) Take by mouth daily.        Marland Kitchen omeprazole (PRILOSEC) 20 MG capsule Take one by mouth daily  90 capsule  3  . vitamin E 800  UNIT capsule Take 800 Units by mouth daily.        Marland Kitchen zinc gluconate 50 MG tablet Take 50 mg by mouth daily.        . ergocalciferol (VITAMIN D2) 50000 UNITS capsule Take 50,000 Units by mouth once a week. Times 10 weeks.              Review of Systems Review of Systems  Constitutional: Negative for fever, appetite change, fatigue and unexpected weight change.  Eyes: Negative for pain and visual disturbance.  Respiratory: Negative for cough and shortness of breath.   Cardiovascular: Negative for cp or palpitations    Gastrointestinal: Negative for nausea, diarrhea and constipation.  Genitourinary: Negative for urgency and frequency.  Skin: Negative for pallor or rash   MSK pos for muscle and joint pain , neg for joint swelling or redness  Neurological: Negative for weakness, light-headedness, numbness and headaches.  Hematological: Negative for adenopathy. Does not bruise/bleed easily.  Psychiatric/Behavioral: Negative for dysphoric mood. The patient is not nervous/anxious.          Objective:   Physical Exam  Constitutional: She appears  well-developed and well-nourished. No distress.       overwt and well appearing   HENT:  Head: Normocephalic and atraumatic.  Mouth/Throat: Oropharynx is clear and moist.  Eyes: Conjunctivae and EOM are normal. Pupils are equal, round, and reactive to light.  Neck: Normal range of motion. Neck supple. No tracheal deviation present. No thyromegaly present.  Cardiovascular: Normal rate, regular rhythm, normal heart sounds and intact distal pulses.  Exam reveals no gallop.   Pulmonary/Chest: Effort normal and breath sounds normal. No respiratory distress. She has no wheezes.  Abdominal: Soft. Bowel sounds are normal. She exhibits no distension. There is no tenderness.  Musculoskeletal: Normal range of motion. She exhibits tenderness.       Nl rom of all limbs with some pain  Diffuse tenderness over myofascial areas of arms but not back  Marked greater trochanter tenderness  Nl SLR and nl hip rom Gait is labored from stiffness and soreness No joint swelling/ redness or warmth  Lymphadenopathy:    She has no cervical adenopathy.  Neurological: She is alert. She displays no tremor. No cranial nerve deficit. She exhibits normal muscle tone. Coordination normal.  Skin: Skin is warm and dry. No rash noted. No erythema. No pallor.  Psychiatric: She has a normal mood and affect.          Assessment & Plan:

## 2011-08-11 NOTE — Patient Instructions (Signed)
Labs today I think you may have some bursitis in hips Make appt with Dr Patsy Lager when you check out  Will make plan when labs return

## 2011-08-18 ENCOUNTER — Ambulatory Visit: Payer: 59

## 2011-08-24 ENCOUNTER — Ambulatory Visit (INDEPENDENT_AMBULATORY_CARE_PROVIDER_SITE_OTHER): Payer: 59 | Admitting: Family Medicine

## 2011-08-24 ENCOUNTER — Encounter: Payer: Self-pay | Admitting: Family Medicine

## 2011-08-24 ENCOUNTER — Ambulatory Visit: Payer: 59

## 2011-08-24 VITALS — BP 130/80 | HR 70 | Temp 98.6°F | Ht 64.5 in | Wt 193.4 lb

## 2011-08-24 DIAGNOSIS — M549 Dorsalgia, unspecified: Secondary | ICD-10-CM

## 2011-08-24 DIAGNOSIS — M76899 Other specified enthesopathies of unspecified lower limb, excluding foot: Secondary | ICD-10-CM

## 2011-08-24 DIAGNOSIS — E538 Deficiency of other specified B group vitamins: Secondary | ICD-10-CM

## 2011-08-24 DIAGNOSIS — M7060 Trochanteric bursitis, unspecified hip: Secondary | ICD-10-CM

## 2011-08-24 MED ORDER — CYANOCOBALAMIN 1000 MCG/ML IJ SOLN
1000.0000 ug | Freq: Once | INTRAMUSCULAR | Status: AC
  Administered 2011-08-24: 1000 ug via INTRAMUSCULAR

## 2011-08-24 NOTE — Patient Instructions (Signed)
Trochanteric Bursa Rehab  Hip Abduction - build to 3 sets of 30 and then add weights begin with no weight. When 3 x 30 reached, Add 2 lb. ankle weight. Increase to 3,4,5,6 when 3x30 achieved.  [email or web address]/videos  Google "[facility]" -- can do any of the hip section  Recheck in 6 weeks

## 2011-08-24 NOTE — Progress Notes (Signed)
Patient Name: Jennifer Bartlett Date of Birth: 1945/12/05 Age: 66 y.o. Medical Record Number: [id number] Gender: female Date of Encounter: 08/24/2011  History of Present Illness:  Jennifer Bartlett is a 66 y.o. very pleasant female patient who presents with the following:  When she was placed on Crestor, had some myalgias, and had some difficulty with walking. Had a lot of myalgia and some difficulty walking and pain a lot on the side.   She is having her greatest pain in her bilateral lateral hips.. Denies any specific groin pain. She denies any specific trauma or injury.  For a few months she has been having some diffuse myalgias, but she associates with statin use.  She also has been having some back pain, which is relatively long-standing. She weighs 194 pounds. She has lost greater than 100 pounds in the distant past. Lateral hips have been hurting for a couple of months.   She has not noticed any specific weakness, numbness. No tingling in her distal extremities. No bowel or bladder incontinence. She also has some mild heel irritation on one side as well. She also complains of some minimal knee pain right now.  Past Medical History, Surgical History, Social History, Family History, Problem List, Medications, and Allergies have been reviewed and updated if relevant.  Review of Systems:  GEN: No fevers, chills. Nontoxic. Primarily MSK c/o today. MSK: Detailed in the HPI GI: tolerating PO intake without difficulty Neuro: No numbness, parasthesias, or tingling associated. Otherwise the pertinent positives of the ROS are noted above.    Physical Examination: Filed Vitals:   08/24/11 1521  BP: 130/80  Pulse: 70  Temp: 98.6 F (37 C)  TempSrc: Oral  Height: 5' 4.5" (1.638 m)  Weight: 193 lb 6.4 oz (87.726 kg)  SpO2: 98%    Body mass index is 32.68 kg/(m^2).   GEN: WDWN, NAD, Non-toxic, Alert & Oriented x 3 HEENT: Atraumatic, Normocephalic.  Ears and Nose: No external  deformity. EXTR: No clubbing/cyanosis/edema NEURO: Normal gait.  PSYCH: Normally interactive. Conversant. Not depressed or anxious appearing.  Calm demeanor.   HIP EXAM: SIDE: B  ROM: Abduction, Flexion, Internal and External range of motion: excellent and full in a log roll position as well at this in the hip flexed to 90. Pain with terminal IROM and EROM: none GTB: markedly tender bilaterally SLR: NEG Knees: No effusion FABER: NT REVERSE FABER: pos Piriformis: tender Str: flexion: 4+/5 abduction: 3+/5 B adduction: 4+/5 Strength testing causes some mild tenderness  Moderately tender in the L4-L5 region in the paraspinous as well as erector spinae complex. Range of motion is good at the waist. Mild to moderate tenderness at the SI joint. Moderate tenderness in the posterior buttocks region diffusely around the quadratus and Piriformis region  Assessment and Plan: 1. Back pain    2. B12 deficiency  cyanocobalamin ((VITAMIN B-12)) injection 1,000 mcg  3. Trochanteric bursitis      Unclear what was ultimately the initial primary cause. Could be primary back pain versus some alteration of gait due to myalgias from statins. Nevertheless, the patient is dramatically weak in her hip stabilizers this point, and she has some secondary significant trochanteric bursitis.  We reviewed some videos for hip stabilization and core strengthening, and she is going to work on these daily. Trochanteric bursa injections bilaterally today and recheck in 6 weeks   Trochanteric Bursitis Injection, RIGHT Verbal consent obtained. Risks (including infection, potential atrophy), benefits, and alternatives reviewed. Greater trochanter sterilely prepped with Chloraprep.  Ethyl Chloride used for anesthesia. 8 cc of Lidocaine 1% injected with 2 cc of 40 mg Depo-Medrol into trochanteric bursa at area of maximal tenderness at greater trochanter. Needle taken to bone to troch bursa, flows easily. Bursa massaged. No  bleeding and no complications. Decreased pain after injection. Needle: 22 gauge spinal needle  Trochanteric Bursitis Injection, LEFT Verbal consent obtained. Risks (including infection, potential atrophy), benefits, and alternatives reviewed. Greater trochanter sterilely prepped with Chloraprep. Ethyl Chloride used for anesthesia. 8 cc of Lidocaine 1% injected with 2 cc of 40 mg Depo-Medrol into trochanteric bursa at area of maximal tenderness at greater trochanter. Needle taken to bone to troch bursa, flows easily. Bursa massaged. No bleeding and no complications. Decreased pain after injection. Needle: 22 gauge spinal needle

## 2011-09-22 ENCOUNTER — Ambulatory Visit: Payer: 59 | Admitting: Family Medicine

## 2011-09-26 ENCOUNTER — Ambulatory Visit: Payer: 59

## 2011-10-11 ENCOUNTER — Ambulatory Visit (INDEPENDENT_AMBULATORY_CARE_PROVIDER_SITE_OTHER): Payer: 59 | Admitting: Family Medicine

## 2011-10-11 ENCOUNTER — Encounter: Payer: Self-pay | Admitting: Family Medicine

## 2011-10-11 VITALS — BP 130/80 | HR 63 | Temp 98.7°F | Wt 196.2 lb

## 2011-10-11 DIAGNOSIS — M549 Dorsalgia, unspecified: Secondary | ICD-10-CM

## 2011-10-11 DIAGNOSIS — M76899 Other specified enthesopathies of unspecified lower limb, excluding foot: Secondary | ICD-10-CM

## 2011-10-11 DIAGNOSIS — M7060 Trochanteric bursitis, unspecified hip: Secondary | ICD-10-CM

## 2011-10-12 NOTE — Progress Notes (Signed)
  Patient Name: Jennifer Bartlett Date of Birth: 1945/11/10 Age: 66 y.o. Medical Record Number: [id number] Gender: female Date of Encounter: 10/11/2011  History of Present Illness:  Jennifer Bartlett is a 66 y.o. very pleasant female patient who presents with the following:  A pleasant patient who I remember well who I saw last office visit who had some severe trochanteric bursitis and also has had some long-standing back pain issues. She has had some difficulty with her weight, and was quite weak in her hip flexors and particularly her hip abductors.  Our last office visit, we did bilateral trochanteric bursa injections and started her on some rehabilitation exercises. Her strength is improved. She was completely pain free for 3-4 weeks, and now has some mild return of her pain, but she is dramatically improved compared to her prior office visit.  Past Medical History, Surgical History, Social History, Family History, Problem List, Medications, and Allergies have been reviewed and updated if relevant.  Review of Systems:  GEN: No fevers, chills. Nontoxic. Primarily MSK c/o today. MSK: Detailed in the HPI GI: tolerating PO intake without difficulty Neuro: No numbness, parasthesias, or tingling associated. Otherwise the pertinent positives of the ROS are noted above.    Physical Examination: Filed Vitals:   10/11/11 1526  BP: 130/80  Pulse: 63  Temp: 98.7 F (37.1 C)  TempSrc: Oral  Weight: 196 lb 4 oz (89.018 kg)    There is no height on file to calculate BMI.   GEN: WDWN, NAD, Non-toxic, Alert & Oriented x 3 HEENT: Atraumatic, Normocephalic.  Ears and Nose: No external deformity. EXTR: No clubbing/cyanosis/edema NEURO: Normal gait.  PSYCH: Normally interactive. Conversant. Not depressed or anxious appearing.  Calm demeanor.   HIP EXAM: SIDE: b ROM: Abduction, Flexion, Internal and External range of motion: full Pain with terminal IROM and EROM: no GTB: NT SLR:  NEG Knees: No effusion FABER: NT REVERSE FABER: NT, neg Piriformis: NT at direct palpation Str: flexion: 4++/5 abduction: 4+/5 adduction: 5/5 Strength testing non-tender     Assessment and Plan:  1. Trochanteric bursitis   2. Back pain     Much better, encourage her to keep working on her strengthening exercises. She is going to follow up only on a p.r.n. Basis.  alleve 2 tabs po bid. May add in rarely 1-2 ibuprofen tabs on top of this bid. Take prilosec daily for stomach protection

## 2011-10-13 ENCOUNTER — Ambulatory Visit (INDEPENDENT_AMBULATORY_CARE_PROVIDER_SITE_OTHER): Payer: 59 | Admitting: Family Medicine

## 2011-10-13 ENCOUNTER — Encounter: Payer: Self-pay | Admitting: Family Medicine

## 2011-10-13 VITALS — BP 130/88 | HR 64 | Temp 97.4°F | Ht 64.5 in | Wt 195.2 lb

## 2011-10-13 DIAGNOSIS — M791 Myalgia, unspecified site: Secondary | ICD-10-CM

## 2011-10-13 DIAGNOSIS — E7800 Pure hypercholesterolemia, unspecified: Secondary | ICD-10-CM

## 2011-10-13 DIAGNOSIS — I10 Essential (primary) hypertension: Secondary | ICD-10-CM

## 2011-10-13 DIAGNOSIS — E538 Deficiency of other specified B group vitamins: Secondary | ICD-10-CM

## 2011-10-13 DIAGNOSIS — IMO0001 Reserved for inherently not codable concepts without codable children: Secondary | ICD-10-CM

## 2011-10-13 DIAGNOSIS — E669 Obesity, unspecified: Secondary | ICD-10-CM

## 2011-10-13 MED ORDER — CYANOCOBALAMIN 1000 MCG/ML IJ SOLN
1000.0000 ug | Freq: Once | INTRAMUSCULAR | Status: AC
  Administered 2011-10-13: 1000 ug via INTRAMUSCULAR

## 2011-10-13 NOTE — Assessment & Plan Note (Signed)
Overall better after tx of troch bursitis Also needs meniscal sx L knee Outlook is good and exercising Rev last labs  Off statin

## 2011-10-13 NOTE — Assessment & Plan Note (Signed)
bp in fair control at this time  No changes needed  Disc lifstyle change with low sodium diet and exercise   

## 2011-10-13 NOTE — Assessment & Plan Note (Signed)
Pt intol of statins Will work harder on diet Rev last labs Rev low sat fat diet  Lab and f/u 6 mo

## 2011-10-13 NOTE — Progress Notes (Signed)
Subjective:    Patient ID: Jennifer Bartlett, female    DOB: November 14, 1945, 66 y.o.   MRN: [id number]  HPI Here for f/u of chronic issues Overall pretty good  Outdoors with grandson a lot   Also enrolled in water aerobics   bp is 140/74     Today No cp or palpitations or headaches or edema  No side effects to medicines   Wt stable  bmi is 33 presently    Chemistry      Component Value Date/Time   NA 140 12/16/2010 0943   K 4.0 12/16/2010 0943   CL 104 12/16/2010 0943   CO2 31 12/16/2010 0943   BUN 19 12/16/2010 0943   CREATININE 0.7 12/16/2010 0943      Component Value Date/Time   CALCIUM 8.7 12/16/2010 0943   ALKPHOS 56 12/16/2010 0943   AST 26 05/12/2011 0904   ALT 27 05/12/2011 0904   BILITOT 0.5 12/16/2010 0943       Myalgias- last visit did labs ESR up a bit ? No diff off statin  Saw Dr Patsy Lager- given shots for trochanteric bursitis -helped and lasted for 21/2 weeks  Overall the really bad pain is better  On ibuprofen or aleve-helps  Told to continue to exercise to eventually get it better  Also has a bit of low back pain- in evenings  Shoulders and arms are ok   Lipids high -unable to tol statins Lab Results  Component Value Date   CHOL 154 05/12/2011   CHOL 212* 03/31/2011   CHOL 185 12/16/2010   Lab Results  Component Value Date   HDL 60.40 05/12/2011   HDL 53.90 03/31/2011   HDL 78.29 12/16/2010   Lab Results  Component Value Date   LDLCALC 79 05/12/2011   LDLCALC 118* 12/16/2010   LDLCALC 93 12/07/2009   Lab Results  Component Value Date   TRIG 75.0 05/12/2011   TRIG 88.0 03/31/2011   TRIG 67.0 12/16/2010   Lab Results  Component Value Date   CHOLHDL 3 05/12/2011   CHOLHDL 4 03/31/2011   CHOLHDL 3 12/16/2010   Lab Results  Component Value Date   LDLDIRECT 151.5 03/31/2011    Patient Active Problem List  Diagnoses  . HERPES ZOSTER  . HYPOGLYCEMIA NOS  . VITAMIN B12 DEFICIENCY  . UNSPECIFIED VITAMIN D DEFICIENCY  . HYPERCHOLESTEROLEMIA  .  OBESITY  . ANEMIA, IRON DEFICIENCY  . STRESS REACTION, ACUTE, WITH EMOTIONAL DISTURBANCE  . HYPERTENSION  . GERD  . Left shoulder pain  . Myalgia  . Joint pain  . Trochanteric bursitis   Past Medical History  Diagnosis Date  . GERD (gastroesophageal reflux disease)   . Hyperlipidemia   . Obesity   . Vitamin B 12 deficiency   . Hypoglycemia   . Hypertension   . Anemia    Past Surgical History  Procedure Date  . Partial hysterectomy 1990's    bleeding  . Knee surgery     left  . 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 Hernia repair   . Abdominal hysterectomy    History  Substance Use Topics  . Smoking status: Never Smoker   . Smokeless tobacco: Never Used  . Alcohol Use: No   Family History  Problem Relation Age of Onset  . Heart disease Mother     MI  . Hypertension Mother   . Alcohol abuse Father   . Heart disease Father     smoker  . Hypertension Father   .  Breast cancer Paternal Grandmother   . Diabetes Maternal Aunt    Allergies  Allergen Reactions  . Crestor (Rosuvastatin Calcium)     Myalgia   . Lipitor (Atorvastatin Calcium)     Aches and pains  . Penicillins     REACTION: swelling  . Zocor (Simvastatin - High Dose) Other (See Comments)    Muscle/joint aches   Current Outpatient Prescriptions on File Prior to Visit  Medication Sig Dispense Refill  . CALCIUM PO Take by mouth daily.        . cholecalciferol (VITAMIN D) 400 UNITS TABS Take by mouth. 2 qd        . cyanocobalamin (,VITAMIN B-12,) 1000 MCG/ML injection Inject 1,000 mcg into the muscle once. Every 2 months       . ergocalciferol (VITAMIN D2) 50000 UNITS capsule Take 50,000 Units by mouth once a week. Times 10 weeks.       Marland Kitchen estropipate (OGEN) 1.5 MG tablet Take 0.5 tablets (0.75 mg total) by mouth daily.  30 tablet  12  . hydrochlorothiazide (HYDRODIURIL) 25 MG tablet TAKE ONE TABLET BY MOUTH EVERY DAY  90 tablet  2  . Multiple Vitamin (MULTIVITAMIN) capsule Take 1 capsule by mouth daily.        .  Omega-3 Fatty Acids (FISH OIL PO) Take by mouth daily.        Marland Kitchen omeprazole (PRILOSEC) 20 MG capsule Take one by mouth daily  90 capsule  3  . vitamin E 800 UNIT capsule Take 800 Units by mouth daily.        Marland Kitchen zinc gluconate 50 MG tablet Take 50 mg by mouth daily.           Review of Systems Review of Systems  Constitutional: Negative for fever, appetite change, fatigue and unexpected weight change.  Eyes: Negative for pain and visual disturbance.  Respiratory: Negative for cough and shortness of breath.   Cardiovascular: Negative for cp or palpitations    Gastrointestinal: Negative for nausea, diarrhea and constipation.  Genitourinary: Negative for urgency and frequency.  Skin: Negative for pallor or rash   MSK neg for muscle pain , pos for hip pain from bursitis that is improved Neurological: Negative for weakness, light-headedness, numbness and headaches.  Hematological: Negative for adenopathy. Does not bruise/bleed easily.  Psychiatric/Behavioral: Negative for dysphoric mood. The patient is not nervous/anxious.          Objective:   Physical Exam  Constitutional: She appears well-developed and well-nourished. No distress.       overwt and well appearing   HENT:  Head: Normocephalic and atraumatic.  Mouth/Throat: Oropharynx is clear and moist.  Eyes: Conjunctivae and EOM are normal. Pupils are equal, round, and reactive to light. No scleral icterus.  Neck: Normal range of motion. Neck supple. No JVD present. No thyromegaly present.  Cardiovascular: Normal rate, regular rhythm, normal heart sounds and intact distal pulses.  Exam reveals no gallop.   Pulmonary/Chest: Effort normal and breath sounds normal. No respiratory distress. She has no wheezes.  Abdominal: Soft. Bowel sounds are normal. She exhibits no distension and no mass. There is no tenderness.  Musculoskeletal: She exhibits tenderness. She exhibits no edema.       Some trochanteric tenderness No muscular tenderness     Lymphadenopathy:    She has no cervical adenopathy.  Neurological: She is alert. She has normal reflexes.  Skin: Skin is warm and dry. No rash noted. No erythema. No pallor.  Psychiatric: She has  a normal mood and affect.          Assessment & Plan:

## 2011-10-13 NOTE — Patient Instructions (Signed)
I'm glad you are doing better with pain Keep exercising  bp is good  Schedule annual exam in 6 months with labs prior

## 2011-10-15 NOTE — Assessment & Plan Note (Signed)
Injection on schedule today

## 2011-10-17 ENCOUNTER — Other Ambulatory Visit: Payer: Self-pay | Admitting: Family Medicine

## 2012-05-01 ENCOUNTER — Other Ambulatory Visit: Payer: Self-pay | Admitting: Family Medicine

## 2012-05-01 NOTE — Telephone Encounter (Signed)
Please call her to schedule any 30 min visit for annual exam in fall or winter and refil med until then, thanks

## 2012-05-01 NOTE — Telephone Encounter (Signed)
Left voicemail requesting pt to call back, i will try to call pt back

## 2012-05-01 NOTE — Telephone Encounter (Signed)
OK to refill? Last OV 10/13/11 an @ that visit pt instructions was to follow up in 6 months for CPE with labs, no appt scheduled and no recent labs

## 2012-05-02 NOTE — Telephone Encounter (Signed)
Left voicemail requesting pt to call back, will try to call pt back later

## 2012-05-07 ENCOUNTER — Encounter: Payer: Self-pay | Admitting: *Deleted

## 2012-05-07 NOTE — Telephone Encounter (Signed)
Tried to call pt with no luck so mailed letter requesting pt to call the office to schedule an appt so we can refill her med

## 2012-05-08 ENCOUNTER — Telehealth: Payer: Self-pay

## 2012-05-08 ENCOUNTER — Ambulatory Visit: Payer: 59

## 2012-05-08 NOTE — Telephone Encounter (Signed)
Pt wanted to schedule Vit B 12 had not had since 09/2011. Advised pt Dr Milinda Antis wanted pt to return in 09/13 for annual exam with labs prior. Pt said she would schedule cpx and get b12 then. Forest Becker scheduling appt.

## 2012-07-03 ENCOUNTER — Other Ambulatory Visit: Payer: Self-pay | Admitting: Family Medicine

## 2012-08-04 ENCOUNTER — Other Ambulatory Visit: Payer: Self-pay | Admitting: Family Medicine

## 2012-08-04 ENCOUNTER — Other Ambulatory Visit: Payer: Self-pay | Admitting: Women's Health

## 2012-08-28 ENCOUNTER — Telehealth: Payer: Self-pay | Admitting: Family Medicine

## 2012-08-28 DIAGNOSIS — K219 Gastro-esophageal reflux disease without esophagitis: Secondary | ICD-10-CM

## 2012-08-28 DIAGNOSIS — E559 Vitamin D deficiency, unspecified: Secondary | ICD-10-CM

## 2012-08-28 DIAGNOSIS — E7800 Pure hypercholesterolemia, unspecified: Secondary | ICD-10-CM

## 2012-08-28 DIAGNOSIS — I10 Essential (primary) hypertension: Secondary | ICD-10-CM

## 2012-08-28 DIAGNOSIS — E538 Deficiency of other specified B group vitamins: Secondary | ICD-10-CM

## 2012-08-28 DIAGNOSIS — D509 Iron deficiency anemia, unspecified: Secondary | ICD-10-CM

## 2012-08-28 NOTE — Telephone Encounter (Signed)
Message copied by Judy Pimple on Wed Aug 28, 2012 10:22 AM ------      Message from: Alvina Chou      Created: Wed Aug 21, 2012  3:37 PM      Regarding: lab orders for Thursday, 1.30.14       Patient is scheduled for CPX labs, please order future labs, Thanks , Camelia Eng

## 2012-08-29 ENCOUNTER — Other Ambulatory Visit (INDEPENDENT_AMBULATORY_CARE_PROVIDER_SITE_OTHER): Payer: 59

## 2012-08-29 DIAGNOSIS — E559 Vitamin D deficiency, unspecified: Secondary | ICD-10-CM

## 2012-08-29 DIAGNOSIS — E538 Deficiency of other specified B group vitamins: Secondary | ICD-10-CM

## 2012-08-29 DIAGNOSIS — I10 Essential (primary) hypertension: Secondary | ICD-10-CM

## 2012-08-29 DIAGNOSIS — K219 Gastro-esophageal reflux disease without esophagitis: Secondary | ICD-10-CM

## 2012-08-29 DIAGNOSIS — D509 Iron deficiency anemia, unspecified: Secondary | ICD-10-CM

## 2012-08-29 DIAGNOSIS — E7800 Pure hypercholesterolemia, unspecified: Secondary | ICD-10-CM

## 2012-08-29 LAB — CBC WITH DIFFERENTIAL/PLATELET
Basophils Absolute: 0.1 10*3/uL (ref 0.0–0.1)
Basophils Relative: 0.8 % (ref 0.0–3.0)
Eosinophils Absolute: 0.3 10*3/uL (ref 0.0–0.7)
Lymphocytes Relative: 31.2 % (ref 12.0–46.0)
MCHC: 33.3 g/dL (ref 30.0–36.0)
MCV: 88.6 fl (ref 78.0–100.0)
Monocytes Absolute: 0.7 10*3/uL (ref 0.1–1.0)
Neutrophils Relative %: 57.5 % (ref 43.0–77.0)
Platelets: 249 10*3/uL (ref 150.0–400.0)
RBC: 4.2 Mil/uL (ref 3.87–5.11)
RDW: 15.4 % — ABNORMAL HIGH (ref 11.5–14.6)

## 2012-08-29 LAB — COMPREHENSIVE METABOLIC PANEL
ALT: 15 U/L (ref 0–35)
AST: 19 U/L (ref 0–37)
Albumin: 3.7 g/dL (ref 3.5–5.2)
Alkaline Phosphatase: 62 U/L (ref 39–117)
BUN: 22 mg/dL (ref 6–23)
Chloride: 99 mEq/L (ref 96–112)
Creatinine, Ser: 0.9 mg/dL (ref 0.4–1.2)
Potassium: 4 mEq/L (ref 3.5–5.1)

## 2012-08-29 LAB — LIPID PANEL
HDL: 59.1 mg/dL (ref >39.00)
LDL Cholesterol: 107 mg/dL — ABNORMAL HIGH (ref 0–99)
Total CHOL/HDL Ratio: 3
Triglycerides: 170 mg/dL — ABNORMAL HIGH (ref 0.0–149.0)

## 2012-08-29 LAB — TSH: TSH: 2.92 u[IU]/mL (ref 0.35–5.50)

## 2012-09-04 ENCOUNTER — Other Ambulatory Visit: Payer: Self-pay | Admitting: Women's Health

## 2012-09-04 ENCOUNTER — Other Ambulatory Visit: Payer: Self-pay | Admitting: Family Medicine

## 2012-09-06 ENCOUNTER — Ambulatory Visit (INDEPENDENT_AMBULATORY_CARE_PROVIDER_SITE_OTHER): Payer: 59 | Admitting: Family Medicine

## 2012-09-06 ENCOUNTER — Encounter: Payer: Self-pay | Admitting: Family Medicine

## 2012-09-06 VITALS — BP 140/80 | HR 68 | Temp 97.9°F | Ht 63.5 in | Wt 199.2 lb

## 2012-09-06 DIAGNOSIS — E7800 Pure hypercholesterolemia, unspecified: Secondary | ICD-10-CM

## 2012-09-06 DIAGNOSIS — E559 Vitamin D deficiency, unspecified: Secondary | ICD-10-CM

## 2012-09-06 DIAGNOSIS — I10 Essential (primary) hypertension: Secondary | ICD-10-CM

## 2012-09-06 DIAGNOSIS — Z23 Encounter for immunization: Secondary | ICD-10-CM

## 2012-09-06 DIAGNOSIS — Z1211 Encounter for screening for malignant neoplasm of colon: Secondary | ICD-10-CM | POA: Insufficient documentation

## 2012-09-06 DIAGNOSIS — E669 Obesity, unspecified: Secondary | ICD-10-CM

## 2012-09-06 DIAGNOSIS — E538 Deficiency of other specified B group vitamins: Secondary | ICD-10-CM

## 2012-09-06 MED ORDER — CYANOCOBALAMIN 1000 MCG/ML IJ SOLN
1000.0000 ug | Freq: Once | INTRAMUSCULAR | Status: AC
  Administered 2012-09-06: 1000 ug via INTRAMUSCULAR

## 2012-09-06 NOTE — Progress Notes (Signed)
Subjective:    Patient ID: Jennifer Bartlett, female    DOB: 1946/06/07, 67 y.o.   MRN: [id number]  HPI Here for check up of chronic medical conditions and to review health mt list   Has been feeling pretty good overall  Very busy  Not as much exercise - taking care of grandkids- excited for spring  Trying to take good care of herself   Wt is up 4 lb with bmi of 34 Hx of gastric bypass surg Doing very well with her diet choices lately   B12 is 297  Shots- forgot her last shots -has been over 3 months   D level is 30- taking 1000 iu daily  Cannot do dairy products except small amounts of cheese  Does not like beef anymore   Glucose 115- is trying to stay away from sugars - she is very very careful  No sweets or sweet drinks   On HRT Hysterectomy in past  Sees gyn yearly - her appt is in march Is going to start spacing out pap smears   mammo has scheduled for next week  Self exam -no lumps   Colon screen - cannot have colonoscopies  Will do ifob   Flu vaccine- got it in oct  Pneumovax - has not had one yet   Falls-none   Mood- overall very good   Lipids Lab Results  Component Value Date   CHOL 200 08/29/2012   HDL 59.10 08/29/2012   LDLCALC 107* 08/29/2012   LDLDIRECT 151.5 03/31/2011   TRIG 170.0* 08/29/2012   CHOLHDL 3 08/29/2012   overall ratio is really good - no pain off of crestor now  bp is stable today - on re check 140/80-- at home is 130s/80s No cp or palpitations or headaches or edema  No side effects to medicines  BP Readings from Last 3 Encounters:  09/06/12 152/82  10/13/11 130/88  10/11/11 130/80     Patient Active Problem List  Diagnosis  . HERPES ZOSTER  . HYPOGLYCEMIA NOS  . VITAMIN B12 DEFICIENCY  . UNSPECIFIED VITAMIN D DEFICIENCY  . HYPERCHOLESTEROLEMIA  . OBESITY  . ANEMIA, IRON DEFICIENCY  . STRESS REACTION, ACUTE, WITH EMOTIONAL DISTURBANCE  . HYPERTENSION  . GERD  . Myalgia  . Joint pain  . Trochanteric bursitis  . Colon  cancer screening   Past Medical History  Diagnosis Date  . GERD (gastroesophageal reflux disease)   . Hyperlipidemia   . Obesity   . Vitamin B 12 deficiency   . Hypoglycemia   . Hypertension   . Anemia    Past Surgical History  Procedure Laterality Date  . Partial hysterectomy  1990's    bleeding  . Knee surgery      left  . Gastric bypass  08/2003  . Hernia repair    . Abdominal hysterectomy     History  Substance Use Topics  . Smoking status: Never Smoker   . Smokeless tobacco: Never Used  . Alcohol Use: No   Family History  Problem Relation Age of Onset  . Heart disease Mother     MI  . Hypertension Mother   . Alcohol abuse Father   . Heart disease Father     smoker  . Hypertension Father   . Breast cancer Paternal Grandmother   . Diabetes Maternal Aunt    Allergies  Allergen Reactions  . Crestor (Rosuvastatin Calcium)     Myalgia   . Lipitor (Atorvastatin Calcium)  Aches and pains  . Penicillins     REACTION: swelling  . Zocor (Simvastatin - High Dose) Other (See Comments)    Muscle/joint aches   Current Outpatient Prescriptions on File Prior to Visit  Medication Sig Dispense Refill  . CALCIUM PO Take by mouth daily.        . cholecalciferol (VITAMIN D) 400 UNITS TABS Take by mouth. 2 qd        . cyanocobalamin (,VITAMIN B-12,) 1000 MCG/ML injection Inject 1,000 mcg into the muscle once. Every 2 months       . ergocalciferol (VITAMIN D2) 50000 UNITS capsule Take 50,000 Units by mouth once a week. Times 10 weeks.       Marland Kitchen estropipate (OGEN) 1.5 MG tablet Take 0.5 tablets (0.75 mg total) by mouth daily.  30 tablet  12  . estropipate (OGEN) 1.5 MG tablet TAKE ONE TABLET BY MOUTH EVERY DAY  30 tablet  0  . hydrochlorothiazide (HYDRODIURIL) 25 MG tablet TAKE ONE TABLET BY MOUTH EVERY DAY  90 tablet  0  . Multiple Vitamin (MULTIVITAMIN) capsule Take 1 capsule by mouth daily.        . Omega-3 Fatty Acids (FISH OIL PO) Take by mouth daily.        Marland Kitchen  omeprazole (PRILOSEC) 20 MG capsule TAKE ONE CAPSULE BY MOUTH EVERY DAY  90 capsule  0  . vitamin E 800 UNIT capsule Take 800 Units by mouth daily.        Marland Kitchen zinc gluconate 50 MG tablet Take 50 mg by mouth daily.         No current facility-administered medications on file prior to visit.    Review of Systems Review of Systems  Constitutional: Negative for fever, appetite change, fatigue and unexpected weight change.  Eyes: Negative for pain and visual disturbance.  Respiratory: Negative for cough and shortness of breath.   Cardiovascular: Negative for cp or palpitations    Gastrointestinal: Negative for nausea, diarrhea and constipation.  Genitourinary: Negative for urgency and frequency.  Skin: Negative for pallor or rash   Neurological: Negative for weakness, light-headedness, numbness and headaches.  Hematological: Negative for adenopathy. Does not bruise/bleed easily.  Psychiatric/Behavioral: Negative for dysphoric mood. The patient is not nervous/anxious.         Objective:   Physical Exam  Constitutional: She appears well-developed and well-nourished. No distress.  obese and well appearing   HENT:  Head: Normocephalic and atraumatic.  Right Ear: External ear normal.  Left Ear: External ear normal.  Nose: Nose normal.  Mouth/Throat: Oropharynx is clear and moist. No oropharyngeal exudate.  Eyes: Conjunctivae and EOM are normal. Pupils are equal, round, and reactive to light. Right eye exhibits no discharge. Left eye exhibits no discharge. No scleral icterus.  Neck: Normal range of motion. Neck supple. No JVD present. Carotid bruit is not present. No thyromegaly present.  Cardiovascular: Normal rate, regular rhythm, normal heart sounds and intact distal pulses.  Exam reveals no gallop.   Pulmonary/Chest: Effort normal and breath sounds normal. No respiratory distress. She has no wheezes. She exhibits no tenderness.  Abdominal: Soft. Bowel sounds are normal. She exhibits no  distension, no abdominal bruit and no mass. There is no tenderness.  Musculoskeletal: Normal range of motion. She exhibits no edema and no tenderness.  Lymphadenopathy:    She has no cervical adenopathy.  Neurological: She is alert. She has normal reflexes. No cranial nerve deficit. She exhibits normal muscle tone. Coordination normal.  Skin: Skin  is warm and dry. No rash noted. No erythema. No pallor.  Psychiatric: She has a normal mood and affect.          Assessment & Plan:

## 2012-09-06 NOTE — Patient Instructions (Addendum)
B12 shot today Pneumonia vaccine today  Increase your vitamin D to 2000 iu daily  Please do IFOB card for colon cancer screening  Follow up with me in 6 months  Keep working on healthy diet and exercise

## 2012-09-08 NOTE — Assessment & Plan Note (Signed)
Level in low nl range for pt with hx of gastric surg Not compliant with shots Shot today Then plan f/u  Enc her to come on time for these

## 2012-09-08 NOTE — Assessment & Plan Note (Signed)
Disc goals for lipids and reasons to control them Rev labs with pt Rev low sat fat diet in detail   

## 2012-09-08 NOTE — Assessment & Plan Note (Signed)
D level is low nl in pt with hx of gastric surg  Will inc dose to 2000 iu daily  Continue to follow

## 2012-09-08 NOTE — Assessment & Plan Note (Signed)
bp in fair control at this time  No changes needed  Disc lifstyle change with low sodium diet and exercise  Labs reviewed  

## 2012-09-08 NOTE — Assessment & Plan Note (Signed)
Pt is gaining some wt back after bariatric surg emph imp of staying on program  Disc exercise- 5 d per week goal as well

## 2012-09-08 NOTE — Assessment & Plan Note (Signed)
Per pt- she is not a candidate for colonoscopy due to her bariatric surgery Given IFOB card

## 2012-09-09 ENCOUNTER — Telehealth: Payer: Self-pay

## 2012-09-09 MED ORDER — HYDROCHLOROTHIAZIDE 25 MG PO TABS: ORAL_TABLET | ORAL | Status: DC

## 2012-09-09 MED ORDER — OMEPRAZOLE 20 MG PO CPDR: DELAYED_RELEASE_CAPSULE | ORAL | Status: DC

## 2012-09-09 NOTE — Telephone Encounter (Signed)
Pt called back and said not sure of name of meds needed but one is a fluid pill and the other is heartburn pill; i asked pt if HCTZ and omprazole was name of meds; pt said yes. Notified refilled to [facility] [address].

## 2012-09-09 NOTE — Telephone Encounter (Signed)
Pt left v/m requesting med refilled; left v/m for pt to call back need name of med to be refilled and what pharmacy.

## 2012-09-10 ENCOUNTER — Telehealth: Payer: Self-pay | Admitting: *Deleted

## 2012-09-10 MED ORDER — ESTROPIPATE 1.5 MG PO TABS: ORAL_TABLET | ORAL | Status: DC

## 2012-09-10 NOTE — Telephone Encounter (Signed)
Pt called requesting refill on ogen 1.5 mg, pt has annual on feb 21st. She rescheduled her 09/13/12 appt due to possible snow. 30 days supply sent with no refills.

## 2012-09-13 ENCOUNTER — Encounter: Payer: 59 | Admitting: Women's Health

## 2012-09-20 ENCOUNTER — Ambulatory Visit (INDEPENDENT_AMBULATORY_CARE_PROVIDER_SITE_OTHER): Payer: 59 | Admitting: Women's Health

## 2012-09-20 ENCOUNTER — Encounter: Payer: Self-pay | Admitting: Women's Health

## 2012-09-20 VITALS — BP 130/90 | Ht 64.0 in | Wt 199.0 lb

## 2012-09-20 DIAGNOSIS — Z780 Asymptomatic menopausal state: Secondary | ICD-10-CM

## 2012-09-20 DIAGNOSIS — Z01419 Encounter for gynecological examination (general) (routine) without abnormal findings: Secondary | ICD-10-CM

## 2012-09-20 MED ORDER — ESTROPIPATE 1.5 MG PO TABS: 0.7500 mg | ORAL_TABLET | Freq: Every day | ORAL | Status: DC

## 2012-09-20 NOTE — Patient Instructions (Addendum)

## 2012-09-20 NOTE — Progress Notes (Signed)
Jennifer Bartlett 1946-03-27 [id number]    History:    The patient presents for annual exam.  TAH for DUB on estradiol 1.5 daily with occasional hot flushes. Primary care manages manages hypertension/hyperlipidemia. Normal DEXA per primary care. Receive Pneumovax and zostavac. History of gastric bypass, no colonoscopy.   Past medical history, past surgical history, family history and social history were all reviewed and documented in the EPIC chart. Retired. Jennifer Bartlett and Jennifer Bartlett are both doing well. Adopted daughter Jennifer Bartlett struggling, has 75-year-old Jennifer Bartlett both now live full-time with patient. (Derrick's father in jail)   ROS:  A  ROS was performed and pertinent positives and negatives are included in the history.  Exam:  Filed Vitals:   09/20/12 0901  BP: 130/90    General appearance:  Normal Head/Neck:  Normal, without cervical or supraclavicular adenopathy. Thyroid:  Symmetrical, normal in size, without palpable masses or nodularity. Respiratory  Effort:  Normal  Auscultation:  Clear without wheezing or rhonchi Cardiovascular  Auscultation:  Regular rate, without rubs, murmurs or gallops  Edema/varicosities:  Not grossly evident Abdominal  Soft,nontender, without masses, guarding or rebound.  Liver/spleen:  No organomegaly noted  Hernia:  None appreciated  Skin  Inspection:  Grossly normal  Palpation:  Grossly normal Neurologic/psychiatric  Orientation:  Normal with appropriate conversation.  Mood/affect:  Normal  Genitourinary    Breasts: Examined lying and sitting.     Right: Without masses, retractions, discharge or axillary adenopathy.     Left: Without masses, retractions, discharge or axillary adenopathy.   Inguinal/mons:  Normal without inguinal adenopathy  External genitalia:  Normal  BUS/Urethra/Skene's glands:  Normal  Bladder:  Normal  Vagina:  Normal  Cervix:  absent  Uterus:  Absent  Adnexa/parametria:     Rt: Without masses or  tenderness.   Lt: Without masses or tenderness.  Anus and perineum: Normal  Digital rectal exam: Normal sphincter tone without palpated masses or tenderness  Assessment/Plan:  66 y.o.MWF G2P2  for annual exam with no complaints.    TAH/ estradiol 1.5 mg Gastric bypass history- 8 pound weight gain in the past year Hypertension/hyperlipidemia-primary care labs and meds  Plan: SBE's, continue annual mammogram, calcium rich diet, vitamin D 2000 daily encouraged.Tdap vaccine reviewed will check with primary care when last received. Encouraged to increase regular exercise and decrease calories for weight loss and maintenance. Home Hemoccult card given. History of normal Paps, new screening guidelines reviewed. Estradiol 1.5 mg prescription,  risk for blood clots, strokes, breast cancer reviewed. States continues with some hot flushes and many when tries to stop. Vitamin D - 30 at primary care instructed to take 2000 IUs daily.    Harrington Challenger [facility], 9:52 AM 09/20/2012

## 2012-10-04 ENCOUNTER — Ambulatory Visit: Payer: 59

## 2013-01-04 ENCOUNTER — Encounter: Payer: Self-pay | Admitting: Family Medicine

## 2013-01-04 ENCOUNTER — Ambulatory Visit (INDEPENDENT_AMBULATORY_CARE_PROVIDER_SITE_OTHER): Payer: 59 | Admitting: Family Medicine

## 2013-01-04 ENCOUNTER — Encounter (HOSPITAL_COMMUNITY): Payer: Self-pay | Admitting: Emergency Medicine

## 2013-01-04 ENCOUNTER — Emergency Department (HOSPITAL_COMMUNITY)
Admission: EM | Admit: 2013-01-04 | Discharge: 2013-01-04 | Disposition: A | Discharge status: Home / Self Care | Payer: 59 | Attending: Emergency Medicine | Admitting: Emergency Medicine

## 2013-01-04 VITALS — BP 128/80 | HR 89 | Temp 99.1°F | Wt 197.0 lb

## 2013-01-04 DIAGNOSIS — IMO0001 Reserved for inherently not codable concepts without codable children: Secondary | ICD-10-CM | POA: Insufficient documentation

## 2013-01-04 DIAGNOSIS — R070 Pain in throat: Secondary | ICD-10-CM | POA: Diagnosis not present

## 2013-01-04 DIAGNOSIS — E538 Deficiency of other specified B group vitamins: Secondary | ICD-10-CM | POA: Insufficient documentation

## 2013-01-04 DIAGNOSIS — I10 Essential (primary) hypertension: Secondary | ICD-10-CM | POA: Diagnosis not present

## 2013-01-04 DIAGNOSIS — R6883 Chills (without fever): Secondary | ICD-10-CM | POA: Diagnosis not present

## 2013-01-04 DIAGNOSIS — E785 Hyperlipidemia, unspecified: Secondary | ICD-10-CM | POA: Diagnosis not present

## 2013-01-04 DIAGNOSIS — R1310 Dysphagia, unspecified: Secondary | ICD-10-CM | POA: Insufficient documentation

## 2013-01-04 DIAGNOSIS — Z79899 Other long term (current) drug therapy: Secondary | ICD-10-CM | POA: Diagnosis not present

## 2013-01-04 DIAGNOSIS — D649 Anemia, unspecified: Secondary | ICD-10-CM | POA: Diagnosis not present

## 2013-01-04 DIAGNOSIS — K219 Gastro-esophageal reflux disease without esophagitis: Secondary | ICD-10-CM | POA: Insufficient documentation

## 2013-01-04 DIAGNOSIS — J36 Peritonsillar abscess: Secondary | ICD-10-CM | POA: Diagnosis not present

## 2013-01-04 DIAGNOSIS — Z862 Personal history of diseases of the blood and blood-forming organs and certain disorders involving the immune mechanism: Secondary | ICD-10-CM | POA: Diagnosis not present

## 2013-01-04 DIAGNOSIS — Z8639 Personal history of other endocrine, nutritional and metabolic disease: Secondary | ICD-10-CM | POA: Insufficient documentation

## 2013-01-04 DIAGNOSIS — E669 Obesity, unspecified: Secondary | ICD-10-CM | POA: Insufficient documentation

## 2013-01-04 MED ORDER — CLINDAMYCIN HCL 300 MG PO CAPS: 300.0000 mg | ORAL_CAPSULE | Freq: Three times a day (TID) | ORAL | Status: DC

## 2013-01-04 MED ORDER — HYDROCODONE-ACETAMINOPHEN 10-325 MG/15ML PO SOLN: 15.0000 mL | ORAL | Status: DC | PRN

## 2013-01-04 MED ORDER — PROMETHAZINE HCL 25 MG RE SUPP: 25.0000 mg | Freq: Four times a day (QID) | RECTAL | Status: DC | PRN

## 2013-01-04 NOTE — Progress Notes (Signed)
Subjective:    Patient ID: Jennifer Bartlett, female    DOB: 11-06-1945, 67 y.o.   MRN: [id number]  HPI Here with ST Started on Monday -thought she may have a cold Past 2 days- throat is swollen -more on the R side , and it hurts to swallow  Drinking lemon/ honey / tea  Also icee- numbs it a little bit   Now when she tries to swallow she cannot get anything down- liquids come back out of her nose   Hard to swallow at times  Low grade fever  No strep contacts   Patient Active Problem List   Diagnosis Date Noted  . Colon cancer screening 09/06/2012  . UNSPECIFIED VITAMIN D DEFICIENCY 12/28/2009  . ANEMIA, IRON DEFICIENCY 12/28/2009  . STRESS REACTION, ACUTE, WITH EMOTIONAL DISTURBANCE 12/07/2009  . HYPOGLYCEMIA NOS 05/28/2007  . VITAMIN B12 DEFICIENCY 03/07/2007  . HYPERCHOLESTEROLEMIA 03/07/2007  . OBESITY 03/07/2007  . HYPERTENSION 03/07/2007  . GERD 03/07/2007   Past Medical History  Diagnosis Date  . GERD (gastroesophageal reflux disease)   . Hyperlipidemia   . Obesity   . Vitamin B 12 deficiency   . Hypoglycemia   . Hypertension   . Anemia    Past Surgical History  Procedure Laterality Date  . Partial hysterectomy  1990's    bleeding  . Knee surgery      left  . Gastric bypass  08/2003  . Hernia repair    . Abdominal hysterectomy     History  Substance Use Topics  . Smoking status: Never Smoker   . Smokeless tobacco: Never Used  . Alcohol Use: No   Family History  Problem Relation Age of Onset  . Heart disease Mother     MI  . Hypertension Mother   . Alcohol abuse Father   . Heart disease Father     smoker  . Hypertension Father   . Breast cancer Paternal Grandmother   . Diabetes Maternal Aunt    Allergies  Allergen Reactions  . Crestor (Rosuvastatin Calcium)     Myalgia   . Lipitor (Atorvastatin Calcium)     Aches and pains  . Penicillins     REACTION: swelling  . Zocor (Simvastatin - High Dose) Other (See Comments)    Muscle/joint aches    Current Outpatient Prescriptions on File Prior to Visit  Medication Sig Dispense Refill  . BIOTIN PO Take 1 tablet by mouth daily.      Marland Kitchen CALCIUM PO Take by mouth daily.        . cholecalciferol (VITAMIN D) 400 UNITS TABS Take 400 Units by mouth daily.       . cyanocobalamin (,VITAMIN B-12,) 1000 MCG/ML injection Inject 1,000 mcg into the muscle once. Every 2 months       . ergocalciferol (VITAMIN D2) 50000 UNITS capsule Take 50,000 Units by mouth once a week. Times 10 weeks.       Marland Kitchen estropipate (OGEN) 1.5 MG tablet Take 0.5 tablets (0.75 mg total) by mouth daily.  30 tablet  12  . hydrochlorothiazide (HYDRODIURIL) 25 MG tablet TAKE ONE TABLET BY MOUTH EVERY DAY  90 tablet  1  . Multiple Vitamin (MULTIVITAMIN) capsule Take 1 capsule by mouth daily.        . Omega-3 Fatty Acids (FISH OIL PO) Take by mouth daily.        Marland Kitchen omeprazole (PRILOSEC) 20 MG capsule TAKE ONE CAPSULE BY MOUTH EVERY DAY  90 capsule  1  .  vitamin E 800 UNIT capsule Take 800 Units by mouth daily.        Marland Kitchen zinc gluconate 50 MG tablet Take 50 mg by mouth daily.         No current facility-administered medications on file prior to visit.      Review of Systems Review of Systems  Constitutional: Negative for appetite change, and unexpected weight change.  Eyes: Negative for pain and visual disturbance.  ENT pos for very sore throat and inability to swallow , some nasal drainage Respiratory: Negative for cough and shortness of breath.   Cardiovascular: Negative for cp or palpitations    Gastrointestinal: Negative for nausea, diarrhea and constipation.  Genitourinary: Negative for urgency and frequency.  Skin: Negative for pallor or rash   Neurological: Negative for weakness, light-headedness, numbness and headaches.  Hematological: Negative for adenopathy. Does not bruise/bleed easily.  Psychiatric/Behavioral: Negative for dysphoric mood. The patient is not nervous/anxious.         Objective:   Physical Exam   Constitutional: She appears well-developed and well-nourished.  HENT:  Head: Normocephalic and atraumatic.  Nose: Nose normal.  Mouth/Throat: No oropharyngeal exudate.  Diffuse throat erythema with R sided swelling past the midline No exudate Pt has problem swallowing  Eyes: Conjunctivae and EOM are normal. Pupils are equal, round, and reactive to light. Right eye exhibits no discharge. Left eye exhibits no discharge.  Neck: Normal range of motion. Neck supple.  Cardiovascular: Normal rate and regular rhythm.   Pulmonary/Chest: Effort normal and breath sounds normal.  Skin: Skin is warm and dry. No rash noted.  Psychiatric: She has a normal mood and affect.          Assessment & Plan:

## 2013-01-04 NOTE — ED Provider Notes (Signed)
History     CSN: [id number]  Arrival date & time 01/04/13  1254   First MD Initiated Contact with Patient 01/04/13 1358      Chief Complaint  Patient presents with  . Sore Throat    (Consider location/radiation/quality/duration/timing/severity/associated sxs/prior treatment) HPI Comments: Pt presents to the ED for sore throat x 5 days, worse on the right side.  Also notes intermittent body aches and chills which resolved on Wednesday but sore throat has increasingly gotten worse.  She is now having difficulty swallowing liquified yogurt or oatmeal.  When she attempts to drink water it will not go down her throat and it comes out her nose.  Pt wears upper dentures but denies any recent dental problems. No difficulty breathing.  No trismus.  Has been drinking hot tea with lemon and eating cold popsicles with minimal relief of sx.  The history is provided by the patient.    Past Medical History  Diagnosis Date  . GERD (gastroesophageal reflux disease)   . Hyperlipidemia   . Obesity   . Vitamin B 12 deficiency   . Hypoglycemia   . Hypertension   . Anemia     Past Surgical History  Procedure Laterality Date  . Partial hysterectomy  1990's    bleeding  . Knee surgery      left  . Gastric bypass  08/2003  . Hernia repair    . Abdominal hysterectomy      Family History  Problem Relation Age of Onset  . Heart disease Mother     MI  . Hypertension Mother   . Alcohol abuse Father   . Heart disease Father     smoker  . Hypertension Father   . Breast cancer Paternal Grandmother   . Diabetes Maternal Aunt     History  Substance Use Topics  . Smoking status: Never Smoker   . Smokeless tobacco: Never Used  . Alcohol Use: No    OB History   Grav Para Term Preterm Abortions TAB SAB Ect Mult Living   2 2        2       Review of Systems  Constitutional: Positive for chills.  HENT: Positive for sore throat and trouble swallowing.   All other systems reviewed and are  negative.    Allergies  Crestor; Lipitor; Penicillins; and Zocor  Home Medications   Current Outpatient Rx  Name  Route  Sig  Dispense  Refill  . BIOTIN PO   Oral   Take 1 tablet by mouth daily.         Marland Kitchen CALCIUM PO   Oral   Take by mouth daily.           . cholecalciferol (VITAMIN D) 400 UNITS TABS   Oral   Take 400 Units by mouth daily.          . cyanocobalamin (,VITAMIN B-12,) 1000 MCG/ML injection   Intramuscular   Inject 1,000 mcg into the muscle once. Every 2 months          . ergocalciferol (VITAMIN D2) 50000 UNITS capsule   Oral   Take 50,000 Units by mouth once a week. Times 10 weeks.          Marland Kitchen estropipate (OGEN) 1.5 MG tablet   Oral   Take 0.5 tablets (0.75 mg total) by mouth daily.   30 tablet   12     Pt. Needs to keep her appointment 06-09-11 for more ...   Marland Kitchen  hydrochlorothiazide (HYDRODIURIL) 25 MG tablet      TAKE ONE TABLET BY MOUTH EVERY DAY   90 tablet   1   . Multiple Vitamin (MULTIVITAMIN) capsule   Oral   Take 1 capsule by mouth daily.           . Omega-3 Fatty Acids (FISH OIL PO)   Oral   Take by mouth daily.           Marland Kitchen omeprazole (PRILOSEC) 20 MG capsule      TAKE ONE CAPSULE BY MOUTH EVERY DAY   90 capsule   1   . vitamin E 800 UNIT capsule   Oral   Take 800 Units by mouth daily.           Marland Kitchen zinc gluconate 50 MG tablet   Oral   Take 50 mg by mouth daily.             BP 151/69  Pulse 84  Temp(Src) 98.8 F (37.1 C) (Oral)  Resp 16  SpO2 98%  LMP 06/08/1989  Physical Exam  Nursing note and vitals reviewed. Constitutional: She is oriented to person, place, and time. She appears well-developed and well-nourished. No distress.  HENT:  Head: Normocephalic and atraumatic.  Mouth/Throat: Mucous membranes are normal. She has dentures (upper). Tonsillar abscesses present.  Right PTA, uvula deviated left, difficulty swallowing but handling secretions appropriately, airway patent  Eyes: Conjunctivae and  EOM are normal. Pupils are equal, round, and reactive to light.  Neck: Normal range of motion. Neck supple.  Cardiovascular: Normal rate, regular rhythm and normal heart sounds.   Pulmonary/Chest: Effort normal and breath sounds normal. No respiratory distress. She has no wheezes.  Musculoskeletal: Normal range of motion.  Neurological: She is alert and oriented to person, place, and time.  Skin: Skin is warm and dry.  Psychiatric: She has a normal mood and affect.    ED Course  Procedures (including critical care time)  Labs Reviewed - No data to display No results found.   1. Peritonsillar abscess       MDM   Right PTA present with uvula deviated left.  ENT consulted, Dr. Pollyann Kennedy, abscess drained at bedside.  Pt states she feels better and can swallow appropriately now.  Rx clindamycin, phenergan, and vicodin elixir.  FU with Dr. Pollyann Kennedy in clinic in 2 days. Discussed plan with pt, she agreed.  Return precautions advised.        Garlon Hatchet, PA-C 01/04/13 [phone]

## 2013-01-04 NOTE — Patient Instructions (Addendum)
I am worried about your one sided throat swelling and inability to swallow - a peritonsillar abcess is possible and you need urgent evaluation Go across the street to [facility] for eval by the ER doctor - you may need to see the ENT doctor on call

## 2013-01-04 NOTE — ED Notes (Signed)
[id number]<GE> Expected date:<BR> Expected time:<BR> Means of arrival:<BR> Comments:<BR> Hold

## 2013-01-04 NOTE — Consult Note (Signed)
Reason for Consult:Sore throat Referring Physician: Rolan Bucco, MD  Jennifer Bartlett is an 67 y.o. female.  HPI: 2 day history of right sided sore throat and ear pain following a recent URI. When she tries to swallow it comes out of her nose. No previous throat history.  Past Medical History  Diagnosis Date  . GERD (gastroesophageal reflux disease)   . Hyperlipidemia   . Obesity   . Vitamin B 12 deficiency   . Hypoglycemia   . Hypertension   . Anemia     Past Surgical History  Procedure Laterality Date  . Partial hysterectomy  1990's    bleeding  . Knee surgery      left  . Gastric bypass  08/2003  . Hernia repair    . Abdominal hysterectomy      Family History  Problem Relation Age of Onset  . Heart disease Mother     MI  . Hypertension Mother   . Alcohol abuse Father   . Heart disease Father     smoker  . Hypertension Father   . Breast cancer Paternal Grandmother   . Diabetes Maternal Aunt     Social History:  reports that she has never smoked. She has never used smokeless tobacco. She reports that she does not drink alcohol or use illicit drugs.  Allergies:  Allergies  Allergen Reactions  . Crestor (Rosuvastatin Calcium)     Myalgia   . Lipitor (Atorvastatin Calcium)     Aches and pains  . Penicillins     REACTION: swelling  . Zocor (Simvastatin - High Dose) Other (See Comments)    Muscle/joint aches    Medications: Reviewed  No results found for this or any previous visit (from the past 48 hour(s)).  No results found.  [id number] except as listed in admit H&P  Blood pressure 151/69, pulse 84, temperature 98.8 F (37.1 C), temperature source Oral, resp. rate 16, last menstrual period 06/08/1989, SpO2 98.00%.  PHYSICAL EXAM: Overall appearance:  Healthy appearing, in no distress, "hot potato" voice. Head:  Normocephalic, atraumatic. Ears: External ears normal. Nose: External nose is healthy in appearance. Internal nasal exam free of any  lesions or obstruction. Oral Cavity:  There are no mucosal lesions or masses identified. Oral Pharynx/Hypopharynx/Larynx: Asymmetric swelling and erythema of the right side of the soft palate.  Larynx/Hypopharynx:  Neuro:  No identifiable neurologic deficits. Neck: No palpable neck masses.  Studies Reviewed: none  Procedures: I&D right PTA: 2% xylocaine/with epi was infiltrated into the right soft palate. 18 G needle was used to aspirate about 4 cc pus. Scalpel was then used to incise an opening in the mucosa and a large amount of additional pus drained out. Tonsil hemostat was used to open the cavity widely. She tolerated this well.    Assessment/Plan: S/P I&D of right PTA. Oral antibiotics and pain meds. Follow up with me in 2-3 days, sooner if she gets any worse.   Dante Roudebush 01/04/2013, 3:23 PM

## 2013-01-04 NOTE — ED Notes (Signed)
Pt states she has had a sore throat since Monday. Pt states she had body aches and fever earlier this week that have resolved.  Pt states that since yesterday she has been having difficulty swallowing and can only get down very small amounts of fluid and that any other PO fluids will come back up her nose.  Denies dyspnea.

## 2013-01-04 NOTE — ED Notes (Signed)
Pt states she was sent from PCP to rule out R side cyst.

## 2013-01-04 NOTE — Assessment & Plan Note (Signed)
Pt has one sided pain and swelling (past the mid line) - with inability to get food and liquids down as well as low grade fever Worrisome for abcess Sent to the ER for eval and poss urgent ENT referral

## 2013-01-05 NOTE — ED Provider Notes (Signed)
Medical screening examination/treatment/procedure(s) were performed by non-physician practitioner and as supervising physician I was immediately available for consultation/collaboration.   Elnita Surprenant, MD 01/05/13 0703 

## 2013-01-07 ENCOUNTER — Telehealth: Payer: Self-pay

## 2013-01-07 NOTE — Telephone Encounter (Signed)
Triage Record Num: [id number] Operator: Peri Jefferson Patient Name: Jennifer Bartlett Call Date & Time: 01/06/2013 5:02:20PM Patient Phone: PCP: Karleen Hampshire Copland Patient Gender: Female PCP Fax : [phone] Patient DOB: 07-29-1946 Practice Name: [facility] Reason for Call: Triage note from 01/04/13 at 9:17am by April Finney, RN. Caller: Chrystina/Patient; PCP: Hannah Beat (Family Practice); CB#: [phone]; Call regarding Sore Throat; Wants appt for today. Appt scheduled in epic for today at 12p. Protocol(s) Used: Office Note Recommended Outcome per Protocol: Information Noted and Sent to Office Reason for Outcome: Caller information to office Care Advice: ~ 06/

## 2013-01-07 NOTE — Telephone Encounter (Signed)
Aware-she was seen in Sat clinic

## 2013-01-13 ENCOUNTER — Other Ambulatory Visit: Payer: Self-pay | Admitting: *Deleted

## 2013-01-13 MED ORDER — OMEPRAZOLE 20 MG PO CPDR: DELAYED_RELEASE_CAPSULE | ORAL | Status: DC

## 2013-01-13 NOTE — Telephone Encounter (Signed)
Received faxed refill request from [facility] requesting a refill for a 90 day supply. Refill sent electronically to pharmacy as requested.

## 2013-02-12 ENCOUNTER — Other Ambulatory Visit: Payer: Self-pay | Admitting: Family Medicine

## 2013-03-04 ENCOUNTER — Other Ambulatory Visit: Payer: Self-pay

## 2013-03-04 DIAGNOSIS — Z1231 Encounter for screening mammogram for malignant neoplasm of breast: Secondary | ICD-10-CM

## 2013-03-07 ENCOUNTER — Ambulatory Visit: Payer: 59 | Admitting: Family Medicine

## 2013-03-07 DIAGNOSIS — Z0289 Encounter for other administrative examinations: Secondary | ICD-10-CM

## 2013-03-19 ENCOUNTER — Ambulatory Visit: Payer: Medicare Other

## 2013-04-02 ENCOUNTER — Telehealth: Payer: Self-pay | Admitting: *Deleted

## 2013-04-02 ENCOUNTER — Other Ambulatory Visit: Payer: Self-pay | Admitting: Women's Health

## 2013-04-02 DIAGNOSIS — Z780 Asymptomatic menopausal state: Secondary | ICD-10-CM

## 2013-04-02 MED ORDER — ESTROPIPATE 1.5 MG PO TABS: 1.5000 mg | ORAL_TABLET | Freq: Every day | ORAL | Status: DC

## 2013-04-02 NOTE — Telephone Encounter (Signed)
Okay to take one full tablet daily, she was trying to decrease to eventually stop. Reviewe best to decrease dose in the cold months.

## 2013-04-02 NOTE — Telephone Encounter (Signed)
Telephone call to review HRT/hot flushes. States has had increased hot flushes, poor sleep and generally does not feel well. Like to go back on Ogen 1.5, prescription  reviewed risk for blood clots, strokes and breast cancer and accepts.

## 2013-04-02 NOTE — Telephone Encounter (Signed)
Pt is currently taking 1/2 tablet of ogen, pt is requesting to increase on Ogen to 1 full tablet daily. She has noticed increase hot flashes. Please advise

## 2013-04-02 NOTE — Telephone Encounter (Signed)
Jennifer Bartlett this is correct Rx that would be prescribed correct?

## 2013-05-04 ENCOUNTER — Other Ambulatory Visit: Payer: Self-pay | Admitting: Family Medicine

## 2013-05-11 ENCOUNTER — Other Ambulatory Visit: Payer: Self-pay | Admitting: Family Medicine

## 2013-05-15 ENCOUNTER — Ambulatory Visit (INDEPENDENT_AMBULATORY_CARE_PROVIDER_SITE_OTHER): Payer: 59

## 2013-05-15 DIAGNOSIS — Z23 Encounter for immunization: Secondary | ICD-10-CM

## 2013-05-16 ENCOUNTER — Ambulatory Visit: Payer: Medicare Other

## 2013-05-29 ENCOUNTER — Ambulatory Visit (INDEPENDENT_AMBULATORY_CARE_PROVIDER_SITE_OTHER): Payer: 59

## 2013-05-29 DIAGNOSIS — E538 Deficiency of other specified B group vitamins: Secondary | ICD-10-CM | POA: Diagnosis not present

## 2013-05-29 MED ORDER — CYANOCOBALAMIN 1000 MCG/ML IJ SOLN
1000.0000 ug | Freq: Once | INTRAMUSCULAR | Status: AC
  Administered 2013-05-29: 1000 ug via INTRAMUSCULAR

## 2013-07-29 ENCOUNTER — Ambulatory Visit: Payer: Medicare Other

## 2013-08-16 ENCOUNTER — Other Ambulatory Visit: Payer: Self-pay | Admitting: Family Medicine

## 2013-08-18 NOTE — Telephone Encounter (Signed)
Pt no-showed last 2 appts so Rx declined and pharmacy advised pt need appt.

## 2013-08-25 ENCOUNTER — Encounter: Payer: Self-pay | Admitting: Family Medicine

## 2013-08-25 ENCOUNTER — Other Ambulatory Visit: Payer: Self-pay | Admitting: Family Medicine

## 2013-08-25 ENCOUNTER — Ambulatory Visit (INDEPENDENT_AMBULATORY_CARE_PROVIDER_SITE_OTHER): Payer: 59 | Admitting: Family Medicine

## 2013-08-25 VITALS — BP 132/76 | HR 70 | Temp 98.1°F | Ht 63.5 in | Wt 208.2 lb

## 2013-08-25 DIAGNOSIS — E538 Deficiency of other specified B group vitamins: Secondary | ICD-10-CM

## 2013-08-25 DIAGNOSIS — E7800 Pure hypercholesterolemia, unspecified: Secondary | ICD-10-CM

## 2013-08-25 DIAGNOSIS — E559 Vitamin D deficiency, unspecified: Secondary | ICD-10-CM

## 2013-08-25 DIAGNOSIS — I10 Essential (primary) hypertension: Secondary | ICD-10-CM | POA: Diagnosis not present

## 2013-08-25 LAB — COMPREHENSIVE METABOLIC PANEL
ALK PHOS: 84 U/L (ref 39–117)
ALT: 11 U/L (ref 0–35)
AST: 16 U/L (ref 0–37)
Albumin: 3.7 g/dL (ref 3.5–5.2)
BILIRUBIN TOTAL: 0.4 mg/dL (ref 0.3–1.2)
BUN: 19 mg/dL (ref 6–23)
CO2: 28 mEq/L (ref 19–32)
CREATININE: 0.8 mg/dL (ref 0.4–1.2)
Calcium: 8.6 mg/dL (ref 8.4–10.5)
Chloride: 102 mEq/L (ref 96–112)
GFR: 73.02 mL/min (ref >60.00)
GLUCOSE: 95 mg/dL (ref 70–99)
Potassium: 4 mEq/L (ref 3.5–5.1)
Sodium: 138 mEq/L (ref 135–145)
Total Protein: 7.2 g/dL (ref 6.0–8.3)

## 2013-08-25 LAB — CBC WITH DIFFERENTIAL/PLATELET
BASOS PCT: 0.5 % (ref 0.0–3.0)
Basophils Absolute: 0 10*3/uL (ref 0.0–0.1)
EOS ABS: 0.4 10*3/uL (ref 0.0–0.7)
Eosinophils Relative: 5.1 % — ABNORMAL HIGH (ref 0.0–5.0)
HEMATOCRIT: 35.1 % — AB (ref 36.0–46.0)
HEMOGLOBIN: 11.7 g/dL — AB (ref 12.0–15.0)
LYMPHS ABS: 2.4 10*3/uL (ref 0.7–4.0)
Lymphocytes Relative: 32.3 % (ref 12.0–46.0)
MCHC: 33.2 g/dL (ref 30.0–36.0)
MCV: 83.6 fl (ref 78.0–100.0)
MONO ABS: 0.6 10*3/uL (ref 0.1–1.0)
Monocytes Relative: 7.9 % (ref 3.0–12.0)
NEUTROS ABS: 4 10*3/uL (ref 1.4–7.7)
NEUTROS PCT: 54.2 % (ref 43.0–77.0)
Platelets: 284 10*3/uL (ref 150.0–400.0)
RBC: 4.2 Mil/uL (ref 3.87–5.11)
RDW: 15.3 % — ABNORMAL HIGH (ref 11.5–14.6)
WBC: 7.4 10*3/uL (ref 4.5–10.5)

## 2013-08-25 LAB — LIPID PANEL
CHOL/HDL RATIO: 4
CHOLESTEROL: 212 mg/dL — AB (ref 0–200)
HDL: 58.1 mg/dL (ref >39.00)
TRIGLYCERIDES: 87 mg/dL (ref 0.0–149.0)
VLDL: 17.4 mg/dL (ref 0.0–40.0)

## 2013-08-25 LAB — VITAMIN B12: Vitamin B-12: 437 pg/mL (ref 211–911)

## 2013-08-25 LAB — LDL CHOLESTEROL, DIRECT: Direct LDL: 139.2 mg/dL

## 2013-08-25 LAB — TSH: TSH: 4.68 u[IU]/mL (ref 0.35–5.50)

## 2013-08-25 MED ORDER — CYANOCOBALAMIN 1000 MCG/ML IJ SOLN
1000.0000 ug | Freq: Once | INTRAMUSCULAR | Status: AC
  Administered 2013-08-25: 1000 ug via INTRAMUSCULAR

## 2013-08-25 NOTE — Assessment & Plan Note (Signed)
BP: 132/76 mmHg  bp in fair control at this time  No changes needed Disc lifstyle change with low sodium diet and exercise   Enc wt loss  Lab today

## 2013-08-25 NOTE — Assessment & Plan Note (Signed)
Check lipids today Per pt - her diet has improved quite a bit -reassuring Rev low sat fat diet and goals for lipids

## 2013-08-25 NOTE — Progress Notes (Signed)
Subjective:    Patient ID: Jennifer Bartlett, female    DOB: Oct 30, 1945, 68 y.o.   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 If she can have that with her bariatric surgery    Patient Active Problem List   Diagnosis Date Noted  . Throat pain 01/04/2013  . Colon cancer screening 09/06/2012  . UNSPECIFIED VITAMIN D DEFICIENCY 12/28/2009  . ANEMIA, IRON DEFICIENCY 12/28/2009  . STRESS REACTION, ACUTE, WITH EMOTIONAL DISTURBANCE 12/07/2009  . HYPOGLYCEMIA NOS 05/28/2007  . VITAMIN B12 DEFICIENCY 03/07/2007  . HYPERCHOLESTEROLEMIA 03/07/2007  . OBESITY 03/07/2007  . HYPERTENSION 03/07/2007  . GERD 03/07/2007   Past Medical History  Diagnosis Date  . GERD (gastroesophageal reflux disease)   . Hyperlipidemia   . Obesity   . Vitamin B 12 deficiency   . Hypoglycemia   . Hypertension   . Anemia    Past Surgical History  Procedure Laterality Date  . Partial hysterectomy  1990's    bleeding  . Knee surgery      left  . Gastric bypass  08/2003  . Hernia repair    . Abdominal  hysterectomy     History  Substance Use Topics  . Smoking status: Never Smoker   . Smokeless tobacco: Never Used  . Alcohol Use: No   Family History  Problem Relation Age of Onset  . Heart disease Mother     MI  . Hypertension Mother   . Alcohol abuse Father   . Heart disease Father     smoker  . Hypertension Father   . Breast cancer Paternal Grandmother   . Diabetes Maternal Aunt    Allergies  Allergen Reactions  . Crestor [Rosuvastatin Calcium]     Myalgia   . Lipitor [Atorvastatin Calcium]     Aches and pains  . Penicillins     REACTION: swelling  . Zocor [Simvastatin - High Dose] Other (See Comments)    Muscle/joint aches   Current Outpatient Prescriptions on File Prior to Visit  Medication Sig Dispense Refill  . BIOTIN PO Take 1 tablet by mouth daily.      Marland Kitchen CALCIUM PO Take by mouth daily.        . cholecalciferol (VITAMIN D) 400 UNITS TABS Take 400 Units by mouth daily.       . clindamycin (CLEOCIN) 300 MG capsule Take  1 capsule (300 mg total) by mouth 3 (three) times daily.  30 capsule  0  . cyanocobalamin (,VITAMIN B-12,) 1000 MCG/ML injection Inject 1,000 mcg into the muscle once. Every 2 months       . ergocalciferol (VITAMIN D2) 50000 UNITS capsule Take 50,000 Units by mouth once a week. Times 10 weeks.       Marland Kitchen estropipate (OGEN) 1.5 MG tablet Take 1 tablet (1.5 mg total) by mouth daily.  30 tablet  6  . hydrochlorothiazide (HYDRODIURIL) 25 MG tablet Take 1 tablet (25 mg total) by mouth daily. Follow-up appt required before future refills are given  30 tablet  0  . Hydrocodone-Acetaminophen 10-325 MG/15ML SOLN Take 15 mLs by mouth every 4 (four) hours as needed.  473 mL  0  . Multiple Vitamin (MULTIVITAMIN) capsule Take 1 capsule by mouth daily.        . naproxen sodium (ANAPROX) 220 MG tablet Take 220 mg by mouth 2 (two) times daily with a meal.      . Omega-3 Fatty Acids (FISH OIL PO) Take by mouth daily.        Marland Kitchen omeprazole (PRILOSEC) 20 MG capsule TAKE ONE  CAPSULE BY MOUTH EVERY DAY  90 capsule  1  . promethazine (PHENERGAN) 25 MG suppository Place 1 suppository (25 mg total) rectally every 6 (six) hours as needed for nausea.  12 each  0  . vitamin E 800 UNIT capsule Take 800 Units by mouth daily.        Marland Kitchen zinc gluconate 50 MG tablet Take 50 mg by mouth daily.         No current facility-administered medications on file prior to visit.    Review of Systems Review of Systems  Constitutional: Negative for fever, appetite change, fatigue and unexpected weight change.  Eyes: Negative for pain and visual disturbance.  Respiratory: Negative for cough and shortness of breath.   Cardiovascular: Negative for cp or palpitations    Gastrointestinal: Negative for nausea, diarrhea and constipation.  Genitourinary: Negative for urgency and frequency.  Skin: Negative for pallor or rash   MSK pos for low back pain rad to leg Neurological: Negative for weakness, light-headedness, numbness and headaches.  Hematological: Negative for adenopathy. Does not bruise/bleed easily.  Psychiatric/Behavioral: Negative for dysphoric mood. The patient is not nervous/anxious.         Objective:   Physical Exam  Constitutional: She appears well-developed and well-nourished. No distress.  obese and well appearing   HENT:  Head: Normocephalic and atraumatic.  Right Ear: External ear normal.  Left Ear: External ear normal.  Nose: Nose normal.  Mouth/Throat: Oropharynx is clear and moist.  Eyes: Conjunctivae and EOM are normal. Pupils are equal, round, and reactive to light. Right eye exhibits no discharge. Left eye exhibits no discharge. No scleral icterus.  Neck: Normal range of motion. Neck supple. No JVD present. No thyromegaly present.  Cardiovascular: Normal rate, regular rhythm, normal heart sounds and intact distal pulses.  Exam reveals no gallop.   Pulmonary/Chest: Effort normal and breath sounds normal. No respiratory distress. She has no wheezes. She has no  rales.  Abdominal: Soft. Bowel sounds are normal. She exhibits no distension and no mass. There is no tenderness.  Musculoskeletal: She exhibits no edema and no tenderness.  Lymphadenopathy:    She has no cervical adenopathy.  Neurological: She is alert. She has normal reflexes. No cranial nerve deficit. She exhibits normal muscle tone. Coordination normal.  Skin: Skin is  warm and dry. No rash noted. No erythema. No pallor.  Psychiatric: She has a normal mood and affect.  cheerful          Assessment & Plan:

## 2013-08-25 NOTE — Progress Notes (Signed)
Pre-visit discussion using our clinic review tool. No additional management support is needed unless otherwise documented below in the visit note.  

## 2013-08-25 NOTE — Assessment & Plan Note (Signed)
Level today B12 shot today- unsure if compliant with these

## 2013-08-25 NOTE — Assessment & Plan Note (Signed)
Level today Oral supplementation Disc imp to overall and bone health

## 2013-08-25 NOTE — Patient Instructions (Signed)
Labs today  Keep working on healthy diet and exercise  Do different things for exercise through the week if you can to avoid injuries  Call for an appt with Dr Lorelei Pont if back continues to hurt

## 2013-08-26 LAB — VITAMIN D 25 HYDROXY (VIT D DEFICIENCY, FRACTURES): Vit D, 25-Hydroxy: 33 ng/mL (ref 30–89)

## 2013-09-03 ENCOUNTER — Telehealth: Payer: Self-pay | Admitting: Family Medicine

## 2013-09-03 NOTE — Telephone Encounter (Signed)
Relevant patient education assigned to patient using Emmi. ° °

## 2013-09-25 ENCOUNTER — Encounter: Payer: 59 | Admitting: Women's Health

## 2013-10-10 ENCOUNTER — Encounter: Payer: Self-pay | Admitting: Women's Health

## 2013-11-03 ENCOUNTER — Other Ambulatory Visit: Payer: Self-pay | Admitting: Family Medicine

## 2013-11-10 ENCOUNTER — Other Ambulatory Visit: Payer: Self-pay | Admitting: *Deleted

## 2013-11-10 MED ORDER — HYDROCHLOROTHIAZIDE 25 MG PO TABS: 25.0000 mg | ORAL_TABLET | Freq: Every day | ORAL | Status: DC

## 2013-11-10 NOTE — Telephone Encounter (Signed)
Received fax from pharmacy that pt would like 90 day supply instead of 30 days, Rx changed

## 2013-12-12 ENCOUNTER — Other Ambulatory Visit: Payer: Self-pay | Admitting: *Deleted

## 2013-12-12 DIAGNOSIS — Z780 Asymptomatic menopausal state: Secondary | ICD-10-CM

## 2013-12-12 MED ORDER — ESTROPIPATE 1.5 MG PO TABS: 1.5000 mg | ORAL_TABLET | Freq: Every day | ORAL | Status: DC

## 2013-12-12 NOTE — Telephone Encounter (Signed)
Pt has annual scheduled on 01/01/14

## 2014-01-01 ENCOUNTER — Encounter: Payer: Self-pay | Admitting: Women's Health

## 2014-01-16 ENCOUNTER — Other Ambulatory Visit: Payer: Self-pay | Admitting: Women's Health

## 2014-01-16 DIAGNOSIS — Z780 Asymptomatic menopausal state: Secondary | ICD-10-CM

## 2014-01-16 MED ORDER — ESTROPIPATE 1.5 MG PO TABS: 1.5000 mg | ORAL_TABLET | Freq: Every day | ORAL | Status: DC

## 2014-02-16 ENCOUNTER — Other Ambulatory Visit: Payer: Self-pay

## 2014-02-16 DIAGNOSIS — Z780 Asymptomatic menopausal state: Secondary | ICD-10-CM

## 2014-02-16 MED ORDER — ESTROPIPATE 1.5 MG PO TABS: 1.5000 mg | ORAL_TABLET | Freq: Every day | ORAL | Status: DC

## 2014-02-18 ENCOUNTER — Encounter: Payer: Self-pay | Admitting: Women's Health

## 2014-02-18 ENCOUNTER — Ambulatory Visit (INDEPENDENT_AMBULATORY_CARE_PROVIDER_SITE_OTHER): Payer: Medicare Other | Admitting: Women's Health

## 2014-02-18 VITALS — BP 119/76 | Ht 64.0 in | Wt 213.6 lb

## 2014-02-18 DIAGNOSIS — Z780 Asymptomatic menopausal state: Secondary | ICD-10-CM

## 2014-02-18 DIAGNOSIS — Z01419 Encounter for gynecological examination (general) (routine) without abnormal findings: Secondary | ICD-10-CM | POA: Diagnosis not present

## 2014-02-18 DIAGNOSIS — N951 Menopausal and female climacteric states: Secondary | ICD-10-CM

## 2014-02-18 MED ORDER — ESTROPIPATE 1.5 MG PO TABS: 1.5000 mg | ORAL_TABLET | Freq: Every day | ORAL | Status: DC

## 2014-02-18 NOTE — Patient Instructions (Signed)
Health Recommendations for Postmenopausal Women Respected and ongoing research has looked at the most common causes of death, disability, and poor quality of life in postmenopausal women. The causes include heart disease, diseases of blood vessels, diabetes, depression, cancer, and bone loss (osteoporosis). Many things can be done to help lower the chances of developing these and other common problems: CARDIOVASCULAR DISEASE Heart Disease: A heart attack is a medical emergency. Know the signs and symptoms of a heart attack. Below are things women can do to reduce their risk for heart disease.   Do not smoke. If you smoke, quit.  Aim for a healthy weight. Being overweight causes many preventable deaths. Eat a healthy and balanced diet and drink an adequate amount of liquids.  Get moving. Make a commitment to be more physically active. Aim for 30 minutes of activity on most, if not all days of the week.  Eat for heart health. Choose a diet that is low in saturated fat and cholesterol and eliminate trans fat. Include whole grains, vegetables, and fruits. Read and understand the labels on food containers before buying.  Know your numbers. Ask your caregiver to check your blood pressure, cholesterol (total, HDL, LDL, triglycerides) and blood glucose. Work with your caregiver on improving your entire clinical picture.  High blood pressure. Limit or stop your table salt intake (try salt substitute and food seasonings). Avoid salty foods and drinks. Read labels on food containers before buying. Eating well and exercising can help control high blood pressure. STROKE  Stroke is a medical emergency. Stroke may be the result of a blood clot in a blood vessel in the brain or by a brain hemorrhage (bleeding). Know the signs and symptoms of a stroke. To lower the risk of developing a stroke:  Avoid fatty foods.  Quit smoking.  Control your diabetes, blood pressure, and irregular heart rate. THROMBOPHLEBITIS  (BLOOD CLOT) OF THE LEG  Becoming overweight and leading a stationary lifestyle may also contribute to developing blood clots. Controlling your diet and exercising will help lower the risk of developing blood clots. CANCER SCREENING  Breast Cancer: Take steps to reduce your risk of breast cancer.  You should practice "breast self-awareness." This means understanding the normal appearance and feel of your breasts and should include breast self-examination. Any changes detected, no matter how small, should be reported to your caregiver.  After age 40, you should have a clinical breast exam (CBE) every year.  Starting at age 40, you should consider having a mammogram (breast X-ray) every year.  If you have a family history of breast cancer, talk to your caregiver about genetic screening.  If you are at high risk for breast cancer, talk to your caregiver about having an MRI and a mammogram every year.  Intestinal or Stomach Cancer: Tests to consider are a rectal exam, fecal occult blood, sigmoidoscopy, and colonoscopy. Women who are high risk may need to be screened at an earlier age and more often.  Cervical Cancer:  Beginning at age 30, you should have a Pap test every 3 years as long as the past 3 Pap tests have been normal.  If you have had past treatment for cervical cancer or a condition that could lead to cancer, you need Pap tests and screening for cancer for at least 20 years after your treatment.  If you had a hysterectomy for a problem that was not cancer or a condition that could lead to cancer, then you no longer need Pap tests.    If you are between ages 65 and 70, and you have had normal Pap tests going back 10 years, you no longer need Pap tests.  If Pap tests have been discontinued, risk factors (such as a new sexual partner) need to be reassessed to determine if screening should be resumed.  Some medical problems can increase the chance of getting cervical cancer. In these  cases, your caregiver may recommend more frequent screening and Pap tests.  Uterine Cancer: If you have vaginal bleeding after reaching menopause, you should notify your caregiver.  Ovarian cancer: Other than yearly pelvic exams, there are no reliable tests available to screen for ovarian cancer at this time except for yearly pelvic exams.  Lung Cancer: Yearly chest X-rays can detect lung cancer and should be done on high risk women, such as cigarette smokers and women with chronic lung disease (emphysema).  Skin Cancer: A complete body skin exam should be done at your yearly examination. Avoid overexposure to the sun and ultraviolet light lamps. Use a strong sun block cream when in the sun. All of these things are important in lowering the risk of skin cancer. MENOPAUSE Menopause Symptoms: Hormone therapy products are effective for treating symptoms associated with menopause:  Moderate to severe hot flashes.  Night sweats.  Mood swings.  Headaches.  Tiredness.  Loss of sex drive.  Insomnia.  Other symptoms. Hormone replacement carries certain risks, especially in older women. Women who use or are thinking about using estrogen or estrogen with progestin treatments should discuss that with their caregiver. Your caregiver will help you understand the benefits and risks. The ideal dose of hormone replacement therapy is not known. The Food and Drug Administration (FDA) has concluded that hormone therapy should be used only at the lowest doses and for the shortest amount of time to reach treatment goals.  OSTEOPOROSIS Protecting Against Bone Loss and Preventing Fracture: If you use hormone therapy for prevention of bone loss (osteoporosis), the risks for bone loss must outweigh the risk of the therapy. Ask your caregiver about other medications known to be safe and effective for preventing bone loss and fractures. To guard against bone loss or fractures, the following is recommended:  If  you are less than age 50, take 1000 mg of calcium and at least 600 mg of Vitamin D per day.  If you are greater than age 50 but less than age 70, take 1200 mg of calcium and at least 600 mg of Vitamin D per day.  If you are greater than age 70, take 1200 mg of calcium and at least 800 mg of Vitamin D per day. Smoking and excessive alcohol intake increases the risk of osteoporosis. Eat foods rich in calcium and vitamin D and do weight bearing exercises several times a week as your caregiver suggests. DIABETES Diabetes Melitus: If you have Type I or Type 2 diabetes, you should keep your blood sugar under control with diet, exercise and recommended medication. Avoid too many sweets, starchy and fatty foods. Being overweight can make control more difficult. COGNITION AND MEMORY Cognition and Memory: Menopausal hormone therapy is not recommended for the prevention of cognitive disorders such as Alzheimer's disease or memory loss.  DEPRESSION  Depression may occur at any age, but is common in elderly women. The reasons may be because of physical, medical, social (loneliness), or financial problems and needs. If you are experiencing depression because of medical problems and control of symptoms, talk to your caregiver about this. Physical activity and   exercise may help with mood and sleep. Community and volunteer involvement may help your sense of value and worth. If you have depression and you feel that the problem is getting worse or becoming severe, talk to your caregiver about treatment options that are best for you. ACCIDENTS  Accidents are common and can be serious in the elderly woman. Prepare your house to prevent accidents. Eliminate throw rugs, place hand bars in the bath, shower and toilet areas. Avoid wearing high heeled shoes or walking on wet, snowy, and icy areas. Limit or stop driving if you have vision or hearing problems, or you feel you are unsteady with you movements and  reflexes. HEPATITIS C Hepatitis C is a type of viral infection affecting the liver. It is spread mainly through contact with blood from an infected person. It can be treated, but if left untreated, it can lead to severe liver damage over years. Many people who are infected do not know that the virus is in their blood. If you are a "baby-boomer", it is recommended that you have one screening test for Hepatitis C. IMMUNIZATIONS  Several immunizations are important to consider having during your senior years, including:   Tetanus, diptheria, and pertussis booster shot.  Influenza every year before the flu season begins.  Pneumonia vaccine.  Shingles vaccine.  Others as indicated based on your specific needs. Talk to your caregiver about these. Document Released: 09/08/2005 Document Revised: 07/03/2012 Document Reviewed: 05/04/2008 [facility] Patient Information 2015 [place], [place]. This information is not intended to replace advice given to you by your health care provider. Make sure you discuss any questions you have with your health care provider.

## 2014-02-18 NOTE — Progress Notes (Signed)
Jennifer Bartlett 20-Dec-1945 [id number]    History:    Presents for breast and pelvic exam. TAH for DUB on Ogen 1.5 daily has tried to decrease in the past with increased hot flushes. Normal Pap and mammogram history, and overdue for mammogram/2012. Primary care manages hypertension/hypercholesterolemia and reports DEXA as normal. Had zostavac and Pneumovax. History of gastric bypass has not had a colonoscopy.  Past medical history, past surgical history, family history and social history were all reviewed and documented in the EPIC chart. Retired, adopted daughter Loree Fee and her husband and 2 children live with her.   ROS:  A  12 point ROS was performed and pertinent positives and negatives are included.  Exam:  Filed Vitals:   02/18/14 0858  BP: 119/76    General appearance:  Normal Thyroid:  Symmetrical, normal in size, without palpable masses or nodularity. Respiratory  Auscultation:  Clear without wheezing or rhonchi Cardiovascular  Auscultation:  Regular rate, without rubs, murmurs or gallops  Edema/varicosities:  Not grossly evident Abdominal  Soft,nontender, without masses, guarding or rebound.  Liver/spleen:  No organomegaly noted  Hernia:  None appreciated  Skin  Inspection:  Grossly normal   Breasts: Examined lying and sitting.     Right: Without masses, retractions, discharge or axillary adenopathy.     Left: Without masses, retractions, discharge or axillary adenopathy. Gentitourinary   Inguinal/mons:  Normal without inguinal adenopathy  External genitalia:  Normal  BUS/Urethra/Skene's glands:  Normal  Vagina:  Normal  Cervix:  Absent  Uterus:  Absent  Adnexa/parametria:     Rt: Without masses or tenderness.   Lt: Without masses or tenderness.  Anus and perineum: Normal  Digital rectal exam: Normal sphincter tone without palpated masses or tenderness  Assessment/Plan:  68 y.o. MWF G2P2 + 1 adopted daughter  for breast and pelvic exam.  TAH on  ERT Hypertension/hypercholesterolemia-primary care manages Obesity/gastric bypass   Plan: SBE's, reviewed importance of annual screening mammogram, 3-D tomography reviewed and encouraged, history of dense breast. Continue to cut calories and increasing regular exercise. 13 1.5 prescription, proper use given and reviewed risks of blood clots, strokes, breast cancer. Reviewed importance of decreasing, will try to decrease/ quit when weather is cooler. Reports numerous hot flashes when has tried to quit in the past.   Note: This dictation was prepared with Dragon/digital dictation.  Any transcriptional errors that result are unintentional. Huel C[facility], 9:31 AM 02/18/2014

## 2014-04-17 ENCOUNTER — Telehealth: Payer: Self-pay

## 2014-04-17 ENCOUNTER — Other Ambulatory Visit: Payer: Self-pay | Admitting: Family Medicine

## 2014-04-17 NOTE — Telephone Encounter (Signed)
She can go back to B12 shots every 2 mo if she wants to -please schedule

## 2014-04-17 NOTE — Telephone Encounter (Signed)
b12 appt scheduled

## 2014-04-17 NOTE — Telephone Encounter (Signed)
Pt feeling tired and request to restart Vit B 12 injections; pt last injection was 08/25/2013; pt was supposed to taking q 2 months but pt has been taking OTC B 12.Please advise.

## 2014-04-20 ENCOUNTER — Ambulatory Visit (INDEPENDENT_AMBULATORY_CARE_PROVIDER_SITE_OTHER): Payer: 59

## 2014-04-20 ENCOUNTER — Ambulatory Visit (INDEPENDENT_AMBULATORY_CARE_PROVIDER_SITE_OTHER): Payer: Medicare Other

## 2014-04-20 DIAGNOSIS — E538 Deficiency of other specified B group vitamins: Secondary | ICD-10-CM | POA: Diagnosis not present

## 2014-04-20 DIAGNOSIS — Z23 Encounter for immunization: Secondary | ICD-10-CM | POA: Diagnosis not present

## 2014-04-20 MED ORDER — CYANOCOBALAMIN 1000 MCG/ML IJ SOLN
1000.0000 ug | Freq: Once | INTRAMUSCULAR | Status: AC
  Administered 2014-04-20: 1000 ug via INTRAMUSCULAR

## 2014-05-21 ENCOUNTER — Ambulatory Visit: Payer: 59

## 2014-06-01 ENCOUNTER — Encounter: Payer: Self-pay | Admitting: Women's Health

## 2014-06-03 ENCOUNTER — Ambulatory Visit (INDEPENDENT_AMBULATORY_CARE_PROVIDER_SITE_OTHER): Payer: Medicare Other

## 2014-06-03 DIAGNOSIS — E538 Deficiency of other specified B group vitamins: Secondary | ICD-10-CM

## 2014-06-03 MED ORDER — CYANOCOBALAMIN 1000 MCG/ML IJ SOLN
1000.0000 ug | Freq: Once | INTRAMUSCULAR | Status: AC
  Administered 2014-06-03: 1000 ug via INTRAMUSCULAR

## 2014-06-23 ENCOUNTER — Other Ambulatory Visit: Payer: Self-pay

## 2014-06-23 NOTE — Telephone Encounter (Signed)
Please refill for 90 days and schedule appt for early feb

## 2014-06-23 NOTE — Telephone Encounter (Signed)
Pt left v/m requesting 90 day refill fluid pills to [facility]. Pt last seen 08/25/13 and no future appt scheduled.Please advise.

## 2014-06-24 MED ORDER — HYDROCHLOROTHIAZIDE 25 MG PO TABS: 25.0000 mg | ORAL_TABLET | Freq: Every day | ORAL | Status: DC

## 2014-06-26 ENCOUNTER — Ambulatory Visit: Payer: 59

## 2014-07-15 ENCOUNTER — Other Ambulatory Visit: Payer: Self-pay | Admitting: Family Medicine

## 2014-07-15 NOTE — Telephone Encounter (Signed)
Electronic refill request, no recent/future appt., please advise  

## 2014-07-16 NOTE — Telephone Encounter (Signed)
Please schedule f/u or annual exam and refill until then

## 2014-07-17 NOTE — Telephone Encounter (Signed)
Left voicemail requesting pt to schedule appt so we can refill her Rx

## 2014-07-21 NOTE — Telephone Encounter (Signed)
Appt has been scheduled. Rx filled.

## 2014-08-05 ENCOUNTER — Ambulatory Visit (INDEPENDENT_AMBULATORY_CARE_PROVIDER_SITE_OTHER): Payer: Medicare Other

## 2014-08-05 DIAGNOSIS — E538 Deficiency of other specified B group vitamins: Secondary | ICD-10-CM | POA: Diagnosis not present

## 2014-08-05 MED ORDER — CYANOCOBALAMIN 1000 MCG/ML IJ SOLN
1000.0000 ug | Freq: Once | INTRAMUSCULAR | Status: AC
  Administered 2014-08-05: 1000 ug via INTRAMUSCULAR

## 2014-08-17 ENCOUNTER — Ambulatory Visit (INDEPENDENT_AMBULATORY_CARE_PROVIDER_SITE_OTHER): Payer: Medicare Other | Admitting: Family Medicine

## 2014-08-17 ENCOUNTER — Encounter: Payer: Self-pay | Admitting: Family Medicine

## 2014-08-17 VITALS — BP 138/70 | HR 68 | Temp 98.2°F | Ht 64.0 in | Wt 211.0 lb

## 2014-08-17 DIAGNOSIS — I10 Essential (primary) hypertension: Secondary | ICD-10-CM

## 2014-08-17 DIAGNOSIS — E7800 Pure hypercholesterolemia, unspecified: Secondary | ICD-10-CM

## 2014-08-17 DIAGNOSIS — Z23 Encounter for immunization: Secondary | ICD-10-CM

## 2014-08-17 DIAGNOSIS — E559 Vitamin D deficiency, unspecified: Secondary | ICD-10-CM | POA: Diagnosis not present

## 2014-08-17 DIAGNOSIS — E538 Deficiency of other specified B group vitamins: Secondary | ICD-10-CM

## 2014-08-17 LAB — CBC WITH DIFFERENTIAL/PLATELET
BASOS ABS: 0.1 10*3/uL (ref 0.0–0.1)
Basophils Relative: 0.7 % (ref 0.0–3.0)
Eosinophils Absolute: 0.4 10*3/uL (ref 0.0–0.7)
Eosinophils Relative: 4.7 % (ref 0.0–5.0)
HCT: 36.7 % (ref 36.0–46.0)
Hemoglobin: 11.7 g/dL — ABNORMAL LOW (ref 12.0–15.0)
Lymphocytes Relative: 34.6 % (ref 12.0–46.0)
Lymphs Abs: 2.8 10*3/uL (ref 0.7–4.0)
MCHC: 32 g/dL (ref 30.0–36.0)
MCV: 85.3 fl (ref 78.0–100.0)
MONOS PCT: 7.5 % (ref 3.0–12.0)
Monocytes Absolute: 0.6 10*3/uL (ref 0.1–1.0)
NEUTROS ABS: 4.3 10*3/uL (ref 1.4–7.7)
Neutrophils Relative %: 52.5 % (ref 43.0–77.0)
Platelets: 276 10*3/uL (ref 150.0–400.0)
RBC: 4.31 Mil/uL (ref 3.87–5.11)
RDW: 15.8 % — AB (ref 11.5–15.5)
WBC: 8.2 10*3/uL (ref 4.0–10.5)

## 2014-08-17 LAB — COMPREHENSIVE METABOLIC PANEL
ALK PHOS: 105 U/L (ref 39–117)
ALT: 10 U/L (ref 0–35)
AST: 16 U/L (ref 0–37)
Albumin: 3.6 g/dL (ref 3.5–5.2)
BUN: 17 mg/dL (ref 6–23)
CO2: 29 meq/L (ref 19–32)
Calcium: 8.8 mg/dL (ref 8.4–10.5)
Chloride: 101 mEq/L (ref 96–112)
Creatinine, Ser: 0.82 mg/dL (ref 0.40–1.20)
GFR: 73.83 mL/min (ref >60.00)
Glucose, Bld: 114 mg/dL — ABNORMAL HIGH (ref 70–99)
POTASSIUM: 3.8 meq/L (ref 3.5–5.1)
SODIUM: 138 meq/L (ref 135–145)
TOTAL PROTEIN: 7 g/dL (ref 6.0–8.3)
Total Bilirubin: 0.3 mg/dL (ref 0.2–1.2)

## 2014-08-17 LAB — LIPID PANEL
CHOL/HDL RATIO: 4
Cholesterol: 202 mg/dL — ABNORMAL HIGH (ref 0–200)
HDL: 56.9 mg/dL (ref >39.00)
LDL Cholesterol: 127 mg/dL — ABNORMAL HIGH (ref 0–99)
NonHDL: 145.1
Triglycerides: 92 mg/dL (ref 0.0–149.0)
VLDL: 18.4 mg/dL (ref 0.0–40.0)

## 2014-08-17 LAB — VITAMIN B12: VITAMIN B 12: 548 pg/mL (ref 211–911)

## 2014-08-17 LAB — TSH: TSH: 7.89 u[IU]/mL — AB (ref 0.35–4.50)

## 2014-08-17 LAB — VITAMIN D 25 HYDROXY (VIT D DEFICIENCY, FRACTURES): VITD: 28.02 ng/mL — ABNORMAL LOW (ref 30.00–100.00)

## 2014-08-17 MED ORDER — OMEPRAZOLE 20 MG PO CPDR: 20.0000 mg | DELAYED_RELEASE_CAPSULE | Freq: Every day | ORAL | Status: DC

## 2014-08-17 MED ORDER — HYDROCHLOROTHIAZIDE 25 MG PO TABS: 25.0000 mg | ORAL_TABLET | Freq: Every day | ORAL | Status: DC

## 2014-08-17 NOTE — Progress Notes (Signed)
Subjective:    Patient ID: Jennifer Bartlett, female    DOB: Mar 06, 1946, 69 y.o.   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  Doing things around the house  Husband retired in April- much less stress  She started taking quilting classes   bp is stable today  No cp or palpitations or headaches or edema  No side effects to medicines  BP Readings from Last 3 Encounters:  08/17/14 142/82  02/18/14 119/76  08/25/13 132/76      Hyperlipidemia Lab Results  Component Value Date   CHOL 212* 08/25/2013   HDL 58.10 08/25/2013   LDLCALC 107* 08/29/2012   LDLDIRECT 139.2 08/25/2013   TRIG 87.0 08/25/2013   CHOLHDL 4 08/25/2013   Hx of b12 and D def  Has had wt loss surgery  Has not missed B12 shots   Wt is down 2 lb with bmi of 36 She has been working hard on it -- by her scales 12 lb  Even did well over the holidays  She exercises - more walking   Had her flu shot  Will be due for Tdap   Patient Active Problem List   Diagnosis Date Noted  . Colon cancer screening 09/06/2012  . UNSPECIFIED VITAMIN D DEFICIENCY 12/28/2009  . STRESS REACTION, ACUTE, WITH EMOTIONAL DISTURBANCE 12/07/2009  . HYPOGLYCEMIA NOS 05/28/2007  . VITAMIN B12 DEFICIENCY 03/07/2007  . HYPERCHOLESTEROLEMIA 03/07/2007  . OBESITY 03/07/2007  . HYPERTENSION 03/07/2007  . GERD 03/07/2007   Past Medical History  Diagnosis Date  . GERD (gastroesophageal reflux disease)   . Hyperlipidemia   . Obesity   . Vitamin B 12 deficiency   . Hypoglycemia   . Hypertension   . Anemia    Past Surgical History  Procedure Laterality Date  . Partial hysterectomy  1990's    bleeding  . Knee surgery      left  . Gastric bypass  08/2003  . Hernia repair    . Abdominal hysterectomy     History  Substance Use Topics  . Smoking status: Never Smoker   . Smokeless tobacco: Never Used  . Alcohol Use: No   Family History  Problem Relation Age  of Onset  . Heart disease Mother     MI  . Hypertension Mother   . Alcohol abuse Father   . Heart disease Father     smoker  . Hypertension Father   . Breast cancer Paternal Grandmother   . Diabetes Maternal Aunt    Allergies  Allergen Reactions  . Crestor [Rosuvastatin Calcium]     Myalgia   . Lipitor [Atorvastatin Calcium]     Aches and pains  . Penicillins     REACTION: swelling  . Zocor [Simvastatin - High Dose] Other (See Comments)    Muscle/joint aches   Current Outpatient Prescriptions on File Prior to Visit  Medication Sig Dispense Refill  . BIOTIN PO Take 1 tablet by mouth daily.    Marland Kitchen CALCIUM PO Take by mouth daily.      . cholecalciferol (VITAMIN D) 400 UNITS TABS Take 400 Units by mouth daily.     . clindamycin (CLEOCIN) 300 MG capsule Take 1 capsule (300 mg total) by mouth 3 (three) times daily. 30 capsule 0  . cyanocobalamin (,VITAMIN B-12,) 1000 MCG/ML injection Inject 1,000 mcg into the muscle once. Every 2 months     . ergocalciferol (VITAMIN D2)  50000 UNITS capsule Take 50,000 Units by mouth once a week. Times 10 weeks.     Marland Kitchen estropipate (OGEN) 1.5 MG tablet Take 1 tablet (1.5 mg total) by mouth daily. 30 tablet 12  . hydrochlorothiazide (HYDRODIURIL) 25 MG tablet Take 1 tablet (25 mg total) by mouth daily. 90 tablet 0  . Hydrocodone-Acetaminophen 10-325 MG/15ML SOLN Take 15 mLs by mouth every 4 (four) hours as needed. 473 mL 0  . Multiple Vitamin (MULTIVITAMIN) capsule Take 1 capsule by mouth daily.      . Omega-3 Fatty Acids (FISH OIL PO) Take by mouth daily.      Marland Kitchen omeprazole (PRILOSEC) 20 MG capsule TAKE ONE CAPSULE BY MOUTH EVERY DAY 90 capsule 1  . promethazine (PHENERGAN) 25 MG suppository Place 1 suppository (25 mg total) rectally every 6 (six) hours as needed for nausea. 12 each 0  . vitamin E 800 UNIT capsule Take 800 Units by mouth daily.      Marland Kitchen zinc gluconate 50 MG tablet Take 50 mg by mouth daily.       No current facility-administered medications  on file prior to visit.     Review of Systems Review of Systems  Constitutional: Negative for fever, appetite change, fatigue and unexpected weight change.  Eyes: Negative for pain and visual disturbance.  Respiratory: Negative for cough and shortness of breath.   Cardiovascular: Negative for cp or palpitations    Gastrointestinal: Negative for nausea, diarrhea and constipation.  Genitourinary: Negative for urgency and frequency.  Skin: Negative for pallor or rash   Neurological: Negative for weakness, light-headedness, numbness and headaches.  Hematological: Negative for adenopathy. Does not bruise/bleed easily.  Psychiatric/Behavioral: Negative for dysphoric mood. The patient is not nervous/anxious.         Objective:   Physical Exam  Constitutional: She appears well-developed and well-nourished. No distress.  obese and well appearing   HENT:  Head: Normocephalic and atraumatic.  Mouth/Throat: Oropharynx is clear and moist.  Eyes: Conjunctivae and EOM are normal. Pupils are equal, round, and reactive to light. Right eye exhibits no discharge. Left eye exhibits no discharge. No scleral icterus.  Neck: Normal range of motion. Neck supple. No JVD present. No thyromegaly present.  Cardiovascular: Normal rate, regular rhythm, normal heart sounds and intact distal pulses.  Exam reveals no gallop.   Pulmonary/Chest: Effort normal and breath sounds normal. No respiratory distress. She has no wheezes. She has no rales.  Abdominal: Soft. Bowel sounds are normal. She exhibits no distension and no mass. There is no tenderness.  Musculoskeletal: She exhibits no edema or tenderness.  Lymphadenopathy:    She has no cervical adenopathy.  Neurological: She is alert. She has normal reflexes. No cranial nerve deficit. She exhibits normal muscle tone. Coordination normal.  Skin: Skin is warm and dry. No rash noted. No erythema. No pallor.  Psychiatric: She has a normal mood and affect.           Assessment & Plan:   Problem List Items Addressed This Visit      Cardiovascular and Mediastinum   Essential hypertension - Primary    bp in fair control at this time  BP Readings from Last 1 Encounters:  08/17/14 138/70   No changes needed Disc lifstyle change with low sodium diet and exercise  Lab today       Relevant Medications   hydrochlorothiazide tablet   Other Relevant Orders   CBC with Differential (Completed)   Comprehensive metabolic panel (Completed)  TSH (Completed)   Lipid panel (Completed)     Digestive   Vitamin B12 deficiency    Level today in pt who is s/p bariatric surgery  She takes shots reliably         Relevant Orders   Vitamin B12 (Completed)     Other   HYPERCHOLESTEROLEMIA    Due for lipid check  Diet is fair  Pt is s/p bariatric surgery Rev low sat fat diet  Lab today      Relevant Medications   hydrochlorothiazide tablet   Other Relevant Orders   Lipid panel (Completed)   Vitamin D deficiency    Level today in pt with hx of bariatric surgery  Not a lot of sun exp  Disc imp to bone and overall health       Relevant Orders   Vit D  25 hydroxy (rtn osteoporosis monitoring) (Completed)    Other Visit Diagnoses    Need for pneumococcal vaccination        Relevant Orders    Pneumococcal conjugate vaccine 13-valent IM

## 2014-08-17 NOTE — Progress Notes (Signed)
Pre visit review using our clinic review tool, if applicable. No additional management support is needed unless otherwise documented below in the visit note. 

## 2014-08-17 NOTE — Assessment & Plan Note (Signed)
Level today in pt with hx of bariatric surgery  Not a lot of sun exp  Disc imp to bone and overall health

## 2014-08-17 NOTE — Patient Instructions (Addendum)
prevnar vaccine today  Call your insurance about coverage of a tetanus (Tdap)  Labs today   Keep working on healthy diet and exercise

## 2014-08-17 NOTE — Assessment & Plan Note (Signed)
bp in fair control at this time  BP Readings from Last 1 Encounters:  08/17/14 138/70   No changes needed Disc lifstyle change with low sodium diet and exercise  Lab today

## 2014-08-17 NOTE — Assessment & Plan Note (Signed)
Level today in pt who is s/p bariatric surgery  She takes shots reliably

## 2014-08-17 NOTE — Assessment & Plan Note (Signed)
Due for lipid check  Diet is fair  Pt is s/p bariatric surgery Rev low sat fat diet  Lab today

## 2014-08-18 ENCOUNTER — Other Ambulatory Visit (INDEPENDENT_AMBULATORY_CARE_PROVIDER_SITE_OTHER): Payer: Medicare Other

## 2014-08-18 DIAGNOSIS — Z23 Encounter for immunization: Secondary | ICD-10-CM | POA: Diagnosis not present

## 2014-08-18 DIAGNOSIS — E079 Disorder of thyroid, unspecified: Secondary | ICD-10-CM

## 2014-08-18 LAB — T4, FREE: Free T4: 1.11 ng/dL (ref 0.60–1.60)

## 2014-09-11 ENCOUNTER — Ambulatory Visit: Payer: Medicare Other

## 2014-09-25 ENCOUNTER — Other Ambulatory Visit: Payer: Self-pay | Admitting: Family Medicine

## 2014-10-07 ENCOUNTER — Ambulatory Visit (INDEPENDENT_AMBULATORY_CARE_PROVIDER_SITE_OTHER): Payer: 59 | Admitting: *Deleted

## 2014-10-07 DIAGNOSIS — E538 Deficiency of other specified B group vitamins: Secondary | ICD-10-CM

## 2014-10-07 MED ORDER — CYANOCOBALAMIN 1000 MCG/ML IJ SOLN
1000.0000 ug | Freq: Once | INTRAMUSCULAR | Status: AC
  Administered 2014-10-07: 1000 ug via INTRAMUSCULAR

## 2014-10-08 ENCOUNTER — Ambulatory Visit: Payer: Medicare Other

## 2014-10-20 ENCOUNTER — Telehealth: Payer: Self-pay | Admitting: *Deleted

## 2014-10-20 NOTE — Telephone Encounter (Signed)
Patient called stating that she had a spell yesterday where her sugar may have dropped and her mouth and tongue went numb while she was shopping at [employer]. Patient stated that this has happened before with her sugar dropping but never had this numbness. Patient stated that she ate some candy and she got okay once it got into her system. Patient wants to know if she should come in to discuss this or what should she do?. Patient stated that she is feeling okay today, but a little tired.

## 2014-10-20 NOTE — Telephone Encounter (Signed)
This can happen with hypoglycemia  To avoid it - she may need to eat smaller more frequent meals with protein  It episodes continue however please f/u

## 2014-10-20 NOTE — Telephone Encounter (Signed)
Spoke with patient regarding her spell.  She does try to eat right but may be stretching between meals to long.  She will keep track and if this happens again she will make an appointment.

## 2014-12-10 ENCOUNTER — Ambulatory Visit (INDEPENDENT_AMBULATORY_CARE_PROVIDER_SITE_OTHER): Payer: Medicare Other

## 2014-12-10 DIAGNOSIS — E538 Deficiency of other specified B group vitamins: Secondary | ICD-10-CM

## 2014-12-10 MED ORDER — CYANOCOBALAMIN 1000 MCG/ML IJ SOLN
1000.0000 ug | Freq: Once | INTRAMUSCULAR | Status: AC
  Administered 2014-12-10: 1000 ug via INTRAMUSCULAR

## 2015-02-03 ENCOUNTER — Telehealth: Payer: Self-pay | Admitting: Family Medicine

## 2015-02-03 ENCOUNTER — Other Ambulatory Visit: Payer: Self-pay

## 2015-02-03 ENCOUNTER — Telehealth: Payer: Self-pay | Admitting: *Deleted

## 2015-02-03 DIAGNOSIS — Z1231 Encounter for screening mammogram for malignant neoplasm of breast: Secondary | ICD-10-CM

## 2015-02-03 NOTE — Telephone Encounter (Signed)
Lm on pts vm regarding scheduling due mammogram

## 2015-02-03 NOTE — Telephone Encounter (Signed)
Patient returned Jennifer Bartlett's call about scheduling her mammogram.  Patient said she's going to call [facility] and schedule her appointment.

## 2015-02-05 ENCOUNTER — Ambulatory Visit
Admission: RE | Admit: 2015-02-05 | Discharge: 2015-02-05 | Disposition: A | Discharge status: Home / Self Care | Payer: Medicare Other | Source: Ambulatory Visit

## 2015-02-05 ENCOUNTER — Other Ambulatory Visit: Payer: Self-pay | Admitting: Women's Health

## 2015-02-05 DIAGNOSIS — R928 Other abnormal and inconclusive findings on diagnostic imaging of breast: Secondary | ICD-10-CM

## 2015-02-05 DIAGNOSIS — Z1231 Encounter for screening mammogram for malignant neoplasm of breast: Secondary | ICD-10-CM

## 2015-02-05 LAB — HM MAMMOGRAPHY: HM MAMMO: ABNORMAL

## 2015-02-08 ENCOUNTER — Telehealth: Payer: Self-pay | Admitting: *Deleted

## 2015-02-08 NOTE — Telephone Encounter (Signed)
Prior auth. done on-line for estropipate 1.5 mg, will wait for response.

## 2015-02-09 NOTE — Telephone Encounter (Signed)
Medication approved until 02/08/16.

## 2015-02-10 ENCOUNTER — Ambulatory Visit (INDEPENDENT_AMBULATORY_CARE_PROVIDER_SITE_OTHER): Payer: Medicare Other

## 2015-02-10 DIAGNOSIS — E538 Deficiency of other specified B group vitamins: Secondary | ICD-10-CM

## 2015-02-10 MED ORDER — CYANOCOBALAMIN 1000 MCG/ML IJ SOLN
1000.0000 ug | Freq: Once | INTRAMUSCULAR | Status: AC
  Administered 2015-02-10: 1000 ug via INTRAMUSCULAR

## 2015-02-17 ENCOUNTER — Ambulatory Visit
Admission: RE | Admit: 2015-02-17 | Discharge: 2015-02-17 | Disposition: A | Discharge status: Home / Self Care | Payer: Medicare Other | Source: Ambulatory Visit | Attending: Women's Health | Admitting: Women's Health

## 2015-02-17 ENCOUNTER — Other Ambulatory Visit: Payer: Self-pay | Admitting: Women's Health

## 2015-02-17 DIAGNOSIS — R928 Other abnormal and inconclusive findings on diagnostic imaging of breast: Secondary | ICD-10-CM

## 2015-02-19 ENCOUNTER — Telehealth: Payer: Self-pay | Admitting: *Deleted

## 2015-02-19 MED ORDER — ALPRAZOLAM 0.5 MG PO TABS: ORAL_TABLET | ORAL | Status: DC

## 2015-02-19 NOTE — Telephone Encounter (Signed)
We can try xanax- that should help / watch out for sedation / make sure she has a driver Please call in

## 2015-02-19 NOTE — Telephone Encounter (Signed)
Rx called in as prescribed, pt notified Rx sent and advise of Dr. Marliss Coots instructions. Pt's husband will drive her and take her home

## 2015-02-19 NOTE — Telephone Encounter (Signed)
Pt left voicemail at Triage she has to have a breast Biopsy done on Wednesday 02/24/15, [facility] she needs to contact us. Pt is requesting you prescribe a medication to help with her anxiety during that procedure that she can take the morning of because she will be awake during procedure

## 2015-02-23 ENCOUNTER — Other Ambulatory Visit: Payer: Self-pay | Admitting: Women's Health

## 2015-02-23 DIAGNOSIS — R928 Other abnormal and inconclusive findings on diagnostic imaging of breast: Secondary | ICD-10-CM

## 2015-02-24 ENCOUNTER — Ambulatory Visit
Admission: RE | Admit: 2015-02-24 | Discharge: 2015-02-24 | Disposition: A | Discharge status: Home / Self Care | Payer: Medicare Other | Source: Ambulatory Visit | Attending: Women's Health | Admitting: Women's Health

## 2015-02-24 DIAGNOSIS — R928 Other abnormal and inconclusive findings on diagnostic imaging of breast: Secondary | ICD-10-CM

## 2015-02-26 ENCOUNTER — Telehealth: Payer: Self-pay | Admitting: *Deleted

## 2015-02-26 ENCOUNTER — Encounter: Payer: Self-pay | Admitting: *Deleted

## 2015-02-26 DIAGNOSIS — C50211 Malignant neoplasm of upper-inner quadrant of right female breast: Secondary | ICD-10-CM

## 2015-02-26 DIAGNOSIS — Z170 Estrogen receptor positive status [ER+]: Secondary | ICD-10-CM | POA: Insufficient documentation

## 2015-02-26 HISTORY — DX: Malignant neoplasm of upper-inner quadrant of right female breast: C50.211

## 2015-02-26 NOTE — Telephone Encounter (Signed)
Confirmed [facility] for 03/03/15 at 0800.  Instructions and contact information given.

## 2015-03-03 ENCOUNTER — Telehealth: Payer: Self-pay | Admitting: Oncology

## 2015-03-03 ENCOUNTER — Other Ambulatory Visit: Payer: Self-pay | Admitting: Family Medicine

## 2015-03-03 ENCOUNTER — Other Ambulatory Visit (HOSPITAL_BASED_OUTPATIENT_CLINIC_OR_DEPARTMENT_OTHER): Payer: Medicare Other

## 2015-03-03 ENCOUNTER — Other Ambulatory Visit: Payer: Self-pay | Admitting: General Surgery

## 2015-03-03 ENCOUNTER — Encounter: Payer: Self-pay | Admitting: Oncology

## 2015-03-03 ENCOUNTER — Encounter: Payer: Self-pay | Admitting: Skilled Nursing Facility1

## 2015-03-03 ENCOUNTER — Encounter: Payer: Self-pay | Admitting: Nurse Practitioner

## 2015-03-03 ENCOUNTER — Ambulatory Visit (HOSPITAL_BASED_OUTPATIENT_CLINIC_OR_DEPARTMENT_OTHER): Payer: Medicare Other | Admitting: Oncology

## 2015-03-03 ENCOUNTER — Encounter: Payer: Self-pay | Admitting: *Deleted

## 2015-03-03 ENCOUNTER — Ambulatory Visit (HOSPITAL_BASED_OUTPATIENT_CLINIC_OR_DEPARTMENT_OTHER): Payer: Medicare Other

## 2015-03-03 ENCOUNTER — Encounter (INDEPENDENT_AMBULATORY_CARE_PROVIDER_SITE_OTHER): Payer: Self-pay

## 2015-03-03 ENCOUNTER — Ambulatory Visit
Admission: RE | Admit: 2015-03-03 | Discharge: 2015-03-03 | Disposition: A | Discharge status: Home / Self Care | Payer: Medicare Other | Source: Ambulatory Visit | Attending: Radiation Oncology | Admitting: Radiation Oncology

## 2015-03-03 VITALS — BP 147/59 | HR 98 | Temp 97.9°F | Resp 18 | Ht 64.5 in | Wt 208.6 lb

## 2015-03-03 DIAGNOSIS — C50211 Malignant neoplasm of upper-inner quadrant of right female breast: Secondary | ICD-10-CM

## 2015-03-03 DIAGNOSIS — Z170 Estrogen receptor positive status [ER+]: Secondary | ICD-10-CM | POA: Diagnosis not present

## 2015-03-03 LAB — CBC WITH DIFFERENTIAL/PLATELET
BASO%: 1.1 % (ref 0.0–2.0)
Basophils Absolute: 0.1 10*3/uL (ref 0.0–0.1)
EOS ABS: 0.5 10*3/uL (ref 0.0–0.5)
EOS%: 5.3 % (ref 0.0–7.0)
HCT: 37.7 % (ref 34.8–46.6)
HGB: 12.1 g/dL (ref 11.6–15.9)
LYMPH%: 28 % (ref 14.0–49.7)
MCH: 27.3 pg (ref 25.1–34.0)
MCHC: 32.2 g/dL (ref 31.5–36.0)
MCV: 84.8 fL (ref 79.5–101.0)
MONO#: 0.8 10*3/uL (ref 0.1–0.9)
MONO%: 7.6 % (ref 0.0–14.0)
NEUT%: 58 % (ref 38.4–76.8)
NEUTROS ABS: 5.9 10*3/uL (ref 1.5–6.5)
Platelets: 270 10*3/uL (ref 145–400)
RBC: 4.44 10*6/uL (ref 3.70–5.45)
RDW: 16.3 % — ABNORMAL HIGH (ref 11.2–14.5)
WBC: 10.2 10*3/uL (ref 3.9–10.3)
lymph#: 2.9 10*3/uL (ref 0.9–3.3)

## 2015-03-03 LAB — COMPREHENSIVE METABOLIC PANEL (CC13)
ALT: 17 U/L (ref 0–55)
ANION GAP: 11 meq/L (ref 3–11)
AST: 20 U/L (ref 5–34)
Albumin: 3.6 g/dL (ref 3.5–5.0)
Alkaline Phosphatase: 92 U/L (ref 40–150)
BILIRUBIN TOTAL: 0.26 mg/dL (ref 0.20–1.20)
BUN: 17.8 mg/dL (ref 7.0–26.0)
CO2: 24 mEq/L (ref 22–29)
Calcium: 8.8 mg/dL (ref 8.4–10.4)
Chloride: 103 mEq/L (ref 98–109)
Creatinine: 0.9 mg/dL (ref 0.6–1.1)
EGFR: 63 mL/min/{1.73_m2} — ABNORMAL LOW (ref >90)
GLUCOSE: 138 mg/dL (ref 70–140)
POTASSIUM: 3.6 meq/L (ref 3.5–5.1)
Sodium: 138 mEq/L (ref 136–145)
TOTAL PROTEIN: 7.1 g/dL (ref 6.4–8.3)

## 2015-03-03 NOTE — Progress Notes (Signed)
Jennifer Bartlett is a very pleasant 69 y.o. 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  Biopsy results revealed the tumor's prognostic profile is ER positive, PR positive, and HER2/neu negative. Ki67 is 3%.  She presents today with her husband to the [facility]) for treatment consideration and recommendations from the breast surgeon, radiation oncologist, and medical oncologist.     I briefly met with Jennifer Bartlett and her husband during her [facility] visit today. We discussed the purpose of the Survivorship Clinic, which will include monitoring for recurrence, coordinating completion of age and gender-appropriate cancer screenings, promotion of overall wellness, as well as managing potential late/long-term side effects of anti-cancer treatments.    The treatment plan for Jennifer Bartlett will likely include surgery, radiation therapy, and anti-estrogen therapy.  As of today, the intent of treatment for Jennifer Bartlett is cure, therefore she will be eligible for the Survivorship Clinic upon her completion of treatment.  Her survivorship care plan (SCP) document will be drafted and updated throughout the course of her treatment trajectory. She will receive the SCP in an office visit with myself in the Survivorship Clinic once she has completed treatment.   Jennifer Bartlett was encouraged to ask questions and all questions were answered to her satisfaction.  She was given my business card and encouraged to contact me with any concerns regarding survivorship.  I look forward to participating in her care.   Kenn File, [place] [phone]

## 2015-03-03 NOTE — Telephone Encounter (Signed)
Electronic refill request, refilled on 02/19/15 #5 with 0 refills, please advise

## 2015-03-03 NOTE — Progress Notes (Signed)
Jennifer Bartlett  [phone] [phone]     ID: Jennifer Bartlett DOB: September 29, 1945  MR#: [id number]  KGY#:[id number]  Patient Care Team: Abner Greenspan, MD as PCP - General Huel Cote, NP as Nurse Practitioner (Obstetrics and Gynecology) Stark Klein, MD as Consulting Physician (General Surgery) Chauncey Cruel, MD as Consulting Physician (Oncology) Arloa Koh, MD as Consulting Physician (Radiation Oncology) Mauro Kaufmann, RN as Registered Nurse Rockwell Germany, RN as Registered Nurse PCP: Loura Pardon, MD GYN: SU:  OTHER MD:  CHIEF COMPLAINT: Estrogen receptor positive breast cancer  CURRENT TREATMENT: Awaiting definitive surgery   BREAST CANCER HISTORY: Jewelene underwent bilateral screening mammography with [facility] July 2016. This showed a possible mass in the right breast. She was recalled for diagnostic right mammography with tomosynthesis and right breast ultrasonography 02/17/2015. This found the breast density to be category C. In the right breast upper inner quadrant there was a persistent poorly marginated nodule which was not palpable. By ultrasound this measured 0.5 cm. There was no evidence of right axillary lymphadenopathy.  Biopsy of this mass 02/24/2015 showed ([id number]) an invasive lobular breast cancer, E-cadherin negative, grade 1, estrogen receptor 95% positive, progesterone receptor 60% positive, both with strong staining intensity, with an MIB-1 of 3%, and no HER-2 amplification, the signals ratio being 1.29 and the number per cell 2.00.  Her subsequent history is as detailed below  INTERVAL HISTORY: Patient was evaluated in the multidisciplinary breast cancer clinic 03/03/2015 accompanied by her husband Jennifer Bartlett. Her case was also presented in the multidisciplinary breast cancer conference that same morning. At that time a preliminary plan was proposed: No MRI despite this being a lobular, because of the  small size; breast conserving surgery with sentinel lymph node sampling, to be followed by adjuvant radiation, and assuming the tumor is a small as it appears no Oncotype, but proceeding to antiestrogen therapy.  REVIEW OF SYSTEMS: There were no specific symptoms leading to the original mammogram, which was routinely scheduled. The patient denies unusual headaches, visual changes, nausea, vomiting, stiff neck, dizziness, or gait imbalance. There has been no cough, phlegm production, or pleurisy, no chest pain or pressure, and no change in bowel or bladder habits. The patient denies fever, rash, bleeding, unexplained fatigue or unexplained weight loss. The patient does complain of what she interprets as "low sugar episodes", when she has not eaten for some time she feels lightheaded and sweaty. She tells me this has been going on for over a year. Recall she underwent gastric bypass surgery approximately 18 years ago. A detailed review of systems was otherwise entirely negative.  PAST MEDICAL HISTORY: Past Medical History  Diagnosis Date  . GERD (gastroesophageal reflux disease)   . Hyperlipidemia   . Obesity   . Vitamin B 12 deficiency   . Hypoglycemia   . Hypertension   . Anemia   . Breast cancer of upper-inner quadrant of right female breast 02/26/2015    PAST SURGICAL HISTORY: Past Surgical History  Procedure Laterality Date  . Partial hysterectomy  1990's    bleeding  . Knee surgery      left  . Gastric bypass  08/2003  . Hernia repair    . Abdominal hysterectomy      FAMILY HISTORY Family History  Problem Relation Age of Onset  . Heart disease Mother     MI  . Hypertension Mother   . Alcohol abuse Father   . Heart disease  Father     smoker  . Hypertension Father   . Breast cancer Paternal Grandmother   . Diabetes Maternal Aunt    the patient's father died at the age of 16 and her mother at the age of 38, both from heart attacks in the setting of tobacco abuse. The patient  has 3 brothers, one sister. The patient's paternal grandmother may have been diagnosed with breast cancer in her late 56s, but that is not certain. There is no other history of breast or ovarian cancer in the family to the patient's knowledge.  GYNECOLOGIC HISTORY:  Patient's last menstrual period was 06/08/1989. Menarche age 24, first live birth age 31; the patient is [place] P2. She underwent hysterectomy without salpingo-oophorectomy more than 20 years ago. She has been on hormone replacement more than 20 years. She understands this does increase the risk of developing breast cancer. This was stopped July 2016.  SOCIAL HISTORY:  Glen used to work for [employer] but is now retired. Her husband Gwendolin Briel (goes by Jennifer Bartlett) worked 97 years for [employer]. The patient's daughter Jennifer Bartlett lives in [place] and is Mudlogger of the afterschool program in [place]. The patient's son Jennifer Bartlett lives in [place] and works for [employer], a company that cleans up after disaster or certain crimes. The patient also has an adopted daughter with knee Jennifer Bartlett who lives in [place] and is a homemaker. The patient has 2 blood grandchildren and 2 adopted grandchildren. At home its the patient and her husband, her daughter and her husband and 2 grandchildren. Indea attends a local [employer] DIRECTIVES: Not in place   HEALTH MAINTENANCE: History  Substance Use Topics  . Smoking status: Never Smoker   . Smokeless tobacco: Never Used  . Alcohol Use: No     Colonoscopy: Never (status post bypass surgery)  PAP: 2015  Bone density: c. 2012; normal  Lipid panel:  Allergies  Allergen Reactions  . Crestor [Rosuvastatin Calcium] Other (See Comments)    Myalgia   . Lipitor [Atorvastatin Calcium] Other (See Comments)    Aches and pains  . Penicillins Shortness Of Breath and Swelling  . Zocor [Simvastatin - High Dose] Other (See Comments)    Muscle/joint aches     Current Outpatient Prescriptions  Medication Sig Dispense Refill  . ALPRAZolam (XANAX) 0.5 MG tablet Take 1 pill one hour before procedure 5 tablet 0  . BIOTIN PO Take 1 tablet by mouth daily.    Marland Kitchen CALCIUM PO Take by mouth daily.      . cholecalciferol (VITAMIN D) 400 UNITS TABS Take 400 Units by mouth daily.     . clindamycin (CLEOCIN) 300 MG capsule Take 1 capsule (300 mg total) by mouth 3 (three) times daily. 30 capsule 0  . cyanocobalamin (,VITAMIN B-12,) 1000 MCG/ML injection Inject 1,000 mcg into the muscle once. Every 2 months     . ergocalciferol (VITAMIN D2) 50000 UNITS capsule Take 50,000 Units by mouth once a week. Times 10 weeks.     . hydrochlorothiazide (HYDRODIURIL) 25 MG tablet Take 1 tablet (25 mg total) by mouth daily. 90 tablet 3  . Multiple Vitamin (MULTIVITAMIN) capsule Take 1 capsule by mouth daily.      Marland Kitchen omeprazole (PRILOSEC) 20 MG capsule Take 1 capsule (20 mg total) by mouth daily. 90 capsule 3  . vitamin E 800 UNIT capsule Take 800 Units by mouth daily.      Marland Kitchen zinc gluconate 50 MG tablet  Take 50 mg by mouth daily.      Marland Kitchen estropipate (OGEN) 1.5 MG tablet Take 1 tablet (1.5 mg total) by mouth daily. (Patient not taking: Reported on 03/03/2015) 30 tablet 12   No current facility-administered medications for this visit.    OBJECTIVE: Middle-aged white woman in no acute distress  Filed Vitals:   03/03/15 0812  BP: 147/59  Pulse: 98  Temp: 97.9 F (36.6 C)  Resp: 18     Body mass index is 35.27 kg/(m^2).    ECOG FS:0 - Asymptomatic  Ocular: Sclerae unicteric, pupils equal, round and reactive to light Ear-nose-throat: Oropharynx clear and moist; full upper and lower plates Lymphatic: No cervical or supraclavicular adenopathy Lungs no rales or rhonchi, good excursion bilaterally Heart regular rate and rhythm, no murmur appreciated Abd soft, obese, nontender, positive bowel sounds MSK no focal spinal tenderness, no upper extremity edema Neuro: non-focal,  well-oriented, appropriate affect Breasts: The right breast is status post recent biopsy. There are no skin or nipple changes of concern. There are no palpable masses. The right axilla is benign. The left breast is unremarkable   LAB RESULTS:  CMP     Component Value Date/Time   NA 138 03/03/2015 0756   NA 138 08/17/2014 0838   K 3.6 03/03/2015 0756   K 3.8 08/17/2014 0838   CL 101 08/17/2014 0838   CO2 24 03/03/2015 0756   CO2 29 08/17/2014 0838   GLUCOSE 138 03/03/2015 0756   GLUCOSE 114* 08/17/2014 0838   BUN 17.8 03/03/2015 0756   BUN 17 08/17/2014 0838   CREATININE 0.9 03/03/2015 0756   CREATININE 0.82 08/17/2014 0838   CALCIUM 8.8 03/03/2015 0756   CALCIUM 8.8 08/17/2014 0838   PROT 7.1 03/03/2015 0756   PROT 7.0 08/17/2014 0838   ALBUMIN 3.6 03/03/2015 0756   ALBUMIN 3.6 08/17/2014 0838   AST 20 03/03/2015 0756   AST 16 08/17/2014 0838   ALT 17 03/03/2015 0756   ALT 10 08/17/2014 0838   ALKPHOS 92 03/03/2015 0756   ALKPHOS 105 08/17/2014 0838   BILITOT 0.26 03/03/2015 0756   BILITOT 0.3 08/17/2014 0838   GFRNONAA 99.73 12/07/2009 0911   GFRAA 94 05/22/2008 0847    INo results found for: SPEP, UPEP  Lab Results  Component Value Date   WBC 10.2 03/03/2015   NEUTROABS 5.9 03/03/2015   HGB 12.1 03/03/2015   HCT 37.7 03/03/2015   MCV 84.8 03/03/2015   PLT 270 03/03/2015      Chemistry      Component Value Date/Time   NA 138 03/03/2015 0756   NA 138 08/17/2014 0838   K 3.6 03/03/2015 0756   K 3.8 08/17/2014 0838   CL 101 08/17/2014 0838   CO2 24 03/03/2015 0756   CO2 29 08/17/2014 0838   BUN 17.8 03/03/2015 0756   BUN 17 08/17/2014 0838   CREATININE 0.9 03/03/2015 0756   CREATININE 0.82 08/17/2014 0838      Component Value Date/Time   CALCIUM 8.8 03/03/2015 0756   CALCIUM 8.8 08/17/2014 0838   ALKPHOS 92 03/03/2015 0756   ALKPHOS 105 08/17/2014 0838   AST 20 03/03/2015 0756   AST 16 08/17/2014 0838   ALT 17 03/03/2015 0756   ALT 10  08/17/2014 0838   BILITOT 0.26 03/03/2015 0756   BILITOT 0.3 08/17/2014 0838       No results found for: LABCA2  No components found for: [id number]  No results for input(s): INR in the last  168 hours.  Urinalysis No results found for: COLORURINE, APPEARANCEUR, LABSPEC, PHURINE, GLUCOSEU, HGBUR, BILIRUBINUR, KETONESUR, PROTEINUR, UROBILINOGEN, NITRITE, LEUKOCYTESUR  STUDIES: Mm Digital Diagnostic Unilat R  02/24/2015   CLINICAL DATA:  Status post ultrasound-guided core needle biopsy of right breast 1 o'clock nodule.  EXAM: DIAGNOSTIC RIGHT MAMMOGRAM POST ULTRASOUND BIOPSY  COMPARISON:  Previous exam(s).  FINDINGS: Mammographic images were obtained following ultrasound guided biopsy of right breast 1 o'clock, posterior depth, nodule. LM and CC projection images demonstrate presence of ribbon shaped marker within the biopsy site at 1 o'clock, far posterior depth.  IMPRESSION: Successful post biopsy metallic marker placement.  Final Assessment: Post Procedure Mammograms for Marker Placement   Electronically Signed   By: Fidela Salisbury M.D.   On: 02/24/2015 09:12   US Breast Ltd Uni Right Inc Axilla  02/17/2015   CLINICAL DATA:  Right breast upper inner quadrant focal asymmetry seen on most recent screening mammogram.  EXAM: DIGITAL DIAGNOSTIC RIGHT MAMMOGRAM WITH 3D TOMOSYNTHESIS WITH CAD  ULTRASOUND RIGHT BREAST  COMPARISON:  Previous exam(s).  ACR Breast Density Category c: The breast tissue is heterogeneously dense, which may obscure small masses.  FINDINGS: There is a persistent ill marginated few mm nodule in the right breast upper inner quadrant, far posterior depth.  Mammographic images were processed with CAD.  On physical exam, no suspicious masses are found.  Targeted ultrasound is performed, showing right breast 1 o'clock 10 cm from the nipple irregular taller than wide 0.5 x 0.5 x 0.4 cm nodule with associated dense posterior acoustic shadowing. No evidence of right axillary  lymphadenopathy.  IMPRESSION: Right 1 o'clock small but suspicious nodule, for which ultrasound-guided core needle biopsy is recommended.  RECOMMENDATION: Right breast ultrasound-guided core needle biopsy.  I have discussed the findings and recommendations with the patient. Results were also provided in writing at the conclusion of the visit. If applicable, a reminder letter will be sent to the patient regarding the next appointment.  BI-RADS CATEGORY  4: Suspicious.   Electronically Signed   By: Fidela Salisbury M.D.   On: 02/17/2015 10:34   Mm Diag Breast Tomo Uni Right  02/17/2015   CLINICAL DATA:  Right breast upper inner quadrant focal asymmetry seen on most recent screening mammogram.  EXAM: DIGITAL DIAGNOSTIC RIGHT MAMMOGRAM WITH 3D TOMOSYNTHESIS WITH CAD  ULTRASOUND RIGHT BREAST  COMPARISON:  Previous exam(s).  ACR Breast Density Category c: The breast tissue is heterogeneously dense, which may obscure small masses.  FINDINGS: There is a persistent ill marginated few mm nodule in the right breast upper inner quadrant, far posterior depth.  Mammographic images were processed with CAD.  On physical exam, no suspicious masses are found.  Targeted ultrasound is performed, showing right breast 1 o'clock 10 cm from the nipple irregular taller than wide 0.5 x 0.5 x 0.4 cm nodule with associated dense posterior acoustic shadowing. No evidence of right axillary lymphadenopathy.  IMPRESSION: Right 1 o'clock small but suspicious nodule, for which ultrasound-guided core needle biopsy is recommended.  RECOMMENDATION: Right breast ultrasound-guided core needle biopsy.  I have discussed the findings and recommendations with the patient. Results were also provided in writing at the conclusion of the visit. If applicable, a reminder letter will be sent to the patient regarding the next appointment.  BI-RADS CATEGORY  4: Suspicious.   Electronically Signed   By: Fidela Salisbury M.D.   On: 02/17/2015 10:34   Mm  Screening Breast Tomo Bilateral  02/05/2015   CLINICAL DATA:  Screening.  EXAM: DIGITAL SCREENING BILATERAL MAMMOGRAM WITH 3D TOMO WITH CAD  COMPARISON:  Previous exam(s).  ACR Breast Density Category b: There are scattered areas of fibroglandular density.  FINDINGS: In the right breast, a possible mass warrants further evaluation. In the left breast, no findings suspicious for malignancy. Images were processed with CAD.  IMPRESSION: Further evaluation is suggested for possible mass in the right breast.  RECOMMENDATION: Diagnostic mammogram and possibly ultrasound of the right breast. (Code:[id number])  The patient will be contacted regarding the findings, and additional imaging will be scheduled.  BI-RADS CATEGORY  0: Incomplete. Need additional imaging evaluation and/or prior mammograms for comparison.   Electronically Signed   By: Abelardo Diesel M.D.   On: 02/05/2015 10:27   [place] Rt Breast Bx W Loc Dev 1st Lesion Img Bx Spec US Guide  02/26/2015   ADDENDUM REPORT: 02/25/2015 11:33  ADDENDUM: Pathology revealed grade I invasive mammary carcinoma in the right breast. This was found to be concordant by Dr. Fidela Salisbury. Pathology was discussed with the patient by telephone. She reported doing well after the biopsy with tenderness at the site. Post biopsy instructions and care were reviewed and her questions were answered. She has been [facility] at [facility] on March 03, 2015. The patient is encouraged to come to [facility] for educational materials. My number was provided for additional concerns and questions.  Pathology results reported by Susa Raring RN, BSN on February 25, 2015.   Electronically Signed   By: Fidela Salisbury M.D.   On: 02/25/2015 11:33   02/26/2015   CLINICAL DATA:  Right breast 1 o'clock few mm suspicious nodule.  EXAM: ULTRASOUND GUIDED RIGHT BREAST CORE NEEDLE BIOPSY  COMPARISON:  Previous exam(s).  FINDINGS: I met with  the patient and we discussed the procedure of ultrasound-guided biopsy, including benefits and alternatives. We discussed the high likelihood of a successful procedure. We discussed the risks of the procedure, including infection, bleeding, tissue injury, clip migration, and inadequate sampling. Informed written consent was given. The usual time-out protocol was performed immediately prior to the procedure.  Using sterile technique and 1% Lidocaine as local anesthetic, under direct ultrasound visualization, a 14 gauge spring-loaded device was used to perform biopsy of right breast 1 o'clock nodule using a medial approach. At the conclusion of the procedure a metallic tissue marker clip was deployed into the biopsy cavity. Follow up 2 view mammogram was performed and dictated separately.  IMPRESSION: Ultrasound guided biopsy of right breast 1 o'clock nodule. No apparent complications.  Electronically Signed: By: Fidela Salisbury M.D. On: 02/24/2015 08:46    ASSESSMENT: 69 y.o. [place], [place] woman status post right breast biopsy 02/24/2015 for a clinical T1a N0, stage IA invasive lobular carcinoma, grade 1, estrogen and progesterone receptor strongly positive, HER-2 not amplified, with an MIB-1 of 3%  (1) breast conserving surgery with sentinel lymph node sampling pending  (2) adjuvant radiation to follow surgery  (3) no Oncotype planned, instead adjuvant anti-estrogens to follow radiation  PLAN: We spent the better part of today's hour-long appointment discussing the biology of breast cancer in general, and the specifics of the patient's tumor in particular. Maddelynn understands that lobular breast cancers are only 1 out of 10 of all breast cancers and that they tend to not take together they way the more common ductal breast cancers do. For this reason it is not uncommon for margins to be positive after surgery. We generally obtain MRIs  in lobular breast cancers for that reason it however the patient's  breast cancer appears so small on imaging and her breast her sufficiently generous that we felt the MRI could be bypassed. This was discussed with the patient today.  She understands her cancer is slow growing and nonaggressive looking. Chemotherapy generally is not effective in cancers with those characteristics and we do have some data that chemotherapy is also less effective in lobular breast cancers then in Doxil breast cancers. Given the tumor's size and the prognostic profile I do not feel an Oncotype is necessary to decide that chemotherapy would be of marginal or no benefit in this case. Accordingly an Oncotype is not planned, even though that would be the standard guidelines through [facility] if the cancer is larger than 5 mm (which this one might not be).  The plan accordingly will be for surgery with sentinel lymph node sampling to be followed by adjuvant radiation. The patient will then see me and we will discuss anti-estrogens, most likely anastrozole.  I also discussed the patient's "low blood sugar" episodes are with her. It could be that this is nothing to do with her blood sugar and might in fact be cardiac. The patient's husband has a history of diabetes and knows how to do a blood sugar checked so the next time the patient has these symptoms he will make sure to check her blood sugar and he will let her primary care doctor know the results. If the blood sugar is not low and possibly a cardiac evaluation might be warranted.  The patient also has never had a colonoscopy. She could consider a capsule colonoscopy, with the understanding that if there is any finding it might be difficult to proceed to biopsy.  The patient has a good understanding of the overall plan. She agrees with it. She knows the goal of treatment in her case is cure. She will call with any problems that may develop before her next visit here.  Chauncey Cruel, MD   03/03/2015 9:16 AM Medical Oncology and Hematology [facility] [address] [place], [place] [address] Tel. [phone]    Fax. [phone]

## 2015-03-03 NOTE — Progress Notes (Signed)
Checked in new patient with no issues prior to seeing the dr. She said she was called and all taken over the phone..so no packet given.I advised her of alight fund if treatment and issued and will call if issues. She has lenise's card, but will be Shauna's patient.

## 2015-03-03 NOTE — Progress Notes (Signed)
Subjective:     Patient ID: Jennifer Bartlett, female   DOB: Mar 12, 1946, 69 y.o.   MRN: [id number]  HPI   Review of Systems     Objective:   Physical Exam For the patient to understand and be given the tools to implement a healthy plant based diet during their cancer diagnosis.    Assessment:     Patient was seen today and found to be in good spirits and accompanied by her seemingly supportive husband. Pt states her children and their kids are living with her so life is very chaotic and crazy. Pt states she was taking different vitamins because she thought she needed to. Pts current/relavant medications: vitamin E, hydrochlorothiazide, omeprezole, Calcium, b12, vitamin D, and biotin, and multivitamin. PTs GFR 63. Pts ht 5'4'' BMI 35.2, and 208 pounds. Pt has had gastric bypass.      Plan:     Dietitian educated the patient on implementing a plant based diet by incorporating more plant proteins, fruits, and vegetables. As a part of a healthy routine physical activity was discussed. Dietitian advised she and her husband make a date for alone time every week and use the gym as her destressing time every day. Dietitian explained supplementation and the USP label. The importance of legitimate, evidence based information was discussed and examples were given. A folder of evidence based information with a focus on a plant based diet and general nutrition during cancer was given to the patient.  As a part of the continuum of care the cancer dietitian's contact information was given to the patient in the event they would like to have a follow up appointment.

## 2015-03-03 NOTE — Telephone Encounter (Signed)
Pt left v/m; pt having CA surgery on 03/09/15; on 03/08/15 pt is to have a procedure done to place a wire prior to surgery. Pt request at least 2 more alprazolam for procedure on 03/08/15. Pt request cb.

## 2015-03-03 NOTE — Progress Notes (Signed)
Clinical Social Work [place] Psychosocial Distress Screening [place]  Patient completed distress screening protocol and scored a 7 on the Psychosocial Distress Thermometer which indicates moderate distress. Clinical Social Worker met with patient and patients husband in [facility] to assess for distress and other psychosocial needs. Patient stated that although she was feeling overwhelmed she felt "better" after meeting with the treatment team and getting information on her treatment plan. CSW and patient discussed common feeling and emotions when being diagnosed with cancer, and the importance of support during treatment. CSW informed patient of the support team and support services at [facility], and patient was agreeable to an alight guide referral. CSW provided contact information and encouraged patient to call with any questions or concerns.   ONCBCN DISTRESS SCREENING 03/03/2015  Screening Type Initial Screening  Distress experienced in past week (1-10) 7  Physical Problem type Sleep/insomnia  Physician notified of physical symptoms Yes  Referral to clinical psychology No  Referral to clinical social work Yes  Referral to dietition No  Referral to financial advocate No  Referral to support programs Yes  Referral to palliative care No   Johnnye Lana, MSW, LCSW, OSW-C Clinical Social Worker [place] [phone]

## 2015-03-03 NOTE — Telephone Encounter (Signed)
Px written for call in   Use with caution as needed  Good luck with everything

## 2015-03-03 NOTE — Progress Notes (Signed)
[facility] NEW PATIENT EVALUATION  Name: Jennifer Bartlett MRN: [id number]  Date:   03/03/2015           DOB: 1945/08/20  Status: outpatient   CC: Loura Pardon, MD  Stark Klein, MD    REFERRING PHYSICIAN: Stark Klein, MD   DIAGNOSIS: Clinical stage IA (T1a N0 M0) invasive lobular carcinoma of the right breast   HISTORY OF PRESENT ILLNESS:  Jennifer Bartlett is a 69 y.o. female who is seen today through the courtesy of Dr. Barry Dienes at the breast multidisciplinary clinic for evaluation of her T1a N0 invasive lobular carcinoma the right breast.  At the time of a screening mammogram on 02/05/2015 at the [facility] she was found have a possible mass within the right breast.  Additional diagnostic views showed a mass deep within the upper inner quadrant of the right breast which on ultrasound measured 0.5 x 0.5 x 0.4 cm at 1:00 him a 10 cm from the nipple.  The axilla was benign on ultrasound.  A needle core biopsy on 02/24/2015 was diagnostic for invasive lobular carcinoma, grade 1 and this was confirmed by Ecadherin staining which was negative.  The carcinoma was ER/PR positive, HER-2/neu negative with a Ki-67 of 3%.  She is without complaints today.  She is seen today with Dr. Barry Dienes and Dr. Jana Hakim.  PREVIOUS RADIATION THERAPY: No   PAST MEDICAL HISTORY:  has a past medical history of GERD (gastroesophageal reflux disease); Hyperlipidemia; Obesity; Vitamin B 12 deficiency; Hypoglycemia; Hypertension; Anemia; Breast cancer of upper-inner quadrant of right female breast (02/26/2015); and Hot flashes.     PAST SURGICAL HISTORY:  Past Surgical History  Procedure Laterality Date  . Partial hysterectomy  1990's    bleeding  . Knee surgery      left  . Gastric bypass  08/2003  . Hernia repair    . Abdominal hysterectomy    . Tubal ligation       FAMILY HISTORY: family history includes Alcohol abuse in her father; Breast cancer in her paternal  grandmother; Diabetes in her maternal aunt; Heart disease in her father and mother; Hypertension in her father and mother.   A paternal grandmother may have had breast cancer in her 29s.       SOCIAL HISTORY:  reports that she has never smoked. She has never used smokeless tobacco. She reports that she does not drink alcohol or use illicit drugs.  Married, 2 children, one adopted child.  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      ALLERGIES: Crestor; Lipitor; Penicillins; and Zocor   MEDICATIONS:  Current Outpatient Prescriptions  Medication Sig Dispense Refill  . ALPRAZolam (XANAX) 0.5 MG tablet Take 1 pill one hour before procedure 5 tablet 0  . BIOTIN PO Take 1 tablet by mouth daily.    Marland Kitchen CALCIUM PO Take by mouth daily.      . cholecalciferol (VITAMIN D) 400 UNITS TABS Take 400 Units by mouth daily.     . clindamycin (CLEOCIN) 300 MG capsule Take 1 capsule (300 mg total) by mouth 3 (three) times daily. 30 capsule 0  . cyanocobalamin (,VITAMIN B-12,) 1000 MCG/ML injection Inject 1,000 mcg into the muscle once. Every 2 months     . ergocalciferol (VITAMIN D2) 50000 UNITS capsule Take 50,000 Units by mouth once a week. Times 10 weeks.     Marland Kitchen estropipate (OGEN) 1.5 MG tablet Take 1 tablet (1.5  mg total) by mouth daily. (Patient not taking: Reported on 03/03/2015) 30 tablet 12  . hydrochlorothiazide (HYDRODIURIL) 25 MG tablet Take 1 tablet (25 mg total) by mouth daily. 90 tablet 3  . Multiple Vitamin (MULTIVITAMIN) capsule Take 1 capsule by mouth daily.      Marland Kitchen omeprazole (PRILOSEC) 20 MG capsule Take 1 capsule (20 mg total) by mouth daily. 90 capsule 3  . vitamin E 800 UNIT capsule Take 800 Units by mouth daily.      Marland Kitchen zinc gluconate 50 MG tablet Take 50 mg by mouth daily.       No current facility-administered medications for this encounter.     REVIEW OF SYSTEMS:  Pertinent items are noted in HPI.    PHYSICAL EXAM: alert and oriented  69 year old white female appearing her stated age.   Wt Readings from Last 3 Encounters:  03/03/15 208 lb 9.6 oz (94.62 kg)  08/17/14 211 lb (95.709 kg)  02/18/14 213 lb 9.6 oz (96.888 kg)   Temp Readings from Last 3 Encounters:  03/03/15 97.9 F (36.6 C) Oral  08/17/14 98.2 F (36.8 C) Oral  08/25/13 98.1 F (36.7 C) Oral   BP Readings from Last 3 Encounters:  03/03/15 147/59  08/17/14 138/70  02/18/14 119/76   Pulse Readings from Last 3 Encounters:  03/03/15 98  08/17/14 68  08/25/13 70   Head and neck examination: Grossly unremarkable.  Nodes: Without palpable cervical, supraclavicular, or axillary lymphadenopathy.  Breasts: There is a punctate biopsy wound along the upper inner quadrant of the right breast.  No masses are appreciated.  Left breast without masses or lesions.  Extremities: Without edema.     LABORATORY DATA:  Lab Results  Component Value Date   WBC 10.2 03/03/2015   HGB 12.1 03/03/2015   HCT 37.7 03/03/2015   MCV 84.8 03/03/2015   PLT 270 03/03/2015   Lab Results  Component Value Date   NA 138 03/03/2015   K 3.6 03/03/2015   CL 101 08/17/2014   CO2 24 03/03/2015   Lab Results  Component Value Date   ALT 17 03/03/2015   AST 20 03/03/2015   ALKPHOS 92 03/03/2015   BILITOT 0.26 03/03/2015      IMPRESSION: Stage I A (T1a N0 M0) invasive lobular carcinoma of the right breast.  I explained to the patient and her husband that her local management options include mastectomy or partial mastectomy along with a sentinel lymph node biopsy.  She desires breast preservation.  We discussed the potential acute and late toxicities of radiation therapy, and she is interested in breast conservation.  She may be a candidate for hypofractionated treatment depending on her anatomy the time of her simulation.  She would also be a candidate for adjuvant antiestrogen therapy.   PLAN:As discussed above.    I spent 30 minutes face to face with the patient and more  than 50% of that time was spent in counseling and/or coordination of care.

## 2015-03-03 NOTE — Telephone Encounter (Signed)
Appointments made and avs printed for patient °

## 2015-03-04 ENCOUNTER — Telehealth: Payer: Self-pay | Admitting: Oncology

## 2015-03-04 ENCOUNTER — Encounter (HOSPITAL_BASED_OUTPATIENT_CLINIC_OR_DEPARTMENT_OTHER): Payer: Self-pay | Admitting: *Deleted

## 2015-03-04 NOTE — Telephone Encounter (Signed)
Prescription called to pharmacy. Patient notified.

## 2015-03-04 NOTE — Progress Notes (Signed)
Bring all medications. Pt is coming Monday at 10 am for EKG, unable to come before since she is going out of town today for the weekend.

## 2015-03-04 NOTE — Telephone Encounter (Signed)
Spoke with patient today and she is aware of her follow up as she did not stop by scheduling 03/03/15

## 2015-03-08 ENCOUNTER — Telehealth: Payer: Self-pay | Admitting: *Deleted

## 2015-03-08 ENCOUNTER — Ambulatory Visit
Admission: RE | Admit: 2015-03-08 | Discharge: 2015-03-08 | Disposition: A | Discharge status: Home / Self Care | Payer: Medicare Other | Source: Ambulatory Visit | Attending: General Surgery | Admitting: General Surgery

## 2015-03-08 ENCOUNTER — Encounter (HOSPITAL_BASED_OUTPATIENT_CLINIC_OR_DEPARTMENT_OTHER)
Admission: RE | Admit: 2015-03-08 | Discharge: 2015-03-08 | Disposition: A | Discharge status: Home / Self Care | Payer: Medicare Other | Source: Ambulatory Visit | Attending: General Surgery | Admitting: General Surgery

## 2015-03-08 DIAGNOSIS — C50211 Malignant neoplasm of upper-inner quadrant of right female breast: Secondary | ICD-10-CM

## 2015-03-08 DIAGNOSIS — K219 Gastro-esophageal reflux disease without esophagitis: Secondary | ICD-10-CM | POA: Diagnosis not present

## 2015-03-08 DIAGNOSIS — F419 Anxiety disorder, unspecified: Secondary | ICD-10-CM | POA: Diagnosis not present

## 2015-03-08 DIAGNOSIS — E780 Pure hypercholesterolemia: Secondary | ICD-10-CM | POA: Diagnosis not present

## 2015-03-08 DIAGNOSIS — I10 Essential (primary) hypertension: Secondary | ICD-10-CM | POA: Diagnosis not present

## 2015-03-08 DIAGNOSIS — I4891 Unspecified atrial fibrillation: Secondary | ICD-10-CM | POA: Diagnosis not present

## 2015-03-08 DIAGNOSIS — C50911 Malignant neoplasm of unspecified site of right female breast: Secondary | ICD-10-CM | POA: Diagnosis present

## 2015-03-08 NOTE — Telephone Encounter (Signed)
Left message for a return phone call from [facility] 8/3.  Awaiting patient response.

## 2015-03-09 ENCOUNTER — Encounter (HOSPITAL_BASED_OUTPATIENT_CLINIC_OR_DEPARTMENT_OTHER): Payer: Self-pay | Admitting: Anesthesiology

## 2015-03-09 ENCOUNTER — Ambulatory Visit (HOSPITAL_BASED_OUTPATIENT_CLINIC_OR_DEPARTMENT_OTHER): Payer: Medicare Other | Admitting: Anesthesiology

## 2015-03-09 ENCOUNTER — Ambulatory Visit
Admission: RE | Admit: 2015-03-09 | Discharge: 2015-03-09 | Disposition: A | Discharge status: Home / Self Care | Payer: Medicare Other | Source: Ambulatory Visit | Attending: General Surgery | Admitting: General Surgery

## 2015-03-09 ENCOUNTER — Encounter: Payer: Self-pay | Admitting: Family Medicine

## 2015-03-09 ENCOUNTER — Encounter (HOSPITAL_COMMUNITY)
Admission: RE | Admit: 2015-03-09 | Discharge: 2015-03-09 | Disposition: A | Discharge status: Home / Self Care | Payer: Medicare Other | Source: Ambulatory Visit | Attending: General Surgery | Admitting: General Surgery

## 2015-03-09 ENCOUNTER — Ambulatory Visit (HOSPITAL_BASED_OUTPATIENT_CLINIC_OR_DEPARTMENT_OTHER)
Admission: RE | Admit: 2015-03-09 | Discharge: 2015-03-09 | Disposition: A | Discharge status: Home / Self Care | Payer: Medicare Other | Source: Ambulatory Visit | Attending: General Surgery | Admitting: General Surgery

## 2015-03-09 ENCOUNTER — Encounter (HOSPITAL_BASED_OUTPATIENT_CLINIC_OR_DEPARTMENT_OTHER)
Admission: RE | Disposition: A | Discharge status: Home / Self Care | Payer: Self-pay | Source: Ambulatory Visit | Attending: General Surgery

## 2015-03-09 DIAGNOSIS — C50211 Malignant neoplasm of upper-inner quadrant of right female breast: Secondary | ICD-10-CM | POA: Diagnosis not present

## 2015-03-09 DIAGNOSIS — F419 Anxiety disorder, unspecified: Secondary | ICD-10-CM | POA: Diagnosis not present

## 2015-03-09 DIAGNOSIS — I4891 Unspecified atrial fibrillation: Secondary | ICD-10-CM | POA: Insufficient documentation

## 2015-03-09 DIAGNOSIS — K219 Gastro-esophageal reflux disease without esophagitis: Secondary | ICD-10-CM | POA: Diagnosis not present

## 2015-03-09 DIAGNOSIS — E780 Pure hypercholesterolemia: Secondary | ICD-10-CM | POA: Insufficient documentation

## 2015-03-09 DIAGNOSIS — I10 Essential (primary) hypertension: Secondary | ICD-10-CM | POA: Insufficient documentation

## 2015-03-09 HISTORY — DX: Anxiety disorder, unspecified: F41.9

## 2015-03-09 HISTORY — PX: RADIOACTIVE SEED GUIDED PARTIAL MASTECTOMY WITH AXILLARY SENTINEL LYMPH NODE BIOPSY: SHX6520

## 2015-03-09 SURGERY — RADIOACTIVE SEED GUIDED PARTIAL MASTECTOMY WITH AXILLARY SENTINEL LYMPH NODE BIOPSY
Anesthesia: Regional | Site: Breast | Laterality: Right

## 2015-03-09 MED ORDER — BUPIVACAINE HCL (PF) 0.25 % IJ SOLN
INTRAMUSCULAR | Status: AC
  Filled 2015-03-09: qty 30

## 2015-03-09 MED ORDER — TECHNETIUM TC 99M SULFUR COLLOID FILTERED
1.0000 | Freq: Once | INTRAVENOUS | Status: AC | PRN
  Administered 2015-03-09: 1 via INTRADERMAL

## 2015-03-09 MED ORDER — PROPOFOL 10 MG/ML IV BOLUS
INTRAVENOUS | Status: DC | PRN
  Administered 2015-03-09: 150 mg via INTRAVENOUS
  Administered 2015-03-09: 30 mg via INTRAVENOUS

## 2015-03-09 MED ORDER — LIDOCAINE HCL (CARDIAC) 20 MG/ML IV SOLN
INTRAVENOUS | Status: DC | PRN
  Administered 2015-03-09: 50 mg via INTRAVENOUS

## 2015-03-09 MED ORDER — MIDAZOLAM HCL 2 MG/2ML IJ SOLN
INTRAMUSCULAR | Status: AC
  Filled 2015-03-09: qty 2

## 2015-03-09 MED ORDER — PROPOFOL 10 MG/ML IV BOLUS
INTRAVENOUS | Status: AC
  Filled 2015-03-09: qty 80

## 2015-03-09 MED ORDER — FENTANYL CITRATE (PF) 100 MCG/2ML IJ SOLN
50.0000 ug | INTRAMUSCULAR | Status: DC | PRN
  Administered 2015-03-09: 100 ug via INTRAVENOUS
  Administered 2015-03-09: 50 ug via INTRAVENOUS

## 2015-03-09 MED ORDER — GLYCOPYRROLATE 0.2 MG/ML IJ SOLN: 0.2000 mg | Freq: Once | INTRAMUSCULAR | Status: DC | PRN

## 2015-03-09 MED ORDER — OXYCODONE-ACETAMINOPHEN 5-325 MG PO TABS: 1.0000 | ORAL_TABLET | ORAL | Status: DC | PRN

## 2015-03-09 MED ORDER — PROPOFOL 500 MG/50ML IV EMUL
INTRAVENOUS | Status: AC
  Filled 2015-03-09: qty 50

## 2015-03-09 MED ORDER — FENTANYL CITRATE (PF) 100 MCG/2ML IJ SOLN
INTRAMUSCULAR | Status: AC
  Filled 2015-03-09: qty 2

## 2015-03-09 MED ORDER — CIPROFLOXACIN IN D5W 400 MG/200ML IV SOLN
400.0000 mg | INTRAVENOUS | Status: AC
  Administered 2015-03-09 (×2): 400 mg via INTRAVENOUS

## 2015-03-09 MED ORDER — BUPIVACAINE-EPINEPHRINE (PF) 0.25% -1:200000 IJ SOLN
INTRAMUSCULAR | Status: AC
  Filled 2015-03-09: qty 30

## 2015-03-09 MED ORDER — HYDROMORPHONE HCL 1 MG/ML IJ SOLN: 0.2500 mg | INTRAMUSCULAR | Status: DC | PRN

## 2015-03-09 MED ORDER — ACETAMINOPHEN 650 MG RE SUPP: 650.0000 mg | RECTAL | Status: DC | PRN

## 2015-03-09 MED ORDER — MIDAZOLAM HCL 2 MG/2ML IJ SOLN
1.0000 mg | INTRAMUSCULAR | Status: DC | PRN
  Administered 2015-03-09: 1 mg via INTRAVENOUS
  Administered 2015-03-09: 2 mg via INTRAVENOUS

## 2015-03-09 MED ORDER — BUPIVACAINE HCL 0.25 % IJ SOLN
INTRAMUSCULAR | Status: DC | PRN
  Administered 2015-03-09: 29 mL

## 2015-03-09 MED ORDER — SODIUM CHLORIDE 0.9 % IJ SOLN
INTRAMUSCULAR | Status: AC
  Filled 2015-03-09: qty 10

## 2015-03-09 MED ORDER — LACTATED RINGERS IV SOLN
INTRAVENOUS | Status: DC
  Administered 2015-03-09 (×2): via INTRAVENOUS

## 2015-03-09 MED ORDER — CIPROFLOXACIN IN D5W 400 MG/200ML IV SOLN
INTRAVENOUS | Status: AC
  Filled 2015-03-09: qty 200

## 2015-03-09 MED ORDER — SODIUM CHLORIDE 0.9 % IV SOLN: 250.0000 mL | INTRAVENOUS | Status: DC | PRN

## 2015-03-09 MED ORDER — FENTANYL CITRATE (PF) 100 MCG/2ML IJ SOLN
INTRAMUSCULAR | Status: AC
  Filled 2015-03-09: qty 6

## 2015-03-09 MED ORDER — SODIUM CHLORIDE 0.9 % IJ SOLN: 3.0000 mL | INTRAMUSCULAR | Status: DC | PRN

## 2015-03-09 MED ORDER — MIDAZOLAM HCL 5 MG/5ML IJ SOLN
INTRAMUSCULAR | Status: DC | PRN
  Administered 2015-03-09: 2 mg via INTRAVENOUS

## 2015-03-09 MED ORDER — DEXAMETHASONE SODIUM PHOSPHATE 4 MG/ML IJ SOLN
INTRAMUSCULAR | Status: DC | PRN
  Administered 2015-03-09: 10 mg via INTRAVENOUS
  Administered 2015-03-09: 4 mg via INTRAVENOUS

## 2015-03-09 MED ORDER — METHYLENE BLUE 1 % INJ SOLN
INTRAMUSCULAR | Status: AC
  Filled 2015-03-09: qty 10

## 2015-03-09 MED ORDER — FENTANYL CITRATE (PF) 100 MCG/2ML IJ SOLN
INTRAMUSCULAR | Status: DC | PRN
  Administered 2015-03-09: 25 ug via INTRAVENOUS
  Administered 2015-03-09: 50 ug via INTRAVENOUS
  Administered 2015-03-09 (×3): 25 ug via INTRAVENOUS
  Administered 2015-03-09: 50 ug via INTRAVENOUS

## 2015-03-09 MED ORDER — SODIUM CHLORIDE 0.9 % IJ SOLN: 3.0000 mL | Freq: Two times a day (BID) | INTRAMUSCULAR | Status: DC

## 2015-03-09 MED ORDER — OXYCODONE HCL 5 MG PO TABS: 5.0000 mg | ORAL_TABLET | ORAL | Status: DC | PRN

## 2015-03-09 MED ORDER — SCOPOLAMINE 1 MG/3DAYS TD PT72: 1.0000 | MEDICATED_PATCH | Freq: Once | TRANSDERMAL | Status: DC | PRN

## 2015-03-09 MED ORDER — ACETAMINOPHEN 325 MG PO TABS: 650.0000 mg | ORAL_TABLET | ORAL | Status: DC | PRN

## 2015-03-09 MED ORDER — BUPIVACAINE-EPINEPHRINE (PF) 0.5% -1:200000 IJ SOLN
INTRAMUSCULAR | Status: DC | PRN
  Administered 2015-03-09: 30 mL via PERINEURAL

## 2015-03-09 SURGICAL SUPPLY — 68 items
ADH SKN CLS APL DERMABOND .7 (GAUZE/BANDAGES/DRESSINGS) ×1
APPLIER CLIP 9.375 MED OPEN (MISCELLANEOUS)
APR CLP MED 9.3 20 MLT OPN (MISCELLANEOUS)
BINDER BREAST LRG (GAUZE/BANDAGES/DRESSINGS) IMPLANT
BINDER BREAST MEDIUM (GAUZE/BANDAGES/DRESSINGS) IMPLANT
BINDER BREAST XLRG (GAUZE/BANDAGES/DRESSINGS) IMPLANT
BINDER BREAST XXLRG (GAUZE/BANDAGES/DRESSINGS) ×2 IMPLANT
BLADE HEX COATED 2.75 (ELECTRODE) ×2 IMPLANT
BLADE SURG 10 STRL SS (BLADE) ×2 IMPLANT
BLADE SURG 15 STRL LF DISP TIS (BLADE) ×1 IMPLANT
BLADE SURG 15 STRL SS (BLADE) ×2
BNDG COHESIVE 4X5 TAN STRL (GAUZE/BANDAGES/DRESSINGS) ×2 IMPLANT
CANISTER SUC SOCK COL 7IN (MISCELLANEOUS) IMPLANT
CANISTER SUCT 1200ML W/VALVE (MISCELLANEOUS) ×2 IMPLANT
CHLORAPREP W/TINT 26ML (MISCELLANEOUS) ×2 IMPLANT
CLIP APPLIE 9.375 MED OPEN (MISCELLANEOUS) IMPLANT
CLIP TI LARGE 6 (CLIP) ×2 IMPLANT
CLIP TI MEDIUM 6 (CLIP) ×3 IMPLANT
CLIP TI WIDE RED SMALL 6 (CLIP) IMPLANT
COVER MAYO STAND STRL (DRAPES) ×2 IMPLANT
COVER PROBE W GEL 5X96 (DRAPES) ×2 IMPLANT
DECANTER SPIKE VIAL GLASS SM (MISCELLANEOUS) IMPLANT
DERMABOND ADVANCED (GAUZE/BANDAGES/DRESSINGS) ×1
DERMABOND ADVANCED .7 DNX12 (GAUZE/BANDAGES/DRESSINGS) ×1 IMPLANT
DEVICE DUBIN W/COMP PLATE 8390 (MISCELLANEOUS) ×2 IMPLANT
DRAPE UTILITY XL STRL (DRAPES) ×2 IMPLANT
DRSG PAD ABDOMINAL 8X10 ST (GAUZE/BANDAGES/DRESSINGS) IMPLANT
ELECT REM PT RETURN 9FT ADLT (ELECTROSURGICAL) ×2
ELECTRODE REM PT RTRN 9FT ADLT (ELECTROSURGICAL) ×1 IMPLANT
GLOVE BIO SURGEON STRL SZ 6 (GLOVE) ×2 IMPLANT
GLOVE BIO SURGEON STRL SZ 6.5 (GLOVE) ×2 IMPLANT
GLOVE BIOGEL PI IND STRL 6.5 (GLOVE) ×1 IMPLANT
GLOVE BIOGEL PI IND STRL 7.0 (GLOVE) IMPLANT
GLOVE BIOGEL PI INDICATOR 6.5 (GLOVE) ×1
GLOVE BIOGEL PI INDICATOR 7.0 (GLOVE) ×2
GLOVE ECLIPSE 6.5 STRL STRAW (GLOVE) ×1 IMPLANT
GOWN STRL REUS W/ TWL LRG LVL3 (GOWN DISPOSABLE) ×1 IMPLANT
GOWN STRL REUS W/TWL 2XL LVL3 (GOWN DISPOSABLE) ×2 IMPLANT
GOWN STRL REUS W/TWL LRG LVL3 (GOWN DISPOSABLE) ×4
KIT MARKER MARGIN INK (KITS) ×2 IMPLANT
NDL HYPO 25X1 1.5 SAFETY (NEEDLE) ×1 IMPLANT
NDL SAFETY ECLIPSE 18X1.5 (NEEDLE) IMPLANT
NEEDLE HYPO 18GX1.5 SHARP (NEEDLE)
NEEDLE HYPO 25X1 1.5 SAFETY (NEEDLE) ×2 IMPLANT
NS IRRIG 1000ML POUR BTL (IV SOLUTION) IMPLANT
PACK BASIN DAY SURGERY FS (CUSTOM PROCEDURE TRAY) ×2 IMPLANT
PACK UNIVERSAL I (CUSTOM PROCEDURE TRAY) ×2 IMPLANT
PENCIL BUTTON HOLSTER BLD 10FT (ELECTRODE) ×2 IMPLANT
SLEEVE SCD COMPRESS KNEE MED (MISCELLANEOUS) ×2 IMPLANT
SPONGE GAUZE 4X4 12PLY STER LF (GAUZE/BANDAGES/DRESSINGS) ×2 IMPLANT
SPONGE LAP 18X18 X RAY DECT (DISPOSABLE) ×3 IMPLANT
STAPLER VISISTAT 35W (STAPLE) IMPLANT
STOCKINETTE IMPERVIOUS LG (DRAPES) ×2 IMPLANT
STRIP CLOSURE SKIN 1/2X4 (GAUZE/BANDAGES/DRESSINGS) ×2 IMPLANT
SUT ETHILON 2 0 FS 18 (SUTURE) IMPLANT
SUT MNCRL AB 4-0 PS2 18 (SUTURE) ×2 IMPLANT
SUT MON AB 5-0 PS2 18 (SUTURE) IMPLANT
SUT SILK 2 0 SH (SUTURE) IMPLANT
SUT VIC AB 2-0 SH 27 (SUTURE) ×2
SUT VIC AB 2-0 SH 27XBRD (SUTURE) ×1 IMPLANT
SUT VIC AB 3-0 SH 27 (SUTURE) ×4
SUT VIC AB 3-0 SH 27X BRD (SUTURE) ×1 IMPLANT
SUT VIC AB 5-0 PS2 18 (SUTURE) IMPLANT
SYR CONTROL 10ML LL (SYRINGE) ×2 IMPLANT
TOWEL OR 17X24 6PK STRL BLUE (TOWEL DISPOSABLE) ×2 IMPLANT
TOWEL OR NON WOVEN STRL DISP B (DISPOSABLE) ×2 IMPLANT
TUBE CONNECTING 20X1/4 (TUBING) ×2 IMPLANT
YANKAUER SUCT BULB TIP NO VENT (SUCTIONS) IMPLANT

## 2015-03-09 NOTE — Discharge Instructions (Addendum)
[facility] °Office Phone Number [phone] ° °BREAST BIOPSY/ PARTIAL MASTECTOMY: POST OP INSTRUCTIONS ° °Always review your discharge instruction sheet given to you by the facility where your surgery was performed. ° °IF YOU HAVE DISABILITY OR FAMILY LEAVE FORMS, YOU MUST BRING THEM TO THE OFFICE FOR PROCESSING.  DO NOT GIVE THEM TO YOUR DOCTOR. ° °1. A prescription for pain medication may be given to you upon discharge.  Take your pain medication as prescribed, if needed.  If narcotic pain medicine is not needed, then you may take acetaminophen (Tylenol) or ibuprofen (Advil) as needed. °2. Take your usually prescribed medications unless otherwise directed °3. If you need a refill on your pain medication, please contact your pharmacy.  They will contact our office to request authorization.  Prescriptions will not be filled after 5pm or on week-ends. °4. You should eat very light the first 24 hours after surgery, such as soup, crackers, pudding, etc.  Resume your normal diet the day after surgery. °5. Most patients will experience some swelling and bruising in the breast.  Ice packs and a good support bra will help.  Swelling and bruising can take several days to resolve.  °6. It is common to experience some constipation if taking pain medication after surgery.  Increasing fluid intake and taking a stool softener will usually help or prevent this problem from occurring.  A mild laxative (Milk of Magnesia or Miralax) should be taken according to package directions if there are no bowel movements after 48 hours. °7. Unless discharge instructions indicate otherwise, you may remove your bandages 48 hours after surgery, and you may shower at that time.  You may have steri-strips (small skin tapes) in place directly over the incision.  These strips should be left on the skin for 7-10 days.   Any sutures or staples will be removed at the office during your follow-up visit. °8. ACTIVITIES:  You may resume  regular daily activities (gradually increasing) beginning the next day.  Wearing a good support bra or sports bra (or the breast binder) minimizes pain and swelling.  You may have sexual intercourse when it is comfortable. °a. You may drive when you no longer are taking prescription pain medication, you can comfortably wear a seatbelt, and you can safely maneuver your car and apply brakes. °b. RETURN TO WORK:  __________1 week_______________ °9. You should see your doctor in the office for a follow-up appointment approximately two weeks after your surgery.  Your doctor’s nurse will typically make your follow-up appointment when she calls you with your pathology report.  Expect your pathology report 2-3 business days after your surgery.  You may call to check if you do not hear from us after three days. ° ° °WHEN TO CALL YOUR DOCTOR: °1. Fever over 101.0 °2. Nausea and/or vomiting. °3. Extreme swelling or bruising. °4. Continued bleeding from incision. °5. Increased pain, redness, or drainage from the incision. ° °The clinic staff is available to answer your questions during regular business hours.  Please don’t hesitate to call and ask to speak to one of the nurses for clinical concerns.  If you have a medical emergency, go to the nearest emergency room or call 911.  A surgeon from [facility] is always on call at the hospital. ° °For further questions, please visit [email or web address]  ° ° °Post Anesthesia Home Care Instructions ° °Activity: °Get plenty of rest for the remainder of the day. A responsible adult should stay with you for 24   hours following the procedure.  °For the next 24 hours, DO NOT: °-Drive a car °-Operate machinery °-Drink alcoholic beverages °-Take any medication unless instructed by your physician °-Make any legal decisions or sign important papers. ° °Meals: °Start with liquid foods such as gelatin or soup. Progress to regular foods as tolerated. Avoid greasy, spicy, heavy  foods. If nausea and/or vomiting occur, drink only clear liquids until the nausea and/or vomiting subsides. Call your physician if vomiting continues. ° °Special Instructions/Symptoms: °Your throat may feel dry or sore from the anesthesia or the breathing tube placed in your throat during surgery. If this causes discomfort, gargle with warm salt water. The discomfort should disappear within 24 hours. ° °If you had a scopolamine patch placed behind your ear for the management of post- operative nausea and/or vomiting: ° °1. The medication in the patch is effective for 72 hours, after which it should be removed.  Wrap patch in a tissue and discard in the trash. Wash hands thoroughly with soap and water. °2. You may remove the patch earlier than 72 hours if you experience unpleasant side effects which may include dry mouth, dizziness or visual disturbances. °3. Avoid touching the patch. Wash your hands with soap and water after contact with the patch. °  ° °

## 2015-03-09 NOTE — Transfer of Care (Signed)
Immediate Anesthesia Transfer of Care Note  Patient: Jennifer Bartlett  Procedure(s) Performed: Procedure(s): RADIOACTIVE SEED GUIDED RIGHT PARTIAL MASTECTOMY WITH AXILLARY SENTINEL LYMPH NODE BIOPSY (Right)  Patient Location: PACU  Anesthesia Type:General  Level of Consciousness: sedated  Airway & Oxygen Therapy: Patient Spontanous Breathing and Patient connected to face mask oxygen  Post-op Assessment: Report given to RN and Post -op Vital signs reviewed and stable  Post vital signs: Reviewed and stable  Last Vitals:  Filed Vitals:   03/09/15 1210  BP:   Pulse: 82  Temp:   Resp: 13    Complications: No apparent anesthesia complications

## 2015-03-09 NOTE — Op Note (Signed)
Right Breast Radioactive seed localized lumpectomy and sentinel lymph node biopsy  Indications: This patient presents with history of Right breast cancer, upper inner quadrant, cT1bN0  Pre-operative Diagnosis: right breast cancer  Post-operative Diagnosis: right breast cancer  Surgeon: Stark Klein   Anesthesia: General endotracheal anesthesia  ASA Class: 2  Procedure Details  The patient was seen in the Holding Room. The risks, benefits, complications, treatment options, and expected outcomes were discussed with the patient. The possibilities of bleeding, infection, the need for additional procedures, failure to diagnose a condition, and creating a complication requiring transfusion or operation were discussed with the patient. The patient concurred with the proposed plan, giving informed consent.  The site of surgery properly noted/marked. The patient was taken to Operating Room # 8, identified, and the procedure verified as Right Breast Seed localized Lumpectomy with sentinel lymph node biopsy. A Time Out was held and the above information confirmed.  The right arm, breast, and chest were prepped and draped in standard fashion. The lumpectomy was performed by creating an transverse incision over the upper inner quadrant of the breast over the previously placed radioactive seed.  Dissection was carried down to around the point of maximum signal intensity. The cautery was used to perform the dissection.  Hemostasis was achieved with cautery. The edges of the cavity were marked with large clips, with one each medial, lateral, inferior and superior, and two clips posteriorly.   The specimen was inked with the margin marker paint kit.    Specimen radiography confirmed inclusion of the mammographic lesion, the clip, and the seed.  The background signal in the breast was zero.  The wound was irrigated and closed with 2-0 vicryl interrupted, 3-0 vicryl interrupted deep dermal, and 4-0 monocryl running  subcuticular suture in layers.  Using a hand-held gamma probe, right axillary sentinel nodes were identified transcutaneously.  An oblique incision was created below the axillary hairline.  Dissection was carried through the clavipectoral fascia.  5 level 2 axillary sentinel nodes were removed.  Counts per second were 1090, 210, 80, 40, and 590.    The background count was 20 cps.  The wound was irrigated.  Hemostasis was achieved with cautery.  The axillary incision was closed with a 3-0 vicryl deep dermal interrupted sutures and a 4-0 monocryl subcuticular closure.    Sterile dressings were applied. At the end of the operation, all sponge, instrument, and needle counts were correct.  Findings: grossly clear surgical margins and no adenopathy. Anterior margin is skin and posterior margin is pectoralis.    Estimated Blood Loss:  min         Specimens: right breast lumpectomy and right axillary sentinel lymph nodes, 1-5         Complications:  None; patient tolerated the procedure well.         Disposition: PACU - hemodynamically stable.         Condition: stable

## 2015-03-09 NOTE — Anesthesia Postprocedure Evaluation (Signed)
  Anesthesia Post-op Note  Patient: Jennifer Bartlett  Procedure(s) Performed: Procedure(s): RADIOACTIVE SEED GUIDED RIGHT PARTIAL MASTECTOMY WITH AXILLARY SENTINEL LYMPH NODE BIOPSY (Right)  Patient Location: PACU  Anesthesia Type:General and block  Level of Consciousness: awake and alert   Airway and Oxygen Therapy: Patient Spontanous Breathing  Post-op Pain: Controlled  Post-op Assessment: Post-op Vital signs reviewed, Patient's Cardiovascular Status Stable and Respiratory Function Stable  Post-op Vital Signs: Reviewed  Filed Vitals:   03/09/15 1500  BP: 177/77  Pulse: 76  Temp:   Resp: 20    Complications: No apparent anesthesia complications

## 2015-03-09 NOTE — Interval H&P Note (Signed)
History and Physical Interval Note:  03/09/2015 12:02 PM  Jennifer Bartlett  has presented today for surgery, with the diagnosis of right breast cancer  The various methods of treatment have been discussed with the patient and family. After consideration of risks, benefits and other options for treatment, the patient has consented to  Procedure(s): RADIOACTIVE SEED GUIDED RIGHT PARTIAL MASTECTOMY WITH AXILLARY SENTINEL LYMPH NODE BIOPSY (Right) as a surgical intervention .  The patient's history has been reviewed, patient examined, no change in status, stable for surgery.  I have reviewed the patient's chart and labs.  Questions were answered to the patient's satisfaction.     [name]

## 2015-03-09 NOTE — H&P (Signed)
Jennifer Bartlett 03/03/2015 8:29 AM Location: [facility] Patient #: [id number] DOB: 05-01-1946 Undefined / Language: Jennifer Bartlett / Race: White Female  History of Present Illness Stark Klein MD; 03/03/2015 9:51 AM) The patient is a 69 year old female who presents with breast cancer. Patient is a 69 year old female who presented with a screening detected breast mass. She is referred by the breast center of [facility] and Dr. Loura Pardon. She subsequently underwent diagnostic mammography. Imaging demonstrated a 5 mm mass in the upper inner quadrant of the right breast at 1:00. There was no evidence of lymphadenopathy. 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 This was a lobular phenotype. The hormone receptors were positive and the HER-2 neu was not overexpressed. Ki-67 was 3%. The patient had a paternal grandmother that she thinks had breast cancer around age 59 but she is not sure. She has no other personal history of cancer. She did use hormone replacement until around 3 weeks ago. She also used hormonal contraception for approximately 8-10 years. She is not up-to-date with her colonoscopy. She has had a bone density scan and a Pap smear. U/S mammogram FINDINGS: There is a persistent ill marginated few mm nodule in the right breast upper inner quadrant, far posterior depth. Mammographic images were processed with CAD. On physical exam, no suspicious masses are found. Targeted ultrasound is performed, showing right breast 1 o'clock 10 cm from the nipple irregular taller than wide 0.5 x 0.5 x 0.4 cm nodule with associated dense posterior acoustic shadowing. No evidence of right axillary lymphadenopathy. IMPRESSION: Right 1 o'clock small but suspicious nodule, for which ultrasound-guided core needle biopsy is recommended. RECOMMENDATION: Right breast ultrasound-guided core needle biopsy. I have discussed the findings  and recommendations with the patient. Results were also provided in writing at the conclusion of the visit. If applicable, a reminder letter will be sent to the patient regarding the next appointment. BI-RADS CATEGORY 4: Suspicious. Pathology Diagnosis Breast, right, needle core biopsy, 1 o'clock - INVASIVE MAMMARY CARCINOMA, SEE COMMENT. - LOBULAR NEOPLASIA (ATYPICAL LOBULAR HYPERPLASIA). Microscopic Comment The carcinoma has lobular features. E-cadherin will be performed and reported in an addendum. While grading is best performed on the excision specimen the carcinoma appears grade I. Prognostic markers will be ordered and reported in an addendum. Dr. [name] has reviewed the case. The case was called to [facility] on 02/25/2015.   Other Problems Jennifer Bartlett, CMA; 03/03/2015 8:29 AM) Anxiety Disorder Arthritis Atrial Fibrillation Back Pain Breast Cancer Gastroesophageal Reflux Disease Hemorrhoids Hypercholesterolemia Lump In Breast Migraine Headache Oophorectomy Bilateral. Other disease, cancer, significant illness  Past Surgical History Jennifer Bartlett, CMA; 03/03/2015 8:29 AM) Breast Biopsy Right. Gastric Bypass Hysterectomy (not due to cancer) - Complete Knee Surgery Left. Oral Surgery Resection of Stomach  Diagnostic Studies History Jennifer Bartlett, [place]; 03/03/2015 8:29 AM) Colonoscopy never Mammogram within last year Pap Smear 1-5 years ago  Social History Jennifer Bartlett, CMA; 03/03/2015 8:29 AM) Caffeine use Coffee. No alcohol use No drug use Tobacco use Never smoker.  Family History Jennifer Bartlett, [name]; 03/03/2015 8:29 AM) Alcohol Abuse Father. Anesthetic complications Mother. Breast Cancer Family Members In General. Heart Disease Father, Mother. Heart disease in female family member before age 68 Heart disease in female family member before age 26 Hypertension Mother. Thyroid problems Family Members In  General.  Pregnancy / Birth History Jennifer Bartlett, [place]; 03/03/2015 8:29 AM) Age at menarche 70 years. Age of menopause <  46 Contraceptive History Oral contraceptives. Gravida 2 Irregular periods Maternal age 4-25 Para 2  Review of Systems Jennifer Bartlett CMA; 03/03/2015 8:29 AM) General Present- Fatigue, Night Sweats and Weight Gain. Not Present- Appetite Loss, Chills, Fever and Weight Loss. Skin Present- Change in Wart/Mole and Dryness. Not Present- Hives, Jaundice, New Lesions, Non-Healing Wounds, Rash and Ulcer. HEENT Present- Seasonal Allergies and Wears glasses/contact lenses. Not Present- Earache, Hearing Loss, Hoarseness, Nose Bleed, Oral Ulcers, Ringing in the Ears, Sinus Pain, Sore Throat, Visual Disturbances and Yellow Eyes. Respiratory Present- Snoring. Not Present- Bloody sputum, Chronic Cough, Difficulty Breathing and Wheezing. Breast Not Present- Breast Mass, Breast Pain, Nipple Discharge and Skin Changes. Cardiovascular Not Present- Chest Pain, Difficulty Breathing Lying Down, Leg Cramps, Palpitations, Rapid Heart Rate, Shortness of Breath and Swelling of Extremities. Gastrointestinal Present- Bloating, Excessive gas and Gets full quickly at meals. Not Present- Abdominal Pain, Bloody Stool, Change in Bowel Habits, Chronic diarrhea, Constipation, Difficulty Swallowing, Hemorrhoids, Indigestion, Nausea, Rectal Pain and Vomiting. Female Genitourinary Not Present- Frequency, Nocturia, Painful Urination, Pelvic Pain and Urgency. Musculoskeletal Present- Back Pain and Joint Pain. Not Present- Joint Stiffness, Muscle Pain, Muscle Weakness and Swelling of Extremities. Neurological Present- Headaches. Not Present- Decreased Memory, Fainting, Numbness, Seizures, Tingling, Tremor, Trouble walking and Weakness. Psychiatric Present- Anxiety. Not Present- Bipolar, Change in Sleep Pattern, Depression, Fearful and Frequent crying. Endocrine Present- Excessive Hunger and Heat Intolerance. Not  Present- Cold Intolerance, Hair Changes, Hot flashes and New Diabetes. Hematology Present- Easy Bruising. Not Present- Excessive bleeding, Gland problems, HIV and Persistent Infections.   Vitals Stark Klein MD; 03/03/2015 9:44 AM) 03/03/2015 9:44 AM Weight: 208 lb Height: 64.5in Body Surface Area: 2.07 m Body Mass Index: 35.15 kg/m Temp.: 97.15F  Pulse: 98 (Regular)  Resp.: 18 (Unlabored)  BP: 147/59 (Sitting, Left Arm, Standard)    Physical Exam Stark Klein MD; 03/03/2015 9:49 AM) General Mental Status-Alert. General Appearance-Consistent with stated age. Hydration-Well hydrated. Voice-Normal.  Head and Neck Head-normocephalic, atraumatic with no lesions or palpable masses. Trachea-midline. Thyroid Gland Characteristics - normal size and consistency.  Eye Eyeball - Bilateral-Extraocular movements intact. Sclera/Conjunctiva - Bilateral-No scleral icterus.  Chest and Lung Exam Chest and lung exam reveals -quiet, even and easy respiratory effort with no use of accessory muscles and on auscultation, normal breath sounds, no adventitious sounds and normal vocal resonance. Inspection Chest Wall - Normal. Back - normal.  Breast Note: No palpable masses. Chronic mild nipple retraction on right. No lymphadenopathy. no skin dimpling.   Cardiovascular Cardiovascular examination reveals -normal heart sounds, regular rate and rhythm with no murmurs and normal pedal pulses bilaterally.  Abdomen Inspection Inspection of the abdomen reveals - Note: small umbilical hernia. Palpation/Percussion Palpation and Percussion of the abdomen reveal - Soft, Non Tender, No Rebound tenderness, No Rigidity (guarding) and No hepatosplenomegaly. Auscultation Auscultation of the abdomen reveals - Bowel sounds normal.  Neurologic Neurologic evaluation reveals -alert and oriented x 3 with no impairment of recent or remote memory. Mental  Status-Normal.  Musculoskeletal Global Assessment -Note: no gross deformities.  Normal Exam - Left-Upper Extremity Strength Normal and Lower Extremity Strength Normal. Normal Exam - Right-Upper Extremity Strength Normal and Lower Extremity Strength Normal.  Lymphatic Head & Neck  General Head & Neck Lymphatics: Bilateral - Description - Normal. Axillary  General Axillary Region: Bilateral - Description - Normal. Tenderness - Non Tender. Femoral & Inguinal  Generalized Femoral & Inguinal Lymphatics: Bilateral - Description - No Generalized lymphadenopathy.    Assessment & Plan Stark Klein MD; 03/03/2015 9:54  AM) CARCINOMA OF RIGHT BREAST UPPER INNER QUADRANT (174.2  C50.211) Impression: The patient has a new stage I right breast cancer. Clinical stage is T1 1 N0 M0. I reviewed the plan for breast conservation with the patient. We will perform a seed localized lumpectomy and sentinel lymph node biopsy. The plan would be to follow this with radiation and an anti-estrogen treatment. Because of the small size of the tumor and low Ki-67, she would not get an Oncotype.  I discussed the surgery with the patient. I reviewed the timeline of the events that happened with surgery. I discussed the risks of surgery including bleeding, infection, damage to adjacent structures, possibility of positive margins and need for additional procedures, possible heart or lung events, and possible need to drain seroma.  Spent 45 min with evaluation, examination, counseling, and coordination of care. Current Plans  Schedule for Surgery Pt Education - [facility]b breast cancer surgery: discussed with patient and provided information.   Signed by Stark Klein, MD (03/03/2015 9:55 AM)

## 2015-03-09 NOTE — Anesthesia Procedure Notes (Addendum)
Anesthesia Regional Block:  Pectoralis block  Pre-Anesthetic Checklist: ,, timeout performed, Correct Patient, Correct Site, Correct Laterality, Correct Procedure, Correct Position, site marked, Risks and benefits discussed, pre-op evaluation, post-op pain management  Laterality: Right  Prep: Maximum Sterile Barrier Precautions used and chloraprep       Needles:  Injection technique: Single-shot  Needle Type: Echogenic Stimulator Needle     Needle Length: 9cm 9 cm Needle Gauge: 21 and 21 G    Additional Needles:  Procedures: ultrasound guided (picture in chart) Pectoralis block Narrative:  Start time: 03/09/2015 11:47 AM End time: 03/09/2015 11:57 AM Injection made incrementally with aspirations every 5 mL. Anesthesiologist: Roderic Palau  Additional Notes: 2% Lidocaine skin wheel.   Procedure Name: LMA Insertion Date/Time: 03/09/2015 12:23 PM Performed by: Toula Moos L Pre-anesthesia Checklist: Patient identified, Emergency Drugs available, Suction available, Patient being monitored and Timeout performed Patient Re-evaluated:Patient Re-evaluated prior to inductionOxygen Delivery Method: Circle System Utilized Preoxygenation: Pre-oxygenation with 100% oxygen Intubation Type: IV induction Ventilation: Mask ventilation without difficulty LMA: LMA inserted LMA Size: 4.0 Number of attempts: 1 Airway Equipment and Method: Bite block Placement Confirmation: positive ETCO2 Tube secured with: Tape Dental Injury: Teeth and Oropharynx as per pre-operative assessment

## 2015-03-09 NOTE — Anesthesia Preprocedure Evaluation (Addendum)
Anesthesia Evaluation  Patient identified by MRN, date of birth, ID band Patient awake    Reviewed: Allergy & Precautions, H&P , NPO status , Patient's Chart, lab work & pertinent test results  Airway Mallampati: II  TM Distance: >3 FB Neck ROM: Full    Dental no notable dental hx. (+) Edentulous Upper, Partial Lower, Dental Advisory Given   Pulmonary neg pulmonary ROS,  breath sounds clear to auscultation  Pulmonary exam normal       Cardiovascular hypertension, Pt. on medications Rhythm:Regular Rate:Normal     Neuro/Psych Anxiety negative neurological ROS     GI/Hepatic Neg liver ROS, GERD-  Medicated and Controlled,  Endo/Other  negative endocrine ROS  Renal/GU negative Renal ROS  negative genitourinary   Musculoskeletal   Abdominal   Peds  Hematology negative hematology ROS (+)   Anesthesia Other Findings   Reproductive/Obstetrics negative OB ROS                            Anesthesia Physical Anesthesia Plan  ASA: II  Anesthesia Plan: General and Regional   Post-op Pain Management: GA combined w/ Regional for post-op pain   Induction: Intravenous  Airway Management Planned: LMA  Additional Equipment:   Intra-op Plan:   Post-operative Plan: Extubation in OR  Informed Consent: I have reviewed the patients History and Physical, chart, labs and discussed the procedure including the risks, benefits and alternatives for the proposed anesthesia with the patient or authorized representative who has indicated his/her understanding and acceptance.   Dental advisory given  Plan Discussed with: CRNA  Anesthesia Plan Comments:         Anesthesia Quick Evaluation

## 2015-03-10 ENCOUNTER — Encounter (HOSPITAL_BASED_OUTPATIENT_CLINIC_OR_DEPARTMENT_OTHER): Payer: Self-pay | Admitting: General Surgery

## 2015-03-12 ENCOUNTER — Telehealth: Payer: Self-pay | Admitting: *Deleted

## 2015-03-12 NOTE — Telephone Encounter (Signed)
-----   Message from Rockwell Germany, RN sent at 03/11/2015  2:26 PM EDT ----- Regarding: Care Plan Patient was seen in [facility] 03/03/15.  She is scheduled for the following.  Surgery - 8/9 Dr. Jana Hakim - 10/28 Dr. Valere Dross - 8/23  Please let me know if you have any questions.  Thanks,  Varney Biles

## 2015-03-14 NOTE — Progress Notes (Signed)
Quick Note:  Please let patient know margins and lymph nodes are negative. ______

## 2015-03-18 ENCOUNTER — Institutional Professional Consult (permissible substitution): Payer: Medicare Other | Admitting: Radiation Oncology

## 2015-03-18 ENCOUNTER — Ambulatory Visit: Payer: Medicare Other

## 2015-03-21 ENCOUNTER — Other Ambulatory Visit: Payer: Self-pay | Admitting: Family Medicine

## 2015-03-22 ENCOUNTER — Encounter: Payer: Self-pay | Admitting: *Deleted

## 2015-03-22 NOTE — Progress Notes (Signed)
Location of Breast Cancer: Right Breast, Upper Inner  Histology per Pathology Report:  8/9/16IAGNOSIS Diagnosis 1. Breast, lumpectomy, Right - INVASIVE LOBULAR CARCINOMA, GRADE 2/3, SPANNING 1.0 CM. - LOBULAR CARCINOMA IN SITU. - THE SURGICAL RESECTION MARGINS ARE NEGATIVE FOR INVASIVE CARCINOMA. - SEE ONCOLOGY TABLE BELOW. 2. Lymph node, sentinel, biopsy, Right axillary #1 - THERE IS NO EVIDENCE OF CARCINOMA IN 1 OF 1 LYMPH NODE (0/1). - SEE COMMENT. 3. Lymph node, sentinel, biopsy, Right axillary #2 - THERE IS NO EVIDENCE OF CARCINOMA IN 1 OF 1 LYMPH NODE (0/1). - SEE COMMENT. 1 of 4 FINAL for Jennifer Bartlett, Jennifer Bartlett A ([id number]) Diagnosis(continued) 4. Lymph node, sentinel, biopsy, Right axillary #3 - THERE IS NO EVIDENCE OF CARCINOMA IN 1 OF 1 LYMPH NODE (0/1). - SEE COMMENT. 5. Lymph node, sentinel, biopsy, Right axillary #4 - THERE IS NO EVIDENCE OF CARCINOMA IN 1 OF 1 LYMPH NODE (0/1). - SEE COMMENT. 6. Lymph node, sentinel, biopsy, Right axillary #5 - THERE IS NO EVIDENCE  02/24/15 Diagnosis Breast, right, needle core biopsy, 1 o'clock - INVASIVE MAMMARY CARCINOMA, SEE COMMENT. - LOBULAR NEOPLASIA (ATYPICAL LOBULAR HYPERPLASIA).  Receptor Status: ER(95%), PR (60%), Her2-neu (NEG), Ki-67(3%)  At the time of a screening mammogram on 02/05/2015 at the [facility] she was found have a possible mass within the right breast. Additional diagnostic views showed a mass deep within the upper inner quadrant of the right breast which on ultrasound measured 0.5 x 0.5 x 0.4 cm at 1:00 him a 10 cm from the nipple. The axilla was benign on ultrasound. A needle core biopsy on 02/24/2015 was diagnostic for invasive lobular carcinoma, grade 1 and this was confirmed by Ecadherin staining which was negative. The carcinoma was ER/PR positive, HER-2/neu negative with a Ki-67 of 3%  Past/Anticipated interventions by surgeon, if OAC:[id number] Right Breast  Past/Anticipated interventions by  medical oncology, if any: Plan Anti-estrogen    Lymphedema issues, if any: No  Pain issues, if any:  Tenderness off right lateal breast incision  SAFETY ISSUES:  Prior radiation? No  Pacemaker/ICD? No  Possible current pregnancy?No  Is the patient on methotrexate? No  Current Complaints / other details:    Age at menarche 69 years. Age of menopause <46 Contraceptive History Oral contraceptives ~20-25 years Gravida 2 Irregular periods Maternal age 52-25 Para 2    [name], Crista Curb, RN 03/22/2015,6:30 PM

## 2015-03-23 ENCOUNTER — Encounter: Payer: Self-pay | Admitting: Radiation Oncology

## 2015-03-23 ENCOUNTER — Ambulatory Visit
Admit: 2015-03-23 | Discharge: 2015-03-23 | Disposition: A | Discharge status: Home / Self Care | Payer: Medicare Other | Attending: Radiation Oncology | Admitting: Radiation Oncology

## 2015-03-23 ENCOUNTER — Ambulatory Visit: Payer: Medicare Other | Admitting: Radiation Oncology

## 2015-03-23 VITALS — BP 154/58 | HR 61 | Temp 97.8°F | Ht 65.0 in | Wt 208.7 lb

## 2015-03-23 DIAGNOSIS — Z510 Encounter for antineoplastic radiation therapy: Secondary | ICD-10-CM | POA: Diagnosis not present

## 2015-03-23 DIAGNOSIS — C50911 Malignant neoplasm of unspecified site of right female breast: Secondary | ICD-10-CM | POA: Diagnosis present

## 2015-03-23 DIAGNOSIS — C50211 Malignant neoplasm of upper-inner quadrant of right female breast: Secondary | ICD-10-CM

## 2015-03-23 NOTE — Addendum Note (Signed)
Encounter addended by: Benn Moulder, RN on: 03/23/2015  5:37 PM<BR>     Documentation filed: Charges VN

## 2015-03-23 NOTE — Progress Notes (Signed)
CC: Dr. Stark Klein, Dr. Loura Pardon   Follow-up note:  Diagnosis: Stage I A (T1b N0 M0) invasive lobular carcinoma of the right breast  Jennifer Bartlett is a pleasant 69 year old female who is seen today for review and scheduling of her radiation therapy following conservative surgery in the management of her T1b N0 invasive lobular carcinoma the right breast.  I first saw the patient in consultation at the breast multidisciplinary clinic on 03/03/2015. At the time of a screening mammogram on 02/05/2015 at the [facility] she was found have a possible mass within the right breast. Additional diagnostic views showed a mass deep within the upper inner quadrant of the right breast which on ultrasound measured 0.5 x 0.5 x 0.4 cm at 1:00,  10 cm from the nipple. The axilla was benign on ultrasound. A needle core biopsy on 02/24/2015 was diagnostic for invasive lobular carcinoma, grade 1 and this was confirmed by E-cadherin staining which was negative. The carcinoma was ER/PR positive, HER-2/neu negative with a Ki-67 of 3%.On 03/09/2015 showed underwent a right partial mastectomy and sentinel lymph node biopsy.  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  Surgical margins were at least 0.2 cm.  5 sentinel lymph nodes were free of metastatic disease.  I understand that Dr. Jana Hakim will be offering her adjuvant antiestrogen therapy following completion of radiation therapy.  Physical examination: Alert and oriented. Filed Vitals:   03/23/15 0854  BP: 154/58  Pulse: 61  Temp: 97.8 F (36.6 C)   Head and neck examination: Grossly unremarkable.  Nodes: Without palpable cervical, supraclavicular, or axillary lymphadenopathy.  Breasts: There is a partial mastectomy wound along the upper inner quadrant of the right breast extending from 1 to 2:00.  No masses are appreciated.  The wound is healing well.  Left breast without masses or lesions.  Extremities: Without  edema.  Impression: Stage I A (T1b N0 M0) invasive lobular carcinoma of the right breast.  She is a good candidate for breast conservation.  We again discussed the potential acute and late toxicities of radiation therapy.  We discussed the treatment planning process.  I believe she is a good candidate for hypofractionated treatment with no need for a boost.  Consent is signed today.  Plan: As above.  She will see Dr. Barry Dienes for a follow-up visit later this week.  We will have her return next week for CT simulation.  30 minutes was spent face-to-face the patient, primarily counseling patient and coordinating her care.

## 2015-03-27 ENCOUNTER — Other Ambulatory Visit: Payer: Self-pay | Admitting: Family Medicine

## 2015-03-29 ENCOUNTER — Ambulatory Visit
Admission: RE | Admit: 2015-03-29 | Discharge: 2015-03-29 | Disposition: A | Discharge status: Home / Self Care | Payer: Medicare Other | Source: Ambulatory Visit | Attending: Radiation Oncology | Admitting: Radiation Oncology

## 2015-03-29 DIAGNOSIS — C50211 Malignant neoplasm of upper-inner quadrant of right female breast: Secondary | ICD-10-CM

## 2015-03-29 DIAGNOSIS — Z510 Encounter for antineoplastic radiation therapy: Secondary | ICD-10-CM | POA: Diagnosis not present

## 2015-03-29 NOTE — Progress Notes (Addendum)
Complex simulation/treatment planning note: the patient was taken to the CT simulator.  She was placed supine. A Vac lock immobilization device was constructed on a custom breast board.  Her right breast field borders were marked with radiopaque wires.  Her partial mastectomy scar was also marked. She was then scanned.  I chose an isocenter. The CT data set was sent to the planning system where contoured her tumor bed. Dosimetry contoured her lungs and heart.  Based on her  field separation of over 28 cm, she is not a candidate for hypofractionated treatment.  Therefore, I am prescribing 4860 cGy in 27 sessions,  No boost. She is now ready for 3-D simulation.

## 2015-03-30 ENCOUNTER — Encounter: Payer: Self-pay | Admitting: Radiation Oncology

## 2015-03-30 DIAGNOSIS — Z510 Encounter for antineoplastic radiation therapy: Secondary | ICD-10-CM | POA: Diagnosis not present

## 2015-03-30 NOTE — Progress Notes (Signed)
3-D simulation note: The patient completed 3-D simulation for treatment to her right breast.  She was set up to medial and lateral tangential fields with 2 unique multileaf collimators and also 2 unique electronic compensation fields for a total of 4 complex treatment devices.  Dose volume histograms were obtained for the target structures including her tumor bed and also avoidance structures including the heart and lungs.  We met our departmental guidelines.  I'm prescribing 4860 cGy in 27 sessions utilizing 10 MV photons for all fields.  Dose is prescribed to the 100% isodose curve which gives a minimum dose of 98% of the prescribed dose to her tumor bed.

## 2015-04-03 ENCOUNTER — Other Ambulatory Visit: Payer: Self-pay | Admitting: Family Medicine

## 2015-04-06 ENCOUNTER — Ambulatory Visit
Admission: RE | Admit: 2015-04-06 | Discharge: 2015-04-06 | Disposition: A | Discharge status: Home / Self Care | Payer: Medicare Other | Source: Ambulatory Visit | Attending: Radiation Oncology | Admitting: Radiation Oncology

## 2015-04-06 DIAGNOSIS — Z510 Encounter for antineoplastic radiation therapy: Secondary | ICD-10-CM | POA: Diagnosis not present

## 2015-04-07 ENCOUNTER — Ambulatory Visit
Admission: RE | Admit: 2015-04-07 | Discharge: 2015-04-07 | Disposition: A | Discharge status: Home / Self Care | Payer: Medicare Other | Source: Ambulatory Visit | Attending: Radiation Oncology | Admitting: Radiation Oncology

## 2015-04-07 DIAGNOSIS — Z510 Encounter for antineoplastic radiation therapy: Secondary | ICD-10-CM | POA: Diagnosis not present

## 2015-04-07 DIAGNOSIS — C50211 Malignant neoplasm of upper-inner quadrant of right female breast: Secondary | ICD-10-CM

## 2015-04-07 MED ORDER — RADIAPLEXRX EX GEL
Freq: Once | CUTANEOUS | Status: AC
  Administered 2015-04-07: 18:00:00 via TOPICAL

## 2015-04-07 MED ORDER — ALRA NON-METALLIC DEODORANT (RAD-ONC)
1.0000 "application " | Freq: Once | TOPICAL | Status: AC
  Administered 2015-04-07: 1 via TOPICAL

## 2015-04-07 NOTE — Progress Notes (Signed)
RADIATION TREATMENT  SKIN CARE-BREAST    RECOMMENDATIONS: ? Use unscented soap (Dove) ? When showering it is fine for water to touch the area, but please avoid direct spray on the treatment field if skin becomes irritated.  Also, wash inside and around the marked area ? When drying gently blot the area  ? PLEASE DO NOT APPLY ANY OTHER CREAMS, LOTIONS, POWDERS, PERFUMES, OILS OR ALCOHOL PRODUCTS (OTHER THAN WHAT IS GIVEN TO YOU BY THE RADIATION TEAM) TO THE TREATMENT AREA DURING RADIATION THERAPY   SKIN CARE: ? Moisturizer o You will be given (Radiaplex Gel) to use. Apply twice daily, once after treatment and then again prior to bedtime o Your Radiation Oncologist may suggest other skin care products as needed  DEODORANT:  ? Nursing will provide deodorant (ALRA) to be used during your radiation treatment  Deodorant Alternatives: ? A combination of equal amounts of baking soda and corn starch.  Mix together and powder puff on ? Toms of [place]-available at most [employer] Stores ([employer], [employer]) ? Thai Crystal Deodorant Mist-100% Natural Mineral Salts and Purified water ([employer], [employer])   PLEASE DO NOT APPLY THE RADIAPLEX GEL OR ALRA DEODORANT WITHIN 4 HOURS PRIOR TO RADIATION TREATMENT  

## 2015-04-08 ENCOUNTER — Ambulatory Visit
Admission: RE | Admit: 2015-04-08 | Discharge: 2015-04-08 | Disposition: A | Discharge status: Home / Self Care | Payer: Medicare Other | Source: Ambulatory Visit | Attending: Radiation Oncology | Admitting: Radiation Oncology

## 2015-04-08 DIAGNOSIS — Z510 Encounter for antineoplastic radiation therapy: Secondary | ICD-10-CM | POA: Diagnosis not present

## 2015-04-09 ENCOUNTER — Ambulatory Visit
Admission: RE | Admit: 2015-04-09 | Discharge: 2015-04-09 | Disposition: A | Discharge status: Home / Self Care | Payer: Medicare Other | Source: Ambulatory Visit | Attending: Radiation Oncology | Admitting: Radiation Oncology

## 2015-04-09 DIAGNOSIS — Z510 Encounter for antineoplastic radiation therapy: Secondary | ICD-10-CM | POA: Diagnosis not present

## 2015-04-12 ENCOUNTER — Ambulatory Visit
Admission: RE | Admit: 2015-04-12 | Discharge: 2015-04-12 | Disposition: A | Discharge status: Home / Self Care | Payer: Medicare Other | Source: Ambulatory Visit | Attending: Radiation Oncology | Admitting: Radiation Oncology

## 2015-04-12 VITALS — BP 161/67 | HR 66 | Temp 98.3°F | Wt 209.2 lb

## 2015-04-12 DIAGNOSIS — Z510 Encounter for antineoplastic radiation therapy: Secondary | ICD-10-CM | POA: Diagnosis not present

## 2015-04-12 DIAGNOSIS — C50211 Malignant neoplasm of upper-inner quadrant of right female breast: Secondary | ICD-10-CM

## 2015-04-12 NOTE — Progress Notes (Signed)
Weekly Management Note:  Site: Right breast Current Dose:  720  cGy Projected Dose: 4860  cGy  Narrative: The patient is seen today for routine under treatment assessment. CBCT/MVCT images/port films were reviewed. The chart was reviewed.   She is without complaints today.  She uses Radioplex gel.  Physical Examination:  Filed Vitals:   04/12/15 0843  BP: 161/67  Pulse: 66  Temp: 98.3 F (36.8 C)  .  Weight: 209 lb 3.2 oz (94.892 kg).  There are no significant skin changes.  Impression: Tolerating radiation therapy well.  Plan: Continue radiation therapy as planned.

## 2015-04-12 NOTE — Progress Notes (Signed)
Weekly assessment of radiation to right breast.Completed 4 of 27 treatments.Denies Pain.No skin changes.Continue application of radiaplex twice daily.

## 2015-04-13 ENCOUNTER — Telehealth: Payer: Self-pay | Admitting: *Deleted

## 2015-04-13 ENCOUNTER — Ambulatory Visit
Admission: RE | Admit: 2015-04-13 | Discharge: 2015-04-13 | Disposition: A | Discharge status: Home / Self Care | Payer: Medicare Other | Source: Ambulatory Visit | Attending: Radiation Oncology | Admitting: Radiation Oncology

## 2015-04-13 DIAGNOSIS — Z510 Encounter for antineoplastic radiation therapy: Secondary | ICD-10-CM | POA: Diagnosis not present

## 2015-04-13 NOTE — Telephone Encounter (Signed)
Spoke with patient to follow up after 1st XRT.  She is doing well. She is having some fatigue.  Encouraged her to call with any needs or concerns.

## 2015-04-14 ENCOUNTER — Ambulatory Visit (INDEPENDENT_AMBULATORY_CARE_PROVIDER_SITE_OTHER): Payer: Medicare Other | Admitting: *Deleted

## 2015-04-14 ENCOUNTER — Ambulatory Visit: Payer: Medicare Other

## 2015-04-14 ENCOUNTER — Ambulatory Visit
Admission: RE | Admit: 2015-04-14 | Discharge: 2015-04-14 | Disposition: A | Discharge status: Home / Self Care | Payer: Medicare Other | Source: Ambulatory Visit | Attending: Radiation Oncology | Admitting: Radiation Oncology

## 2015-04-14 DIAGNOSIS — E538 Deficiency of other specified B group vitamins: Secondary | ICD-10-CM

## 2015-04-14 DIAGNOSIS — Z510 Encounter for antineoplastic radiation therapy: Secondary | ICD-10-CM | POA: Diagnosis not present

## 2015-04-14 MED ORDER — CYANOCOBALAMIN 1000 MCG/ML IJ SOLN
1000.0000 ug | Freq: Once | INTRAMUSCULAR | Status: AC
  Administered 2015-04-14: 1000 ug via INTRAMUSCULAR

## 2015-04-15 ENCOUNTER — Ambulatory Visit
Admission: RE | Admit: 2015-04-15 | Discharge: 2015-04-15 | Disposition: A | Discharge status: Home / Self Care | Payer: Medicare Other | Source: Ambulatory Visit | Attending: Radiation Oncology | Admitting: Radiation Oncology

## 2015-04-15 DIAGNOSIS — Z510 Encounter for antineoplastic radiation therapy: Secondary | ICD-10-CM | POA: Diagnosis not present

## 2015-04-16 ENCOUNTER — Ambulatory Visit
Admission: RE | Admit: 2015-04-16 | Discharge: 2015-04-16 | Disposition: A | Discharge status: Home / Self Care | Payer: Medicare Other | Source: Ambulatory Visit | Attending: Radiation Oncology | Admitting: Radiation Oncology

## 2015-04-16 DIAGNOSIS — Z510 Encounter for antineoplastic radiation therapy: Secondary | ICD-10-CM | POA: Diagnosis not present

## 2015-04-16 DIAGNOSIS — C50211 Malignant neoplasm of upper-inner quadrant of right female breast: Secondary | ICD-10-CM

## 2015-04-16 MED ORDER — RADIAPLEXRX EX GEL
Freq: Once | CUTANEOUS | Status: AC
Start: 2015-04-16 — End: 2015-04-16
  Administered 2015-04-16: 09:00:00 via TOPICAL

## 2015-04-19 ENCOUNTER — Ambulatory Visit: Admission: RE | Admit: 2015-04-19 | Payer: Medicare Other | Source: Ambulatory Visit

## 2015-04-19 ENCOUNTER — Ambulatory Visit
Admission: RE | Admit: 2015-04-19 | Discharge: 2015-04-19 | Disposition: A | Discharge status: Home / Self Care | Payer: Medicare Other | Source: Ambulatory Visit | Attending: Radiation Oncology | Admitting: Radiation Oncology

## 2015-04-19 ENCOUNTER — Encounter: Payer: Self-pay | Admitting: Radiation Oncology

## 2015-04-19 VITALS — BP 172/69 | HR 68 | Temp 97.7°F | Ht 65.0 in | Wt 213.0 lb

## 2015-04-19 DIAGNOSIS — Z510 Encounter for antineoplastic radiation therapy: Secondary | ICD-10-CM | POA: Diagnosis not present

## 2015-04-19 DIAGNOSIS — C50211 Malignant neoplasm of upper-inner quadrant of right female breast: Secondary | ICD-10-CM

## 2015-04-19 NOTE — Progress Notes (Signed)
[name] presents today with redness on the lateral right breast with report of the area draining pus as of yesterday.  She grades "tenderness" in this area as a level 5/10. Afebrile

## 2015-04-19 NOTE — Addendum Note (Signed)
Encounter addended by: Benn Moulder, RN on: 04/19/2015 10:46 AM<BR>     Documentation filed: Orders, Dx [employer]

## 2015-04-19 NOTE — Progress Notes (Signed)
Weekly Management Note:  Site: Right breast Current Dose:  1620  cGy Projected Dose: 4860  cGy  Narrative: The patient is seen today for routine under treatment assessment. CBCT/MVCT images/port films were reviewed. The chart was reviewed.   The patient is seen today for weekly management.  Over the weekend she developed drainage from a surgical wound along the upper outer quadrant of right breast.  This was initially purulent but is now somewhat clear.  She does give a questionable history of chills but no fever over the weekend.  The area is tender and erythematous.  She is allergic to penicillin.  Physical Examination:  Filed Vitals:   04/19/15 0814  BP: 172/69  Pulse: 68  Temp: 97.7 F (36.5 C)  .  Weight: 213 lb (96.616 kg).  There is erythema and very slight drainage along the upper-outer wound of her right breast.      Impression: I'm concerned that she may have an abscess.  We did culture the drainage this morning.  I will contact Dr. Barry Dienes to see if she has a choice of antibiotics knowing that this lady is allergic to penicillin.  Plan: Continue radiation therapy as planned.  We will continue with radiation therapy for now and I will see her later this week.

## 2015-04-20 ENCOUNTER — Encounter: Payer: Self-pay | Admitting: Radiation Oncology

## 2015-04-20 ENCOUNTER — Ambulatory Visit
Admission: RE | Admit: 2015-04-20 | Discharge: 2015-04-20 | Disposition: A | Discharge status: Home / Self Care | Payer: Medicare Other | Source: Ambulatory Visit | Attending: Radiation Oncology | Admitting: Radiation Oncology

## 2015-04-20 DIAGNOSIS — Z510 Encounter for antineoplastic radiation therapy: Secondary | ICD-10-CM | POA: Diagnosis not present

## 2015-04-20 NOTE — Progress Notes (Signed)
Chart note: I spoke with the patient yesterday evening.  She was seen by Dr. Barry Dienes.  She describes having her wound/abscess probed, and then placed on antibiotic (question doxycycline).  She will bring in her prescription for [place] to update her medication list.  We will await the results of her culture that we obtained yesterday morning.

## 2015-04-21 ENCOUNTER — Ambulatory Visit
Admission: RE | Admit: 2015-04-21 | Discharge: 2015-04-21 | Disposition: A | Discharge status: Home / Self Care | Payer: Medicare Other | Source: Ambulatory Visit | Attending: Radiation Oncology | Admitting: Radiation Oncology

## 2015-04-21 DIAGNOSIS — Z510 Encounter for antineoplastic radiation therapy: Secondary | ICD-10-CM | POA: Diagnosis not present

## 2015-04-22 ENCOUNTER — Ambulatory Visit
Admission: RE | Admit: 2015-04-22 | Discharge: 2015-04-22 | Disposition: A | Discharge status: Home / Self Care | Payer: Medicare Other | Source: Ambulatory Visit | Attending: Radiation Oncology | Admitting: Radiation Oncology

## 2015-04-22 ENCOUNTER — Encounter: Payer: Self-pay | Admitting: Radiation Oncology

## 2015-04-22 DIAGNOSIS — Z510 Encounter for antineoplastic radiation therapy: Secondary | ICD-10-CM | POA: Diagnosis not present

## 2015-04-22 LAB — WOUND CULTURE

## 2015-04-22 NOTE — Progress Notes (Signed)
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  Dr. Barry Dienes is changing her to Bactrim.

## 2015-04-23 ENCOUNTER — Ambulatory Visit
Admission: RE | Admit: 2015-04-23 | Discharge: 2015-04-23 | Disposition: A | Discharge status: Home / Self Care | Payer: Medicare Other | Source: Ambulatory Visit | Attending: Radiation Oncology | Admitting: Radiation Oncology

## 2015-04-23 DIAGNOSIS — Z510 Encounter for antineoplastic radiation therapy: Secondary | ICD-10-CM | POA: Diagnosis not present

## 2015-04-26 ENCOUNTER — Ambulatory Visit
Admission: RE | Admit: 2015-04-26 | Discharge: 2015-04-26 | Disposition: A | Discharge status: Home / Self Care | Payer: Medicare Other | Source: Ambulatory Visit | Attending: Radiation Oncology | Admitting: Radiation Oncology

## 2015-04-26 VITALS — BP 153/60 | HR 63 | Temp 98.2°F | Ht 65.0 in | Wt 210.4 lb

## 2015-04-26 DIAGNOSIS — C50211 Malignant neoplasm of upper-inner quadrant of right female breast: Secondary | ICD-10-CM

## 2015-04-26 DIAGNOSIS — Z510 Encounter for antineoplastic radiation therapy: Secondary | ICD-10-CM | POA: Diagnosis not present

## 2015-04-26 NOTE — Progress Notes (Addendum)
Jennifer Bartlett has completed 14 fractions to her right breast.  She denies pain.  She reports fatigue towards the end of the week.  She has redness around the incision to take out her lymph nodes on her right breast.  She is taking Bactrim DS for an infection in this area.  She also has redness extending under her right arm and under her right breast.  She reports her right breast is itching under right breast and around her lumpectomy incision.  She is using radiaplex.    BP 153/60 mmHg  Pulse 63  Temp(Src) 98.2 F (36.8 C) (Oral)  Ht 5\' 5"  (1.651 m)  Wt 210 lb 6.4 oz (95.437 kg)  BMI 35.01 kg/m2  LMP 06/08/1989

## 2015-04-26 NOTE — Progress Notes (Signed)
   Weekly Management Note:  Outpatient    ICD-9-CM ICD-10-CM   1. Breast cancer of upper-inner quadrant of right female breast 174.2 C50.211     Current Dose:  25.2 Gy  Projected Dose: 48.6 Gy  initial  Narrative:  The patient presents for routine under treatment assessment.  CBCT/MVCT images/Port film x-rays were reviewed.  The chart was checked. Skin improved with antibiotics. A little itching at IM fold  Physical Findings:  height is 5\' 5"  (1.651 m) and weight is 210 lb 6.4 oz (95.437 kg). Her oral temperature is 98.2 F (36.8 C). Her blood pressure is 153/60 and her pulse is 63.   Wt Readings from Last 3 Encounters:  04/26/15 210 lb 6.4 oz (95.437 kg)  04/19/15 213 lb (96.616 kg)  04/12/15 209 lb 3.2 oz (94.892 kg)   erythematous at right axillary scar and IM fold, skin is somewhat dry  Impression:  The patient is tolerating radiotherapy.  Plan:  Continue radiotherapy as planned.  hydrocortisone 1% cream at IM fold where she is itching  ________________________________   Eppie Gibson, M.D.

## 2015-04-27 ENCOUNTER — Telehealth: Payer: Self-pay | Admitting: Oncology

## 2015-04-27 ENCOUNTER — Ambulatory Visit
Admission: RE | Admit: 2015-04-27 | Discharge: 2015-04-27 | Disposition: A | Discharge status: Home / Self Care | Payer: Medicare Other | Source: Ambulatory Visit | Attending: Radiation Oncology | Admitting: Radiation Oncology

## 2015-04-27 DIAGNOSIS — Z510 Encounter for antineoplastic radiation therapy: Secondary | ICD-10-CM | POA: Diagnosis not present

## 2015-04-27 NOTE — Telephone Encounter (Signed)
No voicemail set up. Mailed calendar with changed appointment from 10/28 to 11/11 per MD off 10/28.

## 2015-04-28 ENCOUNTER — Ambulatory Visit
Admission: RE | Admit: 2015-04-28 | Discharge: 2015-04-28 | Disposition: A | Discharge status: Home / Self Care | Payer: Medicare Other | Source: Ambulatory Visit | Attending: Radiation Oncology | Admitting: Radiation Oncology

## 2015-04-28 DIAGNOSIS — Z510 Encounter for antineoplastic radiation therapy: Secondary | ICD-10-CM | POA: Diagnosis not present

## 2015-04-29 ENCOUNTER — Ambulatory Visit
Admission: RE | Admit: 2015-04-29 | Discharge: 2015-04-29 | Disposition: A | Discharge status: Home / Self Care | Payer: Medicare Other | Source: Ambulatory Visit | Attending: Radiation Oncology | Admitting: Radiation Oncology

## 2015-04-29 DIAGNOSIS — Z510 Encounter for antineoplastic radiation therapy: Secondary | ICD-10-CM | POA: Diagnosis not present

## 2015-04-30 ENCOUNTER — Ambulatory Visit
Admission: RE | Admit: 2015-04-30 | Discharge: 2015-04-30 | Disposition: A | Discharge status: Home / Self Care | Payer: Medicare Other | Source: Ambulatory Visit | Attending: Radiation Oncology | Admitting: Radiation Oncology

## 2015-04-30 ENCOUNTER — Telehealth: Payer: Self-pay

## 2015-04-30 DIAGNOSIS — Z510 Encounter for antineoplastic radiation therapy: Secondary | ICD-10-CM | POA: Diagnosis not present

## 2015-04-30 NOTE — Telephone Encounter (Signed)
Pt notified of Dr. Marliss Coots instructions and verbalized understanding, pt has had chicken pox and shingles before

## 2015-04-30 NOTE — Telephone Encounter (Signed)
Shingles shot can only be given once  If she has not yet had chicken pox -she is at risk for that (let me know if that is the case) Wash hands frequently and sanitize everything in the house the best she can

## 2015-04-30 NOTE — Telephone Encounter (Signed)
Pt is presently taking radiation treatment for breast CA; pts grandchild has chicken pox that lives with pt and pt wants to know if needs to get a second shingles vaccine. Advised pt only one shingles vaccine is usually recommended but will send note to Dr Glori Bickers for confirmation. Pt request cb; next week will be OK. (pt has not actually seen oncologist yet).

## 2015-05-03 ENCOUNTER — Other Ambulatory Visit: Payer: Self-pay | Admitting: Family Medicine

## 2015-05-03 ENCOUNTER — Ambulatory Visit
Admission: RE | Admit: 2015-05-03 | Discharge: 2015-05-03 | Disposition: A | Discharge status: Home / Self Care | Payer: Medicare Other | Source: Ambulatory Visit | Attending: Radiation Oncology | Admitting: Radiation Oncology

## 2015-05-03 ENCOUNTER — Ambulatory Visit
Admission: RE | Admit: 2015-05-03 | Discharge: 2015-05-03 | Disposition: A | Discharge status: Home / Self Care | Payer: 59 | Source: Ambulatory Visit | Attending: Radiation Oncology | Admitting: Radiation Oncology

## 2015-05-03 ENCOUNTER — Encounter: Payer: Self-pay | Admitting: Radiation Oncology

## 2015-05-03 VITALS — BP 163/80 | HR 71 | Temp 98.0°F | Resp 20 | Wt 211.0 lb

## 2015-05-03 DIAGNOSIS — Z510 Encounter for antineoplastic radiation therapy: Secondary | ICD-10-CM | POA: Diagnosis not present

## 2015-05-03 DIAGNOSIS — C50211 Malignant neoplasm of upper-inner quadrant of right female breast: Secondary | ICD-10-CM

## 2015-05-03 NOTE — Progress Notes (Signed)
CC: Dr. Stark Klein   Weekly Management Note:  Site: Right breast Current Dose:  3420  cGy Projected Dose: 4860  cGy  Narrative: The patient is seen today for routine under treatment assessment. CBCT/MVCT images/port films were reviewed. The chart was reviewed.   Her right sentinel lymph node biopsy wound is much improved.  She tells me that she improved quickly on sulfamethoxazole/trimethoprim.  She does report fatigue along with pruritus along her right breast.  She has been using Radioplex gel.  Physical Examination:  Filed Vitals:   05/03/15 0833  BP: 163/80  Pulse: 71  Temp: 98 F (36.7 C)  Resp: 20  .  Weight: 211 lb (95.709 kg).  There is generalized erythema along the right breast with dry desquamation along the inframammary fold.  The axillary sentinel lymph node biopsy site is erythematous but there is no mass effect or evidence for infection.  Impression: Tolerating radiation therapy well.  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  Her right sentinel lymph node biopsy wound infection has cleared.  Plan: Continue radiation therapy as planned.

## 2015-05-03 NOTE — Progress Notes (Signed)
Weekly rad tx right breast 19/27 completed, raw and dry desquamation on breast and under inframmary fold, using cortisone cream for the itching, radaipelx bid, burns at times, appetite good, energy level none stated, takes nap after treatment and in afternoon also 8:35 AM BP 163/80 mmHg  Pulse 71  Temp(Src) 98 F (36.7 C) (Oral)  Resp 20  Wt 211 lb (95.709 kg)  LMP 06/08/1989  Wt Readings from Last 3 Encounters:  05/03/15 211 lb (95.709 kg)  04/26/15 210 lb 6.4 oz (95.437 kg)  04/19/15 213 lb (96.616 kg)

## 2015-05-04 ENCOUNTER — Ambulatory Visit
Admission: RE | Admit: 2015-05-04 | Discharge: 2015-05-04 | Disposition: A | Discharge status: Home / Self Care | Payer: Medicare Other | Source: Ambulatory Visit | Attending: Radiation Oncology | Admitting: Radiation Oncology

## 2015-05-04 DIAGNOSIS — Z510 Encounter for antineoplastic radiation therapy: Secondary | ICD-10-CM | POA: Diagnosis not present

## 2015-05-05 ENCOUNTER — Ambulatory Visit
Admission: RE | Admit: 2015-05-05 | Discharge: 2015-05-05 | Disposition: A | Discharge status: Home / Self Care | Payer: Medicare Other | Source: Ambulatory Visit | Attending: Radiation Oncology | Admitting: Radiation Oncology

## 2015-05-05 DIAGNOSIS — Z510 Encounter for antineoplastic radiation therapy: Secondary | ICD-10-CM | POA: Diagnosis not present

## 2015-05-06 ENCOUNTER — Ambulatory Visit
Admission: RE | Admit: 2015-05-06 | Discharge: 2015-05-06 | Disposition: A | Discharge status: Home / Self Care | Payer: Medicare Other | Source: Ambulatory Visit | Attending: Radiation Oncology | Admitting: Radiation Oncology

## 2015-05-06 DIAGNOSIS — Z510 Encounter for antineoplastic radiation therapy: Secondary | ICD-10-CM | POA: Diagnosis not present

## 2015-05-07 ENCOUNTER — Ambulatory Visit
Admission: RE | Admit: 2015-05-07 | Discharge: 2015-05-07 | Disposition: A | Discharge status: Home / Self Care | Payer: Medicare Other | Source: Ambulatory Visit | Attending: Radiation Oncology | Admitting: Radiation Oncology

## 2015-05-07 ENCOUNTER — Other Ambulatory Visit: Payer: Self-pay | Admitting: Family Medicine

## 2015-05-07 DIAGNOSIS — Z510 Encounter for antineoplastic radiation therapy: Secondary | ICD-10-CM | POA: Diagnosis not present

## 2015-05-07 NOTE — Telephone Encounter (Signed)
Pt request status of HCTZ refill. Spoke with Kayla at [facility] and refill on hold; Lonn Georgia will get ready for pick up. Pt voiced understanding and will pick up at pharmacy.

## 2015-05-10 ENCOUNTER — Ambulatory Visit
Admission: RE | Admit: 2015-05-10 | Discharge: 2015-05-10 | Disposition: A | Discharge status: Home / Self Care | Payer: Medicare Other | Source: Ambulatory Visit | Attending: Radiation Oncology | Admitting: Radiation Oncology

## 2015-05-10 ENCOUNTER — Ambulatory Visit
Admission: RE | Admit: 2015-05-10 | Discharge: 2015-05-10 | Disposition: A | Discharge status: Home / Self Care | Payer: 59 | Source: Ambulatory Visit | Attending: Radiation Oncology | Admitting: Radiation Oncology

## 2015-05-10 VITALS — BP 158/73 | HR 67 | Temp 97.6°F | Resp 20 | Wt 212.8 lb

## 2015-05-10 DIAGNOSIS — C50211 Malignant neoplasm of upper-inner quadrant of right female breast: Secondary | ICD-10-CM

## 2015-05-10 DIAGNOSIS — Z510 Encounter for antineoplastic radiation therapy: Secondary | ICD-10-CM | POA: Diagnosis not present

## 2015-05-10 NOTE — Progress Notes (Addendum)
Weekly rad txs right breast 24/27 completed, erythema dry desq under  and nipple area, , using neosporin,  axilla and under inframmary fold, healing there, no more c/o hurting there,  Appetite good, drinks plenty water energy level still poor Gets fatigued 30 min after getting home from rad tx BP 158/73 mmHg  Pulse 67  Temp(Src) 97.6 F (36.4 C) (Oral)  Resp 20  Wt 212 lb 12.8 oz (96.525 kg)  LMP 06/08/1989  Wt Readings from Last 3 Encounters:  05/10/15 212 lb 12.8 oz (96.525 kg)  05/03/15 211 lb (95.709 kg)  04/26/15 210 lb 6.4 oz (95.437 kg)   8:34 AM

## 2015-05-10 NOTE — Progress Notes (Signed)
Weekly Management Note:  Site: Right breast Current Dose:  4320  cGy Projected Dose: 4860  cGy  Narrative: The patient is seen today for routine under treatment assessment. CBCT/MVCT images/port films were reviewed. The chart was reviewed.   She is having less discomfort along the inframammary region after using Neosporin.  She is now using Biafine cream.  Physical Examination:  Filed Vitals:   05/10/15 0830  BP: 158/73  Pulse: 67  Temp: 97.6 F (36.4 C)  Resp: 20  .  Weight: 212 lb 12.8 oz (96.525 kg).  There is dry desquamation along the inframammary region and lower axilla as expected.  No areas of moist desquamation.  Impression: Tolerating radiation therapy well.  She will finish her treatment this Thursday and I will check her skin at that time.  Plan: Continue radiation therapy as planned.

## 2015-05-11 ENCOUNTER — Ambulatory Visit
Admission: RE | Admit: 2015-05-11 | Discharge: 2015-05-11 | Disposition: A | Discharge status: Home / Self Care | Payer: Medicare Other | Source: Ambulatory Visit | Attending: Radiation Oncology | Admitting: Radiation Oncology

## 2015-05-11 DIAGNOSIS — Z510 Encounter for antineoplastic radiation therapy: Secondary | ICD-10-CM | POA: Diagnosis not present

## 2015-05-12 ENCOUNTER — Ambulatory Visit
Admission: RE | Admit: 2015-05-12 | Discharge: 2015-05-12 | Disposition: A | Discharge status: Home / Self Care | Payer: Medicare Other | Source: Ambulatory Visit | Attending: Radiation Oncology | Admitting: Radiation Oncology

## 2015-05-12 DIAGNOSIS — Z510 Encounter for antineoplastic radiation therapy: Secondary | ICD-10-CM | POA: Diagnosis not present

## 2015-05-13 ENCOUNTER — Telehealth: Payer: Self-pay | Admitting: *Deleted

## 2015-05-13 ENCOUNTER — Ambulatory Visit
Admission: RE | Admit: 2015-05-13 | Discharge: 2015-05-13 | Disposition: A | Discharge status: Home / Self Care | Payer: Medicare Other | Source: Ambulatory Visit | Attending: Radiation Oncology | Admitting: Radiation Oncology

## 2015-05-13 ENCOUNTER — Encounter: Payer: Self-pay | Admitting: Radiation Oncology

## 2015-05-13 DIAGNOSIS — Z510 Encounter for antineoplastic radiation therapy: Secondary | ICD-10-CM | POA: Diagnosis not present

## 2015-05-13 DIAGNOSIS — C50211 Malignant neoplasm of upper-inner quadrant of right female breast: Secondary | ICD-10-CM

## 2015-05-13 NOTE — Progress Notes (Signed)
[facility] End of Treatment Note  Name:Jennifer Bartlett  Date: 05/13/2015 ZHG:[id number] DOB:11-27-1945   Status:outpatient    CC: Loura Pardon, MD  Dr. Stark Klein  REFERRING PHYSICIAN: Dr. Stark Klein   DIAGNOSIS: Clinical stage IA  (T1a N0 M0) invasive lobular carcinoma the right breast   INDICATION FOR TREATMENT: Curative   TREATMENT DATES: 04/07/2015 through 05/13/2015                           SITE/DOSE:  Right breast 4860 cGy in 27 sessions, no boost                          BEAMS/ENERGY:    Tangential fields to the right breast with 10 MV photons               NARRATIVE:   [name] tolerated treatment well although she developed a superficial abscess along her sentinel lymph node biopsy wound which was successfully treated with sulfamethoxazole/trimethoprim through Dr. Barry Dienes.  She developed hyperpigmentation the skin along with dry desquamation, particularly along the right inframammary region.  No areas of moist desquamation.                         PLAN: Routine followup in one month. Patient instructed to call if questions or worsening complaints in interim.

## 2015-05-13 NOTE — Telephone Encounter (Signed)
Left message for a return phone call to follow up post XRT.  Awaiting patient response.  Survivorship referral placed.

## 2015-05-13 NOTE — Progress Notes (Signed)
Weekly Management Note:  Site: Breast Current Dose:  4860  cGy Projected Dose: 4860  cGy  Narrative: The patient is seen today for routine under treatment assessment. CBCT/MVCT images/port films were reviewed. The chart was reviewed.   She is finished today.  She continues with Biafine cream.  Physical Examination: There were no vitals filed for this visit..  Weight:  .  There is marked hyperpigmentation the skin and patchy dry desquamation, particularly along the right inframammary region.  No areas of moist desquamation.  Impression: Radiation therapy completed.  Plan: Follow-up visit with me in one month.

## 2015-05-28 ENCOUNTER — Ambulatory Visit: Payer: Medicare Other | Admitting: Oncology

## 2015-06-11 ENCOUNTER — Ambulatory Visit: Payer: Medicare Other | Admitting: Oncology

## 2015-06-16 ENCOUNTER — Ambulatory Visit (INDEPENDENT_AMBULATORY_CARE_PROVIDER_SITE_OTHER): Payer: Medicare Other

## 2015-06-16 DIAGNOSIS — E538 Deficiency of other specified B group vitamins: Secondary | ICD-10-CM | POA: Diagnosis not present

## 2015-06-16 MED ORDER — CYANOCOBALAMIN 1000 MCG/ML IJ SOLN
1000.0000 ug | Freq: Once | INTRAMUSCULAR | Status: AC
  Administered 2015-06-16: 1000 ug via INTRAMUSCULAR

## 2015-06-22 ENCOUNTER — Other Ambulatory Visit: Payer: Self-pay | Admitting: Orthopedic Surgery

## 2015-06-22 DIAGNOSIS — M25562 Pain in left knee: Secondary | ICD-10-CM

## 2015-06-30 ENCOUNTER — Ambulatory Visit (INDEPENDENT_AMBULATORY_CARE_PROVIDER_SITE_OTHER): Payer: Medicare Other

## 2015-06-30 DIAGNOSIS — Z23 Encounter for immunization: Secondary | ICD-10-CM | POA: Diagnosis not present

## 2015-07-01 ENCOUNTER — Ambulatory Visit
Admission: RE | Admit: 2015-07-01 | Discharge: 2015-07-01 | Disposition: A | Discharge status: Home / Self Care | Payer: Medicare Other | Source: Ambulatory Visit | Attending: Orthopedic Surgery | Admitting: Orthopedic Surgery

## 2015-07-01 DIAGNOSIS — M25562 Pain in left knee: Secondary | ICD-10-CM

## 2015-07-15 ENCOUNTER — Ambulatory Visit (HOSPITAL_BASED_OUTPATIENT_CLINIC_OR_DEPARTMENT_OTHER): Payer: Medicare Other | Admitting: Nurse Practitioner

## 2015-07-15 ENCOUNTER — Encounter: Payer: Self-pay | Admitting: Nurse Practitioner

## 2015-07-15 ENCOUNTER — Telehealth: Payer: Self-pay | Admitting: Internal Medicine

## 2015-07-15 VITALS — BP 165/75 | HR 75 | Temp 97.9°F | Resp 16 | Ht 65.0 in | Wt 212.4 lb

## 2015-07-15 DIAGNOSIS — C50211 Malignant neoplasm of upper-inner quadrant of right female breast: Secondary | ICD-10-CM

## 2015-07-15 DIAGNOSIS — Z170 Estrogen receptor positive status [ER+]: Secondary | ICD-10-CM

## 2015-07-15 NOTE — Patient Instructions (Signed)
Thank you for coming in today!  We will get you scheduled to see Dr. Jana Hakim ASAP to discuss anti-estrogen medication.  In the meantime, let me know if you have any questions!

## 2015-07-15 NOTE — Telephone Encounter (Signed)
Gave and printed appts ched and avs for pt for DEC  °

## 2015-07-15 NOTE — Progress Notes (Signed)
CLINIC:  Cancer Survivorship   REASON FOR VISIT:  Routine follow-up post-treatment for a recent history of breast cancer.  BRIEF ONCOLOGIC HISTORY:    Breast cancer of upper-inner quadrant of right female breast ([place])   02/05/2015 Mammogram Right breast: possible mass warranting further evaluation   02/17/2015 Breast US Right breast: Targeted ultrasound; 1 o'clock 10 cm from the nipple irregular 0.5 x 0.5 x 0.4 cm nodule, taller than wide, with associated dense posterior acoustic shadowing. No evidence of right axillary lymphadenopathy.   02/24/2015 Initial Biopsy Right breast core needle biopsy: Invasive mammary carcinoma, lobular neoplasia (atypical lobular hyperplasia); ER+ (95%), PR+ (60%), HER2/neu negative, Ki67 3%   02/24/2015 Clinical Stage Stage IA: T1a N0   03/09/2015 Definitive Surgery Right lumpectomy/SLNB ([place]): Invasive lobular carcinoma, grade 2, 1.0 cm, LCIS, negative margins, HER2/neu repeated (ratio 1.56). 5 LN removed and negative for malignancy (0/5)   03/09/2015 Pathologic Stage Stage IA: pT1b pN0   04/08/2015 - 05/13/2015 Radiation Therapy Adjuvant RT Valere Dross): Right breast: 48.6 Gy over 27 fractions    Anti-estrogen oral therapy To discuss with Dr. Jana Hakim upon completion of radiation    INTERVAL HISTORY:  Ms. Boffa presents to the [facility] today for our initial meeting to review her survivorship care plan detailing her treatment course for breast cancer, as well as monitoring long-term side effects of that treatment, education regarding health maintenance, screening, and overall wellness and health promotion.     Overall, Ms. Gloster reports feeling quite well since completing her radiation therapy approximately two months ago.  She continues with fatigue but reports that the skin changes overlying her right breast have improved.  She is drinking a lot of water stating that she is thirsty more often / mouth is dry. She denies polydipsia and has had no change  in her weight.  She has not started any new medication. She denies any headache or shortness of breath.  She has generalized aches and pains which predate her cancer diagnosis.  She has not yet begun any endocrine therapy and does not currently have a follow up appointment with Dr. Jana Hakim.  At baseline, she has increased hot flashes and has had some difficulty sleeping. She has tried melatonin without relief.  She denies any change along her breast. She reports a good appetite and denies any weight loss.    REVIEW OF SYSTEMS:  General: Fatigue, hot flashes as above. Drinking increased amounts of water (no polydipsia). Denies fever, chills, or unintentional weight loss. HEENT: Denies visual changes, hearing loss, mouth sores or difficulty swallowing. Cardiac: Denies palpitations, chest pain, and lower extremity edema.  Respiratory: Continued cough after recent URI. Denies wheeze or dyspnea on exertion.  Breast: Denies any new nodularity, masses, tenderness, nipple changes, or nipple discharge.  GI: Denies abdominal pain, constipation, diarrhea, nausea, or vomiting.  GU: Denies dysuria, hematuria, vaginal bleeding, vaginal discharge, or vaginal dryness.  Musculoskeletal: Occasional joint and bone pain that predated cancer diagnosis. Neuro: Denies recent fall or numbness / tingling in her extremities. Skin: Denies rash, pruritis, or open wounds.  Psych: More quick to anger than normal. Insomnia. Denies depression, anxiety, or memory loss.   A 14-point review of systems was completed and was negative, except as noted above.   ONCOLOGY TREATMENT TEAM:  1. Surgeon:  Dr. Barry Dienes at [facility]  2. Medical Oncologist: Dr. Jana Hakim 3. Radiation Oncologist: Dr. Valere Dross    PAST MEDICAL/SURGICAL HISTORY:  Past Medical History  Diagnosis Date  . GERD (gastroesophageal reflux  disease)   . Hyperlipidemia   . Obesity   . Vitamin B 12 deficiency   . Hypoglycemia   . Hypertension   .  Anemia   . Breast cancer of upper-inner quadrant of right female breast ([place]) 02/26/2015  . Hot flashes   . Anxiety    Past Surgical History  Procedure Laterality Date  . Partial hysterectomy  1990's    bleeding  . Knee surgery      left  . Gastric bypass  08/2003  . Hernia repair    . Abdominal hysterectomy    . Tubal ligation    . Radioactive seed guided mastectomy with axillary sentinel lymph node biopsy Right 03/09/2015    Procedure: RADIOACTIVE SEED GUIDED RIGHT PARTIAL MASTECTOMY WITH AXILLARY SENTINEL LYMPH NODE BIOPSY;  Surgeon: Stark Klein, MD;  Location: [facility];  Service: General;  Laterality: Right;     ALLERGIES:  Allergies  Allergen Reactions  . Crestor [Rosuvastatin Calcium] Other (See Comments)    Myalgia   . Lipitor [Atorvastatin Calcium] Other (See Comments)    Aches and pains  . Penicillins Shortness Of Breath and Swelling  . Zocor [Simvastatin - High Dose] Other (See Comments)    Muscle/joint aches     CURRENT MEDICATIONS:  Current Outpatient Prescriptions on File Prior to Visit  Medication Sig Dispense Refill  . ALPRAZolam (XANAX) 0.5 MG tablet Take 1 tablet (0.5 mg total) by mouth daily as needed for anxiety. 30 tablet 0  . BIOTIN PO Take 1 tablet by mouth daily.    Marland Kitchen CALCIUM PO Take by mouth daily.      . cholecalciferol (VITAMIN D) 400 UNITS TABS Take 400 Units by mouth daily.     . cyanocobalamin (,VITAMIN B-12,) 1000 MCG/ML injection Inject 1,000 mcg into the muscle once. Every 2 months     . hydrochlorothiazide (HYDRODIURIL) 25 MG tablet Take 1 tablet (25 mg total) by mouth daily. 90 tablet 3  . Multiple Vitamin (MULTIVITAMIN) capsule Take 1 capsule by mouth daily.      Marland Kitchen omeprazole (PRILOSEC) 20 MG capsule Take 1 capsule (20 mg total) by mouth daily. 90 capsule 3  . oxyCODONE-acetaminophen (ROXICET) 5-325 MG per tablet Take 1-2 tablets by mouth every 4 (four) hours as needed for severe pain. (Patient not taking: Reported on  07/15/2015) 30 tablet 0   No current facility-administered medications on file prior to visit.     ONCOLOGIC FAMILY HISTORY:  Family History  Problem Relation Age of Onset  . Heart disease Mother     MI  . Hypertension Mother   . Alcohol abuse Father   . Heart disease Father     smoker  . Hypertension Father   . Breast cancer Paternal Grandmother   . Diabetes Maternal Aunt      GENETIC COUNSELING/TESTING: No    SOCIAL HISTORY:  AANIYA STERBA is married and lives with her family (including her daughter and grandchildren) in [place], [place].  She has 2 children and 4 grandchildren. Ms. Crosby is currently retired after having worked at [employer].  She denies any current or history of tobacco, alcohol, or illicit drug use.     PHYSICAL EXAMINATION:  Vital Signs: Filed Vitals:   07/15/15 1005  BP: 165/75  Pulse: 75  Temp: 97.9 F (36.6 C)  Resp: 16   ECOG Performance Status: 0  General: Well-nourished, well-appearing female in no acute distress.  She is accompanied in clinic by her husband today.  HEENT: Head is atraumatic and normocephalic.  Pupils equal and reactive to light and accomodation. Conjunctivae clear without exudate.  Sclerae anicteric. Oral mucosa is pink, moist, and intact without lesions.  Oropharynx is pink without lesions or erythema.  Lymph: No cervical, supraclavicular, infraclavicular, or axillary lymphadenopathy noted on palpation.  Cardiovascular: Regular rate and rhythm without murmurs, rubs, or gallops. Respiratory: Clear to auscultation bilaterally. Chest expansion symmetric without accessory muscle use on inspiration or expiration.  GI: Abdomen soft and round. No tenderness to palpation. Bowel sounds normoactive in 4 quadrants. GU: Deferred.  Musculoskeletal: Muscle strength 5/5 in all extremities.   Neuro: No focal deficits. Steady gait.  Psych: Mood and affect normal and appropriate for situation.  Extremities: No  edema, cyanosis, or clubbing.  Skin: Warm and dry. No open lesions noted.   LABORATORY DATA:  None for this visit.  DIAGNOSTIC IMAGING:  None for this visit.     ASSESSMENT AND PLAN:   1. Breast cancer: Stage IA invasive lobular carcinoma of the right breast, grade 2, ER positive, PR positive, HER2/neu negative, S/P lumpectomy/SLNB followed by adjuvant radiation therapy to the right breast.  Ms. Howser is doing well without clinical symptoms worrisome for disease recurrence. She does not have a follow-up with her medical oncologist, Dr. Jana Hakim, at this time but we will facilitate scheduling of that appointment to discuss initiation of endocrine therapy now that she has completed her radiation therapy. I have reviewed the indications and common side effects with Ms. Leppert and her husband, and answered their questions. A comprehensive survivorship care plan and treatment summary was reviewed with the patient today detailing her breast cancer diagnosis, treatment course, potential late/long-term effects of treatment, appropriate follow-up care with recommendations for the future, and patient education resources.  A copy of this summary, along with a letter will be sent to the patient's primary care provider via in basket message after today's visit.  Ms. Quimby is welcome to return to the Survivorship Clinic in the future, as needed; no follow-up will be scheduled at this time.    2. Bone health:  Given Ms. Neitzke's age/history of breast cancer and plans for endocrine therapy with anastrozole, she is at risk for bone demineralization.  She reports that she has had bone densiometry testing in the past, although not recently.  I have encouraged her to discuss this further with Dr. Jana Hakim and shared that we will need to obtain baseline studies as she begins endocrine therapy.  She was encouraged to increase her consumption of foods rich in calcium and vitamin D (continuing her vitamin D oral  supplement) as well as to increase her weight-bearing activities.  She was given education on specific activities to promote bone health.  3. Cancer screening:  Due to Ms. Kazee's history and her age, she should receive screening for skin cancers and colon cancer. She is S/P hysterectomy. The information and recommendations are listed on the patient's comprehensive care plan/treatment summary and were reviewed in detail with the patient.    4. Health maintenance and wellness promotion: Ms. Subia was encouraged to consume 5-7 servings of fruits and vegetables per day. We reviewed the "Nutrition Rainbow" handout, as well as discussed recommendations to maximize nutrition and minimize recurrence, such as increased intake of fruits, vegetables, lean proteins, and minimizing the intake of red meats and processed foods.  She was also encouraged to engage in moderate to vigorous exercise for 30 minutes per day most days of the week. We discussed the [employer]  [employer] fitness program, which is designed for cancer survivors to help them become more physically fit after cancer treatments.  She was instructed to continue to abstain from tobacco and alcohol use.  A copy of the "Take Control of Your Health" brochure was given to her reinforcing these recommendations.  We have discussed ways to maximize her sleep including setting a regular sleep / wake routine, avoiding caffeine late in the day, beginning regular exercise, and avoiding naps. I am hesitant to recommend benadryl due to her dry mouth.     5. Support services/counseling: It is not uncommon for this period of the patient's cancer care trajectory to be one of many emotions and stressors.  We discussed an opportunity for her to participate in the next session of [facility] ("Finding Your New Normal") support group series designed for patients after they have completed treatment.   Ms. Longo was encouraged to take advantage of our many other support services  programs, support groups, and/or counseling in coping with her new life as a cancer survivor after completing anti-cancer treatment.  She was offered support today through active listening and expressive supportive counseling.  She was given information regarding our available services and encouraged to contact me with any questions or for help enrolling in any of our support group/programs.    A total of 50 minutes of face-to-face time was spent with this patient with greater than 50% of that time in counseling and care-coordination.   Sylvan Cheese, NP  Survivorship Program [facility] [phone]   Note: PRIMARY CARE PROVIDER [place], [place] [phone]

## 2015-07-21 ENCOUNTER — Telehealth: Payer: Self-pay | Admitting: Oncology

## 2015-07-21 ENCOUNTER — Ambulatory Visit (HOSPITAL_BASED_OUTPATIENT_CLINIC_OR_DEPARTMENT_OTHER): Payer: Medicare Other | Admitting: Oncology

## 2015-07-21 VITALS — BP 173/52 | HR 70 | Temp 97.6°F | Resp 18 | Ht 65.0 in | Wt 211.2 lb

## 2015-07-21 DIAGNOSIS — C50211 Malignant neoplasm of upper-inner quadrant of right female breast: Secondary | ICD-10-CM | POA: Diagnosis not present

## 2015-07-21 DIAGNOSIS — Z170 Estrogen receptor positive status [ER+]: Secondary | ICD-10-CM

## 2015-07-21 MED ORDER — VENLAFAXINE HCL ER 37.5 MG PO CP24: 37.5000 mg | ORAL_CAPSULE | Freq: Every day | ORAL | Status: DC

## 2015-07-21 MED ORDER — TAMOXIFEN CITRATE 20 MG PO TABS: 20.0000 mg | ORAL_TABLET | Freq: Every day | ORAL | Status: AC

## 2015-07-21 NOTE — Progress Notes (Signed)
[place]  Telephone:([phone]) [phone] Fax:(336) [phone]     ID: Jennifer Bartlett DOB: 1946/05/04  MR#: [id number]  ERX#:[id number]  Patient Care Team: Abner Greenspan, MD as PCP - General Huel Cote, NP as Nurse Practitioner (Obstetrics and Gynecology) Stark Klein, MD as Consulting Physician (General Surgery) Chauncey Cruel, MD as Consulting Physician (Oncology) Arloa Koh, MD as Consulting Physician (Radiation Oncology) Mauro Kaufmann, RN as Registered Nurse Rockwell Germany, RN as Registered Nurse Sylvan Cheese, NP as Nurse Practitioner (Nurse Practitioner) PCP: Loura Pardon, MD GYN: SU:  OTHER MD:  CHIEF COMPLAINT: Estrogen receptor positive breast cancer  CURRENT TREATMENT: .   BREAST CANCER HISTORY:  From the original intake note:  Shaylyn underwent bilateral screening [facility]. This showed a possible mass in the right breast. She was recalled for diagnostic right mammography with tomosynthesis and right breast ultrasonography 02/17/2015. This found the breast density to be category C. In the right breast upper inner quadrant there was a persistent poorly marginated nodule which was not palpable. By ultrasound this measured 0.5 cm. There was no evidence of right axillary lymphadenopathy.  Biopsy of this mass 02/24/2015 showed ([id number]) an invasive lobular breast cancer, E-cadherin negative, grade 1, estrogen receptor 95% positive, progesterone receptor 60% positive, both with strong staining intensity, with an MIB-1 of 3%, and no HER-2 amplification, the signals ratio being 1.29 and the number per cell 2.00.  Her subsequent history is as detailed below  INTERVAL HISTORY: Vivuian returns today for follow-up of her estrogen receptor positive breast cancer accompanied by her husband Jeneen Rinks. She had an appointment here in November but for some reason that fell through. Since her last visit here she  had her surgery, which confirmed a 1 cm invasive lobular breast cancer, grade 2, again HER-2 negative. She then proceeded to radiation which was completed in October.    she did well with her surgery except for an abscess in the right axilla, which took some time to resolve. After that she went onto radiation and she did have some fatigue which persists. Her skin however held up really well.  REVIEW OF SYSTEMS:  she is currently not exercising regularly but she is active with her grandchildren, who live in the same house with her and her daughter and her daughter's husband. She has occasional headaches, associated with hot flashes which tend to occur mostly in the evening. They do not wake her up at night. Aside from these issues a detailed review of systems today was noncontributory  PAST MEDICAL HISTORY: Past Medical History  Diagnosis Date  . GERD (gastroesophageal reflux disease)   . Hyperlipidemia   . Obesity   . Vitamin B 12 deficiency   . Hypoglycemia   . Hypertension   . Anemia   . Breast cancer of upper-inner quadrant of right female breast ([place]) 02/26/2015  . Hot flashes   . Anxiety     PAST SURGICAL HISTORY: Past Surgical History  Procedure Laterality Date  . Partial hysterectomy  1990's    bleeding  . Knee surgery      left  . Gastric bypass  08/2003  . Hernia repair    . Abdominal hysterectomy    . Tubal ligation    . Radioactive seed guided mastectomy with axillary sentinel lymph node biopsy Right 03/09/2015    Procedure: RADIOACTIVE SEED GUIDED RIGHT PARTIAL MASTECTOMY WITH AXILLARY SENTINEL LYMPH NODE BIOPSY;  Surgeon: Stark Klein,  MD;  Location: [facility];  Service: General;  Laterality: Right;    FAMILY HISTORY Family History  Problem Relation Age of Onset  . Heart disease Mother     MI  . Hypertension Mother   . Alcohol abuse Father   . Heart disease Father     smoker  . Hypertension Father   . Breast cancer Paternal Grandmother   .  Diabetes Maternal Aunt    the patient's father died at the age of 43 and her mother at the age of 42, both from heart attacks in the setting of tobacco abuse. The patient has 3 brothers, one sister. The patient's paternal grandmother may have been diagnosed with breast cancer in her late 45s, but that is not certain. There is no other history of breast or ovarian cancer in the family to the patient's knowledge.  GYNECOLOGIC HISTORY:  Patient's last menstrual period was 06/08/1989. Menarche age 69, first live birth age 60; the patient is [place] P2. She underwent hysterectomy without salpingo-oophorectomy more than 20 years ago. She has been on hormone replacement more than 20 years. She understands this does increase the risk of developing breast cancer. This was stopped July 2016.  SOCIAL HISTORY:  Sina used to work for [employer] but is now retired. Her husband Shawnie Nicole (goes by Jeneen Rinks) worked 86 years for [employer]. The patient's daughter Romero Belling lives in [place] and is Mudlogger of the afterschool program in [place]. The patient's son Jeneen Rinks lives in [place] and works for [employer], a company that cleans up after disaster or certain crimes. The patient also has an adopted daughter with knee Maricela Bo who lives in [place] and is a homemaker. The patient has 2 blood grandchildren and 2 adopted grandchildren. At home its the patient and her husband, her daughter and her husband and 2 grandchildren. Danny attends a local [employer] DIRECTIVES: Not in place   HEALTH MAINTENANCE: Social History  Substance Use Topics  . Smoking status: Never Smoker   . Smokeless tobacco: Never Used  . Alcohol Use: No     Colonoscopy: Never (status post bypass surgery)  PAP: 2015  Bone density: c. 2012; normal  Lipid panel:  Allergies  Allergen Reactions  . Crestor [Rosuvastatin Calcium] Other (See Comments)    Myalgia   . Lipitor [Atorvastatin  Calcium] Other (See Comments)    Aches and pains  . Penicillins Shortness Of Breath and Swelling  . Zocor [Simvastatin - High Dose] Other (See Comments)    Muscle/joint aches    Current Outpatient Prescriptions  Medication Sig Dispense Refill  . ALPRAZolam (XANAX) 0.5 MG tablet Take 1 tablet (0.5 mg total) by mouth daily as needed for anxiety. 30 tablet 0  . BIOTIN PO Take 1 tablet by mouth daily.    Marland Kitchen CALCIUM PO Take by mouth daily.      . cholecalciferol (VITAMIN D) 400 UNITS TABS Take 400 Units by mouth daily.     . cyanocobalamin (,VITAMIN B-12,) 1000 MCG/ML injection Inject 1,000 mcg into the muscle once. Every 2 months     . hydrochlorothiazide (HYDRODIURIL) 25 MG tablet Take 1 tablet (25 mg total) by mouth daily. 90 tablet 3  . Multiple Vitamin (MULTIVITAMIN) capsule Take 1 capsule by mouth daily.      Marland Kitchen omeprazole (PRILOSEC) 20 MG capsule Take 1 capsule (20 mg total) by mouth daily. 90 capsule 3  . oxyCODONE-acetaminophen (ROXICET) 5-325 MG per tablet Take 1-2  tablets by mouth every 4 (four) hours as needed for severe pain. (Patient not taking: Reported on 07/15/2015) 30 tablet 0   No current facility-administered medications for this visit.    OBJECTIVE: Middle-aged white woman who appears stated age 69 Vitals:   07/21/15 1011  BP: 173/52  Pulse: 70  Temp: 97.6 F (36.4 C)  Resp: 18     Body mass index is 35.15 kg/(m^2).    ECOG FS:1 - Symptomatic but completely ambulatory  Sclerae unicteric, pupils round and equal Oropharynx clear and moist-- no thrush or other lesions No cervical or supraclavicular adenopathy Lungs no rales or rhonchi Heart regular rate and rhythm Abd soft, nontender, positive bowel sounds MSK no focal spinal tenderness, no upper extremity lymphedema Neuro: nonfocal, well oriented, appropriate affect Breasts:  The right breast is status post lumpectomy and radiation. There is minimal hyperpigmentation. There is no evidence of residual or  recurrent disease. The right axilla is benign. The left breast is unremarkable    LAB RESULTS:  CMP     Component Value Date/Time   NA 138 03/03/2015 0756   NA 138 08/17/2014 0838   K 3.6 03/03/2015 0756   K 3.8 08/17/2014 0838   CL 101 08/17/2014 0838   CO2 24 03/03/2015 0756   CO2 29 08/17/2014 0838   GLUCOSE 138 03/03/2015 0756   GLUCOSE 114* 08/17/2014 0838   BUN 17.8 03/03/2015 0756   BUN 17 08/17/2014 0838   CREATININE 0.9 03/03/2015 0756   CREATININE 0.82 08/17/2014 0838   CALCIUM 8.8 03/03/2015 0756   CALCIUM 8.8 08/17/2014 0838   PROT 7.1 03/03/2015 0756   PROT 7.0 08/17/2014 0838   ALBUMIN 3.6 03/03/2015 0756   ALBUMIN 3.6 08/17/2014 0838   AST 20 03/03/2015 0756   AST 16 08/17/2014 0838   ALT 17 03/03/2015 0756   ALT 10 08/17/2014 0838   ALKPHOS 92 03/03/2015 0756   ALKPHOS 105 08/17/2014 0838   BILITOT 0.26 03/03/2015 0756   BILITOT 0.3 08/17/2014 0838   GFRNONAA 99.73 12/07/2009 0911   GFRAA 94 05/22/2008 0847    INo results found for: SPEP, UPEP  Lab Results  Component Value Date   WBC 10.2 03/03/2015   NEUTROABS 5.9 03/03/2015   HGB 12.1 03/03/2015   HCT 37.7 03/03/2015   MCV 84.8 03/03/2015   PLT 270 03/03/2015      Chemistry      Component Value Date/Time   NA 138 03/03/2015 0756   NA 138 08/17/2014 0838   K 3.6 03/03/2015 0756   K 3.8 08/17/2014 0838   CL 101 08/17/2014 0838   CO2 24 03/03/2015 0756   CO2 29 08/17/2014 0838   BUN 17.8 03/03/2015 0756   BUN 17 08/17/2014 0838   CREATININE 0.9 03/03/2015 0756   CREATININE 0.82 08/17/2014 0838      Component Value Date/Time   CALCIUM 8.8 03/03/2015 0756   CALCIUM 8.8 08/17/2014 0838   ALKPHOS 92 03/03/2015 0756   ALKPHOS 105 08/17/2014 0838   AST 20 03/03/2015 0756   AST 16 08/17/2014 0838   ALT 17 03/03/2015 0756   ALT 10 08/17/2014 0838   BILITOT 0.26 03/03/2015 0756   BILITOT 0.3 08/17/2014 0838       No results found for: LABCA2  No components found for:  UVOZD664  No results for input(s): INR in the last 168 hours.  Urinalysis No results found for: COLORURINE, APPEARANCEUR, LABSPEC, PHURINE, GLUCOSEU, HGBUR, BILIRUBINUR, KETONESUR, PROTEINUR, UROBILINOGEN, NITRITE, LEUKOCYTESUR  STUDIES:  Mr Knee Left  Wo Contrast  07/01/2015  CLINICAL DATA:  Chronic intermittent left knee pain. History of left knee surgery approximately 10 years ago. Initial encounter. EXAM: MRI OF THE LEFT KNEE WITHOUT CONTRAST TECHNIQUE: Multiplanar, multisequence MR imaging of the knee was performed. No intravenous contrast was administered. COMPARISON:  None. FINDINGS: MENISCI Medial meniscus:  Intact with degenerative signal noted. Lateral meniscus:  Intact with mild degenerative signal noted. LIGAMENTS Cruciates:  Intact. Collaterals:  Intact. CARTILAGE Patellofemoral: Mild hyaline cartilage loss is seen along the lateral patellar facet. Medial:  Mildly degenerated. Lateral:  Mildly degenerated. Joint:  No joint effusion. Popliteal Fossa:  No Baker's cyst. Extensor Mechanism:  Intact. Bones:  Tiny osteophytes are seen about the medial compartment. IMPRESSION: Negative for meniscal or ligament tear. Mild osteoarthritis. Electronically Signed   By: Inge Rise M.D.   On: 07/01/2015 10:29    ASSESSMENT: 69 y.o. [place], [place] woman status post right breast biopsy 02/24/2015 for a clinical T1a N0, stage IA invasive lobular carcinoma, grade 1, estrogen and progesterone receptor strongly positive, HER-2 not amplified, with an MIB-1 of 3%  (1)  Right lumpectomy and sentinel lymph node sampling 03/09/2015 showed a pT1b pN0, stage IA  Invasive lobular carcinoma, grade 2 , repeat HER-2 again negative  (2) adjuvant radiation 04/07/2015 through 05/13/2015: Right breast 4860 cGy in 27 sessions, no boost    (3)  To start tamoxifen in January 2017  (a)  Status post hysterectomy without salpingo-oophorectomy remotely  PLAN:  Halina has completed her local  treatment for breast cancer. So far she has had no systemic treatment. She understands the risk of systemic bread in her case is low. The risk of local recurrence is also low. Nevertheless these risks are not 0 and they will be cut in half by her taking anti-estrogens for 5 years.  In addition she has a 1% per year chance of developing another breast cancer in either breast. This also will be cut in half by her taking anti-estrogens.  We then discussed the difference between tamoxifen and anastrozole. She has a good understanding of the possible toxicities, side effects and complications of these agents. Because she is status post hysterectomy, she is a particularly good candidate for tamoxifen. This of course will also allow her to use vaginal estrogens as needed.  She is a ready having hot flashes so we are also starting venlafaxine at this point. She will only be receiving 37.5 mg. She will let me know if that does not work for her and we will increase the dose to 75 mg.  I'm going to see her again in February. If she is tolerating treatment well we will start seeing her on a every 6 month basis thereafter.  She knows to call for any problems that may develop before her next visit here.  Chauncey Cruel, MD   07/21/2015 10:14 AM Medical Oncology and Hematology [facility] [address] [place],  [address] Tel. [phone]    Fax. [phone]

## 2015-07-21 NOTE — Telephone Encounter (Signed)
Appointments made and avs printed for patient °

## 2015-08-18 ENCOUNTER — Ambulatory Visit (INDEPENDENT_AMBULATORY_CARE_PROVIDER_SITE_OTHER): Payer: Medicare Other

## 2015-08-18 DIAGNOSIS — E538 Deficiency of other specified B group vitamins: Secondary | ICD-10-CM

## 2015-08-18 MED ORDER — CYANOCOBALAMIN 1000 MCG/ML IJ SOLN
1000.0000 ug | Freq: Once | INTRAMUSCULAR | Status: AC
  Administered 2015-08-18: 1000 ug via INTRAMUSCULAR

## 2015-09-29 ENCOUNTER — Other Ambulatory Visit (HOSPITAL_BASED_OUTPATIENT_CLINIC_OR_DEPARTMENT_OTHER): Payer: Medicare Other

## 2015-09-29 ENCOUNTER — Telehealth: Payer: Self-pay | Admitting: Oncology

## 2015-09-29 ENCOUNTER — Ambulatory Visit (HOSPITAL_BASED_OUTPATIENT_CLINIC_OR_DEPARTMENT_OTHER): Payer: Medicare Other | Admitting: Oncology

## 2015-09-29 VITALS — BP 169/63 | HR 61 | Temp 97.8°F | Resp 18 | Ht 65.0 in | Wt 207.7 lb

## 2015-09-29 DIAGNOSIS — C50211 Malignant neoplasm of upper-inner quadrant of right female breast: Secondary | ICD-10-CM

## 2015-09-29 LAB — COMPREHENSIVE METABOLIC PANEL
ALBUMIN: 3.5 g/dL (ref 3.5–5.0)
ALK PHOS: 67 U/L (ref 40–150)
ALT: 14 U/L (ref 0–55)
AST: 17 U/L (ref 5–34)
Anion Gap: 9 mEq/L (ref 3–11)
BUN: 13.9 mg/dL (ref 7.0–26.0)
CHLORIDE: 100 meq/L (ref 98–109)
CO2: 29 mEq/L (ref 22–29)
CREATININE: 0.9 mg/dL (ref 0.6–1.1)
Calcium: 8.7 mg/dL (ref 8.4–10.4)
EGFR: 70 mL/min/{1.73_m2} — ABNORMAL LOW (ref >90)
Glucose: 73 mg/dl (ref 70–140)
Potassium: 3.9 mEq/L (ref 3.5–5.1)
Sodium: 138 mEq/L (ref 136–145)
Total Bilirubin: <0.3 mg/dL (ref 0.20–1.20)
Total Protein: 6.8 g/dL (ref 6.4–8.3)

## 2015-09-29 LAB — CBC WITH DIFFERENTIAL/PLATELET
BASO%: 1.2 % (ref 0.0–2.0)
BASOS ABS: 0.1 10*3/uL (ref 0.0–0.1)
EOS%: 4.6 % (ref 0.0–7.0)
Eosinophils Absolute: 0.3 10*3/uL (ref 0.0–0.5)
HEMATOCRIT: 36.7 % (ref 34.8–46.6)
HEMOGLOBIN: 11.9 g/dL (ref 11.6–15.9)
LYMPH#: 1.4 10*3/uL (ref 0.9–3.3)
LYMPH%: 22 % (ref 14.0–49.7)
MCH: 27.7 pg (ref 25.1–34.0)
MCHC: 32.4 g/dL (ref 31.5–36.0)
MCV: 85.5 fL (ref 79.5–101.0)
MONO#: 0.7 10*3/uL (ref 0.1–0.9)
MONO%: 10.4 % (ref 0.0–14.0)
NEUT#: 4.1 10*3/uL (ref 1.5–6.5)
NEUT%: 61.8 % (ref 38.4–76.8)
Platelets: 264 10*3/uL (ref 145–400)
RBC: 4.29 10*6/uL (ref 3.70–5.45)
RDW: 15.8 % — AB (ref 11.2–14.5)
WBC: 6.6 10*3/uL (ref 3.9–10.3)

## 2015-09-29 MED ORDER — VENLAFAXINE HCL ER 37.5 MG PO CP24
75.0000 mg | ORAL_CAPSULE | Freq: Every day | ORAL | Status: DC
Start: 2015-09-29 — End: 2015-11-18

## 2015-09-29 MED ORDER — GABAPENTIN 300 MG PO CAPS
300.0000 mg | ORAL_CAPSULE | Freq: Every day | ORAL | Status: DC
Start: 2015-09-29 — End: 2015-12-01

## 2015-09-29 MED ORDER — TAMOXIFEN CITRATE 20 MG PO TABS: 20.0000 mg | ORAL_TABLET | Freq: Every day | ORAL | Status: AC

## 2015-09-29 NOTE — Progress Notes (Signed)
[facility]  Telephone:(336) [phone] Fax:(336) [phone]     ID: DANAYAH SMYRE DOB: 1945/10/31  MR#: [id number]  PVX#:[id number]  Patient Care Team: Abner Greenspan, MD as PCP - General Huel Cote, NP as Nurse Practitioner (Obstetrics and Gynecology) Stark Klein, MD as Consulting Physician (General Surgery) Chauncey Cruel, MD as Consulting Physician (Oncology) Arloa Koh, MD as Consulting Physician (Radiation Oncology) Mauro Kaufmann, RN as Registered Nurse Rockwell Germany, RN as Registered Nurse Sylvan Cheese, NP as Nurse Practitioner (Nurse Practitioner) PCP: Loura Pardon, MD  OTHER MD:  CHIEF COMPLAINT: Estrogen receptor positive breast cancer  CURRENT TREATMENT:  TAMOXIFEN   BREAST CANCER HISTORY:  From the original intake note:  [place] underwent bilateral screening [facility]. This showed a possible mass in the right breast. She was recalled for diagnostic right mammography with tomosynthesis and right breast ultrasonography 02/17/2015. This found the breast density to be category C. In the right breast upper inner quadrant there was a persistent poorly marginated nodule which was not palpable. By ultrasound this measured 0.5 cm. There was no evidence of right axillary lymphadenopathy.  Biopsy of this mass 02/24/2015 showed ([id number]) an invasive lobular breast cancer, E-cadherin negative, grade 1, estrogen receptor 95% positive, progesterone receptor 60% positive, both with strong staining intensity, with an MIB-1 of 3%, and no HER-2 amplification, the signals ratio being 1.29 and the number per cell 2.00.  Her subsequent history is as detailed below  INTERVAL HISTORY: Vivuian returns today for follow-up of her breast cancer accompanied by her husband Jeneen Rinks. SHE STARTED TAMOXIFEN ON 08/01/2015. sHE IS HAVING SIGNIFICANT ISSUES WITH HOT FLASHES. These are both during the day and at night. They  do wake her up at least twice nightly. It happened several times a day as well. She also has some vaginal discharge associated with the tamoxifen. She obtains it at an excellent price  REVIEW OF SYSTEMS: She is currently participating in the finding normal group and enjoying in a great deal.she had her husband have signed up for [employer] but will not be able to participate until June. Accordingly they've gone ahead and started a membership otherwise she'll should they can start exercising. They have not actually started but they are planning to. Postoperatively she has a little discomfort sometime in the upper part of the breast, which feels a little tight, like a pulling. She has occasional shooting pains and some soreness. Otherwise a detailed review of systems today was stable  PAST MEDICAL HISTORY: Past Medical History  Diagnosis Date  . GERD (gastroesophageal reflux disease)   . Hyperlipidemia   . Obesity   . Vitamin B 12 deficiency   . Hypoglycemia   . Hypertension   . Anemia   . Breast cancer of upper-inner quadrant of right female breast ([place]) 02/26/2015  . Hot flashes   . Anxiety     PAST SURGICAL HISTORY: Past Surgical History  Procedure Laterality Date  . Partial hysterectomy  1990's    bleeding  . Knee surgery      left  . Gastric bypass  08/2003  . Hernia repair    . Abdominal hysterectomy    . Tubal ligation    . Radioactive seed guided mastectomy with axillary sentinel lymph node biopsy Right 03/09/2015    Procedure: RADIOACTIVE SEED GUIDED RIGHT PARTIAL MASTECTOMY WITH AXILLARY SENTINEL LYMPH NODE BIOPSY;  Surgeon: Stark Klein, MD;  Location: [facility];  Service: General;  Laterality: Right;    FAMILY HISTORY Family History  Problem Relation Age of Onset  . Heart disease Mother     MI  . Hypertension Mother   . Alcohol abuse Father   . Heart disease Father     smoker  . Hypertension Father   . Breast cancer Paternal Grandmother   . Diabetes  Maternal Aunt    the patient's father died at the age of 13 and her mother at the age of 8, both from heart attacks in the setting of tobacco abuse. The patient has 3 brothers, one sister. The patient's paternal grandmother may have been diagnosed with breast cancer in her late 40s, but that is not certain. There is no other history of breast or ovarian cancer in the family to the patient's knowledge.  GYNECOLOGIC HISTORY:  Patient's last menstrual period was 06/08/1989. Menarche age 33, first live birth age 44; the patient is [place] P2. She underwent hysterectomy without salpingo-oophorectomy more than 20 years ago. She has been on hormone replacement more than 20 years. She understands this does increase the risk of developing breast cancer. This was stopped July 2016.  SOCIAL HISTORY:  Anouk used to work for [employer] but is now retired. Her husband Kehlani Vancamp (goes by Jeneen Rinks) worked 33 years for [employer]. The patient's daughter Romero Belling lives in [place] and is Mudlogger of the afterschool program in [place]. The patient's son Jeneen Rinks lives in [place] and works for [employer], a company that cleans up after disaster or certain crimes. The patient also has an adopted daughter with knee Maricela Bo who lives in [place] and is a homemaker. The patient has 2 blood grandchildren and 2 adopted grandchildren. At home its the patient and her husband, her daughter and her husband and 2 grandchildren. Tomeko attends a local [place] DIRECTIVES: Not in place   HEALTH MAINTENANCE: Social History  Substance Use Topics  . Smoking status: Never Smoker   . Smokeless tobacco: Never Used  . Alcohol Use: No     Colonoscopy: Never (status post bypass surgery)  PAP: 2015  Bone density: c. 2012; normal  Lipid panel:  Allergies  Allergen Reactions  . Crestor [Rosuvastatin Calcium] Other (See Comments)    Myalgia   . Lipitor [Atorvastatin Calcium] Other  (See Comments)    Aches and pains  . Penicillins Shortness Of Breath and Swelling  . Zocor [Simvastatin - High Dose] Other (See Comments)    Muscle/joint aches    Current Outpatient Prescriptions  Medication Sig Dispense Refill  . BIOTIN PO Take 1 tablet by mouth daily.    Marland Kitchen CALCIUM PO Take by mouth daily.      . cholecalciferol (VITAMIN D) 400 UNITS TABS Take 400 Units by mouth daily.     . cyanocobalamin (,VITAMIN B-12,) 1000 MCG/ML injection Inject 1,000 mcg into the muscle once. Every 2 months     . hydrochlorothiazide (HYDRODIURIL) 25 MG tablet Take 1 tablet (25 mg total) by mouth daily. 90 tablet 3  . Multiple Vitamin (MULTIVITAMIN) capsule Take 1 capsule by mouth daily.      Marland Kitchen omeprazole (PRILOSEC) 20 MG capsule Take 1 capsule (20 mg total) by mouth daily. 90 capsule 3  . venlafaxine XR (EFFEXOR-XR) 37.5 MG 24 hr capsule Take 1 capsule (37.5 mg total) by mouth daily with breakfast. 90 capsule 4   No current facility-administered medications for this visit.    OBJECTIVE: Middle-aged white woman In  no acute distress Filed Vitals:   09/29/15 1022  BP: 169/63  Pulse: 61  Temp: 97.8 F (36.6 C)  Resp: 18     Body mass index is 34.56 kg/(m^2).    ECOG FS:1 - Symptomatic but completely ambulatory  Sclerae unicteric, EOMs intact Oropharynx clear, dentition in good repair No cervical or supraclavicular adenopathy Lungs no rales or rhonchi Heart regular rate and rhythm Abd soft, nontender, positive bowel sounds MSK no focal spinal tenderness, no upper extremity lymphedema Neuro: nonfocal, well oriented, appropriate affect Breasts: The right breast is status post radiation and is still slightly hyperpigmented. There is some skin thickening. There is no evidence of disease recurrence. The right axilla is benign. The left breast is unremarkable.   LAB RESULTS:  CMP     Component Value Date/Time   NA 138 03/03/2015 0756   NA 138 08/17/2014 0838   K 3.6 03/03/2015 0756   K  3.8 08/17/2014 0838   CL 101 08/17/2014 0838   CO2 24 03/03/2015 0756   CO2 29 08/17/2014 0838   GLUCOSE 138 03/03/2015 0756   GLUCOSE 114* 08/17/2014 0838   BUN 17.8 03/03/2015 0756   BUN 17 08/17/2014 0838   CREATININE 0.9 03/03/2015 0756   CREATININE 0.82 08/17/2014 0838   CALCIUM 8.8 03/03/2015 0756   CALCIUM 8.8 08/17/2014 0838   PROT 7.1 03/03/2015 0756   PROT 7.0 08/17/2014 0838   ALBUMIN 3.6 03/03/2015 0756   ALBUMIN 3.6 08/17/2014 0838   AST 20 03/03/2015 0756   AST 16 08/17/2014 0838   ALT 17 03/03/2015 0756   ALT 10 08/17/2014 0838   ALKPHOS 92 03/03/2015 0756   ALKPHOS 105 08/17/2014 0838   BILITOT 0.26 03/03/2015 0756   BILITOT 0.3 08/17/2014 0838   GFRNONAA 99.73 12/07/2009 0911   GFRAA 94 05/22/2008 0847    INo results found for: SPEP, UPEP  Lab Results  Component Value Date   WBC 6.6 09/29/2015   NEUTROABS 4.1 09/29/2015   HGB 11.9 09/29/2015   HCT 36.7 09/29/2015   MCV 85.5 09/29/2015   PLT 264 09/29/2015      Chemistry      Component Value Date/Time   NA 138 03/03/2015 0756   NA 138 08/17/2014 0838   K 3.6 03/03/2015 0756   K 3.8 08/17/2014 0838   CL 101 08/17/2014 0838   CO2 24 03/03/2015 0756   CO2 29 08/17/2014 0838   BUN 17.8 03/03/2015 0756   BUN 17 08/17/2014 0838   CREATININE 0.9 03/03/2015 0756   CREATININE 0.82 08/17/2014 0838      Component Value Date/Time   CALCIUM 8.8 03/03/2015 0756   CALCIUM 8.8 08/17/2014 0838   ALKPHOS 92 03/03/2015 0756   ALKPHOS 105 08/17/2014 0838   AST 20 03/03/2015 0756   AST 16 08/17/2014 0838   ALT 17 03/03/2015 0756   ALT 10 08/17/2014 0838   BILITOT 0.26 03/03/2015 0756   BILITOT 0.3 08/17/2014 0838       No results found for: LABCA2  No components found for: EFEOF121  No results for input(s): INR in the last 168 hours.  Urinalysis No results found for: COLORURINE, APPEARANCEUR, LABSPEC, PHURINE, GLUCOSEU, HGBUR, BILIRUBINUR, KETONESUR, PROTEINUR, UROBILINOGEN, NITRITE,  LEUKOCYTESUR  STUDIES: No results found.  ASSESSMENT: 70 y.o. [place], [place] woman status post right breast biopsy 02/24/2015 for a clinical T1a N0, stage IA invasive lobular carcinoma, grade 1, estrogen and progesterone receptor strongly positive, HER-2 not amplified, with an MIB-1 of 3%  (  1)  Right lumpectomy and sentinel lymph node sampling 03/09/2015 showed a pT1b pN0, stage IA  Invasive lobular carcinoma, grade 2 , repeat HER-2 again negative  (2) adjuvant radiation 04/07/2015 through 05/13/2015: Right breast 4860 cGy in 27 sessions, no boost    (3)  started tamoxifen in January 2017  (a)  Status post hysterectomy without salpingo-oophorectomy remotely  PLAN:  [place] TAMOXIFEN BUT i THINK WE CAN DO BETTER. tHE BIGGEST ISSUE OF COURSE IS THE HOT FLASHES. wE ARE GOING TO INCREASE THE VENLAFAXINE TO 75 MG DAILY. sHE WILL SIMPLY TAKE 2 OF RECURRENT TABLETS AND WHEN SHE RUNS OUT SHE WILL LET us KNOW AND WE WILL CALL IN A PRESCRIPTION FOR THIS 75 MG. tHAT ALREADY AND WILL MAKE A DIFFERENCE  fOR THE NIGHTTIME HOT FLASHES i JUST PRESCRIBED GABAPENTIN 300 MG FOR HER. GEN. STANCE THIS WILL MAKE HER A LITTLE SLEEPY AND SHE SHOULD NOT TAKE IT DURING THE DAY. i ANTICIPATE THAT IT WILL MAKE AN IMMEDIATE DIFFERENCE TO HER ABILITY TO SLEEP OVER NIGHT, WHICH IN ITSELF WILL BE HELPFUL.  sHE IS ENJOYING FINDING YOUR u NORMAL AND SHE HAS ALREADY SIGNED UP FOR THE l[employer] PROGRAM AT THE y BEGINNING IN jUNE. i [place] TRAILS TO RECOVERY PROGRAM AND i GAVE HER THAT INFORMATION AS WELL.  AS FAR AS THE "RUNNY NOSE" IS CONCERNED SHE IS SIMPLY GOING TO TRY LORATADINE. Also I think she would benefit from a sports bra and I have written her a prescription.  sHE IS GOING TO RETURN TO SEE ME IN mAY AND IF THINGS ARE NOT CONSIDERABLY BETTER BY THEN WE WILL CONSIDER SWITCHING TO ANASTROZOLE.  She knows to call for any problems  that may develop before her next visit here.    Marland KitchenChauncey Cruel, MD   09/29/2015 10:42 AM Medical Oncology and Hematology [facility] [address] [place], [place] [address] Tel. [phone]    Fax. [phone]

## 2015-09-29 NOTE — Telephone Encounter (Signed)
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

## 2015-10-16 ENCOUNTER — Other Ambulatory Visit: Payer: Self-pay | Admitting: Family Medicine

## 2015-10-18 NOTE — Telephone Encounter (Signed)
Electronic refill request, pt hasn't had a recent f/u or CPE but is keeping up with her oncology appt., please advise

## 2015-10-18 NOTE — Telephone Encounter (Signed)
Please schedule 30 min f/u in 6 mo and refill until then

## 2015-10-19 NOTE — Telephone Encounter (Signed)
Appt. scheduled (earlier per pt request) and med refilled

## 2015-10-21 ENCOUNTER — Ambulatory Visit: Payer: Medicare Other

## 2015-11-08 DIAGNOSIS — C50211 Malignant neoplasm of upper-inner quadrant of right female breast: Secondary | ICD-10-CM | POA: Diagnosis not present

## 2015-11-18 ENCOUNTER — Other Ambulatory Visit: Payer: Self-pay

## 2015-11-18 MED ORDER — VENLAFAXINE HCL ER 75 MG PO CP24
75.0000 mg | ORAL_CAPSULE | Freq: Every day | ORAL | Status: DC
Start: 2015-11-18 — End: 2015-12-01

## 2015-11-18 MED ORDER — VENLAFAXINE HCL ER 75 MG PO CP24: 75.0000 mg | ORAL_CAPSULE | Freq: Every day | ORAL | Status: DC

## 2015-12-01 ENCOUNTER — Ambulatory Visit (HOSPITAL_BASED_OUTPATIENT_CLINIC_OR_DEPARTMENT_OTHER): Payer: Medicare Other | Admitting: Oncology

## 2015-12-01 ENCOUNTER — Telehealth: Payer: Self-pay | Admitting: Oncology

## 2015-12-01 ENCOUNTER — Other Ambulatory Visit (HOSPITAL_BASED_OUTPATIENT_CLINIC_OR_DEPARTMENT_OTHER): Payer: Medicare Other

## 2015-12-01 VITALS — BP 159/59 | HR 72 | Temp 97.8°F | Resp 18 | Ht 65.0 in | Wt 200.0 lb

## 2015-12-01 DIAGNOSIS — Z170 Estrogen receptor positive status [ER+]: Secondary | ICD-10-CM

## 2015-12-01 DIAGNOSIS — Z79810 Long term (current) use of selective estrogen receptor modulators (SERMs): Secondary | ICD-10-CM | POA: Diagnosis not present

## 2015-12-01 DIAGNOSIS — C50211 Malignant neoplasm of upper-inner quadrant of right female breast: Secondary | ICD-10-CM | POA: Diagnosis not present

## 2015-12-01 LAB — COMPREHENSIVE METABOLIC PANEL
ALT: 16 U/L (ref 0–55)
AST: 16 U/L (ref 5–34)
Albumin: 3.5 g/dL (ref 3.5–5.0)
Alkaline Phosphatase: 62 U/L (ref 40–150)
Anion Gap: 9 mEq/L (ref 3–11)
BUN: 15.6 mg/dL (ref 7.0–26.0)
CHLORIDE: 101 meq/L (ref 98–109)
CO2: 30 meq/L — AB (ref 22–29)
Calcium: 9 mg/dL (ref 8.4–10.4)
Creatinine: 0.8 mg/dL (ref 0.6–1.1)
EGFR: 74 mL/min/{1.73_m2} — AB (ref >90)
Glucose: 81 mg/dl (ref 70–140)
POTASSIUM: 3.6 meq/L (ref 3.5–5.1)
SODIUM: 141 meq/L (ref 136–145)
Total Bilirubin: 0.33 mg/dL (ref 0.20–1.20)
Total Protein: 6.9 g/dL (ref 6.4–8.3)

## 2015-12-01 LAB — CBC WITH DIFFERENTIAL/PLATELET
BASO%: 1.1 % (ref 0.0–2.0)
Basophils Absolute: 0.1 10*3/uL (ref 0.0–0.1)
EOS%: 3.9 % (ref 0.0–7.0)
Eosinophils Absolute: 0.2 10*3/uL (ref 0.0–0.5)
HEMATOCRIT: 36.2 % (ref 34.8–46.6)
HEMOGLOBIN: 11.8 g/dL (ref 11.6–15.9)
LYMPH#: 1.3 10*3/uL (ref 0.9–3.3)
LYMPH%: 20.9 % (ref 14.0–49.7)
MCH: 28.6 pg (ref 25.1–34.0)
MCHC: 32.5 g/dL (ref 31.5–36.0)
MCV: 88 fL (ref 79.5–101.0)
MONO#: 0.6 10*3/uL (ref 0.1–0.9)
MONO%: 8.9 % (ref 0.0–14.0)
NEUT#: 4.1 10*3/uL (ref 1.5–6.5)
NEUT%: 65.2 % (ref 38.4–76.8)
Platelets: 254 10*3/uL (ref 145–400)
RBC: 4.12 10*6/uL (ref 3.70–5.45)
RDW: 15 % — AB (ref 11.2–14.5)
WBC: 6.3 10*3/uL (ref 3.9–10.3)

## 2015-12-01 MED ORDER — GABAPENTIN 300 MG PO CAPS: 300.0000 mg | ORAL_CAPSULE | Freq: Every day | ORAL | Status: DC

## 2015-12-01 MED ORDER — VENLAFAXINE HCL ER 75 MG PO CP24: 75.0000 mg | ORAL_CAPSULE | Freq: Every day | ORAL | Status: DC

## 2015-12-01 MED ORDER — TAMOXIFEN CITRATE 20 MG PO TABS: 20.0000 mg | ORAL_TABLET | Freq: Every day | ORAL | Status: AC

## 2015-12-01 NOTE — Progress Notes (Signed)
[facility]  Telephone:(336) [phone] Fax:(336) [phone]     ID: Jennifer Bartlett DOB: 03-16-1946  MR#: [id number]  [id number]#:[id number]  Patient Care Team: Abner Greenspan, MD as PCP - General Huel Cote, NP as Nurse Practitioner (Obstetrics and Gynecology) Stark Klein, MD as Consulting Physician (General Surgery) Chauncey Cruel, MD as Consulting Physician (Oncology) Arloa Koh, MD as Consulting Physician (Radiation Oncology) Mauro Kaufmann, RN as Registered Nurse Rockwell Germany, RN as Registered Nurse Sylvan Cheese, NP as Nurse Practitioner (Nurse Practitioner) PCP: Loura Pardon, MD  OTHER MD:  CHIEF COMPLAINT: Estrogen receptor positive breast cancer  CURRENT TREATMENT:  TAMOXIFEN   BREAST CANCER HISTORY:  From the original intake note:  Jennifer Bartlett underwent bilateral screening mammography with tomography [facility] July [facility]. This showed a possible mass in the right breast. She was recalled for diagnostic right mammography with tomosynthesis and right breast ultrasonography 02/17/2015. This found the breast density to be category C. In the right breast upper inner quadrant there was a persistent poorly marginated nodule which was not palpable. By ultrasound this measured 0.5 cm. There was no evidence of right axillary lymphadenopathy.  Biopsy of this mass 02/24/2015 showed ([id number]) an invasive lobular breast cancer, E-cadherin negative, grade 1, estrogen receptor 95% positive, progesterone receptor 60% positive, both with strong staining intensity, with an MIB-1 of 3%, and no HER-2 amplification, the signals ratio being 1.29 and the number per cell 2.00.  Her subsequent history is as detailed below  INTERVAL HISTORY: Jennifer Bartlett returns today for follow-up of her estrogen receptor positive breast cancer accompanied by her husband Jeneen Rinks. She continues on tamoxifen, now with much better tolerance. Hot flashes at night occur once or twice a month.  Hot flashes during the day are much diminished. She is tolerating the evening Neurontin and the daytime Effexor with no side effects that she is aware of. She does have vaginal wetness and she is wearing a pad to deal with that issue. Otherwise she obtains tamoxifen at an excellent price.  REVIEW OF SYSTEMS: She has some joint pains here and there which are not more intense or persistent than before. She has been found to have a ventral hernia which may need repair. Otherwise a detailed review of systems today was stable  PAST MEDICAL HISTORY: Past Medical History  Diagnosis Date  . GERD (gastroesophageal reflux disease)   . Hyperlipidemia   . Obesity   . Vitamin B 12 deficiency   . Hypoglycemia   . Hypertension   . Anemia   . Breast cancer of upper-inner quadrant of right female breast ([facility] [facility]) 02/26/2015  . Hot flashes   . Anxiety     PAST SURGICAL HISTORY: Past Surgical History  Procedure Laterality Date  . Partial hysterectomy  1990's    bleeding  . Knee surgery      left  . Gastric bypass  08/2003  . Hernia repair    . Abdominal hysterectomy    . Tubal ligation    . Radioactive seed guided mastectomy with axillary sentinel lymph node biopsy Right 03/09/2015    Procedure: RADIOACTIVE SEED GUIDED RIGHT PARTIAL MASTECTOMY WITH AXILLARY SENTINEL LYMPH NODE BIOPSY;  Surgeon: Stark Klein, MD;  Location: [facility];  Service: General;  Laterality: Right;    FAMILY HISTORY Family History  Problem Relation Age of Onset  . Heart disease Mother     MI  . Hypertension Mother   . Alcohol abuse Father   .  Heart disease Father     smoker  . Hypertension Father   . Breast cancer Paternal Grandmother   . Diabetes Maternal Aunt    the patient's father died at the age of 19 and her mother at the age of 26, both from heart attacks in the setting of tobacco abuse. The patient has 3 brothers, one sister. The patient's paternal grandmother may have been diagnosed with breast  cancer in her late 25s, but that is not certain. There is no other history of breast or ovarian cancer in the family to the patient's knowledge.  GYNECOLOGIC HISTORY:  Patient's last menstrual period was 06/08/1989. Menarche age 22, first live birth age 39; the patient is [place] P2. She underwent hysterectomy without salpingo-oophorectomy more than 20 years ago. She has been on hormone replacement more than 20 years. She understands this does increase the risk of developing breast cancer. This was stopped July 2016.  SOCIAL HISTORY:  Elan used to work for [employer] but is now retired. Her husband Kameko Hukill (goes by Jeneen Rinks) worked 77 years for [employer]. The patient's daughter Romero Belling lives in [place] and is Mudlogger of the afterschool program in [place]. The patient's son Jeneen Rinks lives in [place] and works for [employer], a company that cleans up after disaster or certain crimes. The patient also has an adopted daughter with knee Maricela Bo who lives in [place] and is a homemaker. The patient has 2 blood grandchildren and 2 adopted grandchildren. At home its the patient and her husband, her daughter and her husband and 2 grandchildren. Raynie attends a local [place] DIRECTIVES: Not in place   HEALTH MAINTENANCE: Social History  Substance Use Topics  . Smoking status: Never Smoker   . Smokeless tobacco: Never Used  . Alcohol Use: No     Colonoscopy: Never (status post bypass surgery)  PAP: 2015  Bone density: c. 2012; normal  Lipid panel:  Allergies  Allergen Reactions  . Crestor [Rosuvastatin Calcium] Other (See Comments)    Myalgia   . Lipitor [Atorvastatin Calcium] Other (See Comments)    Aches and pains  . Penicillins Shortness Of Breath and Swelling  . Zocor [Simvastatin - High Dose] Other (See Comments)    Muscle/joint aches    Current Outpatient Prescriptions  Medication Sig Dispense Refill  . BIOTIN PO Take 1  tablet by mouth daily.    Marland Kitchen CALCIUM PO Take by mouth daily.      . cholecalciferol (VITAMIN D) 400 UNITS TABS Take 400 Units by mouth daily.     . cyanocobalamin (,VITAMIN B-12,) 1000 MCG/ML injection Inject 1,000 mcg into the muscle once. Every 2 months     . gabapentin (NEURONTIN) 300 MG capsule Take 1 capsule (300 mg total) by mouth at bedtime. 90 capsule 4  . hydrochlorothiazide (HYDRODIURIL) 25 MG tablet Take 1 tablet (25 mg total) by mouth daily. 90 tablet 3  . Multiple Vitamin (MULTIVITAMIN) capsule Take 1 capsule by mouth daily.      Marland Kitchen omeprazole (PRILOSEC) 20 MG capsule TAKE 1 CAPSULE (20 MG TOTAL) BY MOUTH DAILY. 90 capsule 0  . venlafaxine XR (EFFEXOR-XR) 75 MG 24 hr capsule Take 1 capsule (75 mg total) by mouth daily with breakfast. 30 capsule 5   No current facility-administered medications for this visit.    OBJECTIVE: Middle-aged white woman Who appears well  Filed Vitals:   12/01/15 1113  BP: 159/59  Pulse: 72  Temp: 97.8 F (36.6 C)  Resp: 18     Body mass index is 33.28 kg/(m^2).    ECOG FS:0 - Asymptomatic  Sclerae unicteric, pupils round and equal Oropharynx clear and moist-- no thrush or other lesions--full upper plate No cervical or supraclavicular adenopathy Lungs no rales or rhonchi Heart regular rate and rhythm Abd soft, obese, nontender, positive bowel sounds MSK no focal spinal tenderness, no upper extremity lymphedema Neuro: nonfocal, well oriented, appropriate affect Breasts: The right breast is status post lumpectomy and radiation. It is still slightly dusky from the radiation. It is a bit firmer than the left breast. The right axilla is benign. The left breast is unremarkable.    LAB RESULTS:  CMP     Component Value Date/Time   NA 138 09/29/2015 0953   NA 138 08/17/2014 0838   K 3.9 09/29/2015 0953   K 3.8 08/17/2014 0838   CL 101 08/17/2014 0838   CO2 29 09/29/2015 0953   CO2 29 08/17/2014 0838   GLUCOSE 73 09/29/2015 0953   GLUCOSE  114* 08/17/2014 0838   BUN 13.9 09/29/2015 0953   BUN 17 08/17/2014 0838   CREATININE 0.9 09/29/2015 0953   CREATININE 0.82 08/17/2014 0838   CALCIUM 8.7 09/29/2015 0953   CALCIUM 8.8 08/17/2014 0838   PROT 6.8 09/29/2015 0953   PROT 7.0 08/17/2014 0838   ALBUMIN 3.5 09/29/2015 0953   ALBUMIN 3.6 08/17/2014 0838   AST 17 09/29/2015 0953   AST 16 08/17/2014 0838   ALT 14 09/29/2015 0953   ALT 10 08/17/2014 0838   ALKPHOS 67 09/29/2015 0953   ALKPHOS 105 08/17/2014 0838   BILITOT <0.30 09/29/2015 0953   BILITOT 0.3 08/17/2014 0838   GFRNONAA 99.73 12/07/2009 0911   GFRAA 94 05/22/2008 0847    INo results found for: SPEP, UPEP  Lab Results  Component Value Date   WBC 6.3 12/01/2015   NEUTROABS 4.1 12/01/2015   HGB 11.8 12/01/2015   HCT 36.2 12/01/2015   MCV 88.0 12/01/2015   PLT 254 12/01/2015      Chemistry      Component Value Date/Time   NA 138 09/29/2015 0953   NA 138 08/17/2014 0838   K 3.9 09/29/2015 0953   K 3.8 08/17/2014 0838   CL 101 08/17/2014 0838   CO2 29 09/29/2015 0953   CO2 29 08/17/2014 0838   BUN 13.9 09/29/2015 0953   BUN 17 08/17/2014 0838   CREATININE 0.9 09/29/2015 0953   CREATININE 0.82 08/17/2014 0838      Component Value Date/Time   CALCIUM 8.7 09/29/2015 0953   CALCIUM 8.8 08/17/2014 0838   ALKPHOS 67 09/29/2015 0953   ALKPHOS 105 08/17/2014 0838   AST 17 09/29/2015 0953   AST 16 08/17/2014 0838   ALT 14 09/29/2015 0953   ALT 10 08/17/2014 0838   BILITOT <0.30 09/29/2015 0953   BILITOT 0.3 08/17/2014 0838       No results found for: LABCA2  No components found for: [id number]  No results for input(s): INR in the last 168 hours.  Urinalysis No results found for: COLORURINE, APPEARANCEUR, LABSPEC, PHURINE, GLUCOSEU, HGBUR, BILIRUBINUR, KETONESUR, PROTEINUR, UROBILINOGEN, NITRITE, LEUKOCYTESUR  STUDIES: No results found.  ASSESSMENT: 70 y.o. [place], [place] woman status post right breast biopsy 02/24/2015 for a clinical T1a  N0, stage IA invasive lobular carcinoma, grade 1, estrogen and progesterone receptor strongly positive, HER-2 not amplified, with an MIB-1 of 3%  (1)  Right lumpectomy and sentinel lymph node sampling 03/09/2015 showed a pT1b pN0,  stage IA  Invasive lobular carcinoma, grade 2 , repeat HER-2 again negative  (2) adjuvant radiation 04/07/2015 through 05/13/2015: Right breast 4860 cGy in 27 sessions, no boost    (3)  started tamoxifen in January 2017  (a)  Status post hysterectomy without salpingo-oophorectomy remotely  PLAN:  [place] is tolerating the tamoxifen well and the plan will be to continue that for 5 years. I do not think it would be worth it to continue for 10 years her to switch to an aromatase inhibitor with its own set of problems I given her already very good prognosis.  She enjoyed participating in "finding attorney normal" and also in [place]. I have encouraged her to continue to exercise regularly.  Otherwise she will see me again in October. If all continues well at that time I will start seeing her on a once a year basis thereafter.    Marland KitchenChauncey Cruel, MD   12/01/2015 11:28 AM Medical Oncology and Hematology [facility] [address] [place], [place] [address] Tel. [phone]    Fax. [phone]

## 2015-12-01 NOTE — Telephone Encounter (Signed)
appt made and avs printed °

## 2015-12-09 DIAGNOSIS — C50911 Malignant neoplasm of unspecified site of right female breast: Secondary | ICD-10-CM | POA: Diagnosis not present

## 2015-12-15 DIAGNOSIS — C50911 Malignant neoplasm of unspecified site of right female breast: Secondary | ICD-10-CM | POA: Diagnosis not present

## 2015-12-20 ENCOUNTER — Other Ambulatory Visit: Payer: Self-pay | Admitting: Family Medicine

## 2015-12-20 NOTE — Telephone Encounter (Signed)
Pt left v/m requesting cb that HCTZ and omeprazole was refilled for 90 day supply. Pt last seen 07/2014 but has appt scheduled with Dr Glori Bickers on 01/11/16.[facility] [address]. Pt advised refill done per protocol.

## 2015-12-28 DIAGNOSIS — C50911 Malignant neoplasm of unspecified site of right female breast: Secondary | ICD-10-CM | POA: Diagnosis not present

## 2016-01-11 ENCOUNTER — Encounter: Payer: Self-pay | Admitting: Family Medicine

## 2016-01-11 ENCOUNTER — Ambulatory Visit (INDEPENDENT_AMBULATORY_CARE_PROVIDER_SITE_OTHER): Payer: Medicare Other | Admitting: Family Medicine

## 2016-01-11 VITALS — BP 128/70 | HR 62 | Temp 98.0°F | Ht 65.0 in | Wt 195.8 lb

## 2016-01-11 DIAGNOSIS — K219 Gastro-esophageal reflux disease without esophagitis: Secondary | ICD-10-CM

## 2016-01-11 DIAGNOSIS — I10 Essential (primary) hypertension: Secondary | ICD-10-CM | POA: Diagnosis not present

## 2016-01-11 DIAGNOSIS — E669 Obesity, unspecified: Secondary | ICD-10-CM

## 2016-01-11 DIAGNOSIS — C50211 Malignant neoplasm of upper-inner quadrant of right female breast: Secondary | ICD-10-CM | POA: Diagnosis not present

## 2016-01-11 DIAGNOSIS — E2839 Other primary ovarian failure: Secondary | ICD-10-CM

## 2016-01-11 MED ORDER — OMEPRAZOLE 20 MG PO CPDR: DELAYED_RELEASE_CAPSULE | ORAL | Status: DC

## 2016-01-11 MED ORDER — HYDROCHLOROTHIAZIDE 25 MG PO TABS: 25.0000 mg | ORAL_TABLET | Freq: Every day | ORAL | Status: DC

## 2016-01-11 NOTE — Progress Notes (Signed)
Subjective:    Patient ID: Jennifer Bartlett, female    DOB: 1946/02/15, 70 y.o.   MRN: [id number]  HPI Here for f/u of chronic health problems   In the midst of breast cancer treatment  Now on tamoxifen (giving her hot flashes)- given effexor to help with that  Trying to get her energy back right now- coming along really well however  Got fitted for bras/prosthesis  Very positive attitude  Wt is down 5 lb with bmi of 32   Taking care of herself  Trying to eat very healthy  Is also walking for exercise - short walks to start with    GERD- on omeprazole - takes it every day- really bad heartburn if she misses a day  Has not had dexa for a long tme  No falls  Broke her L 5th toe stubbing it  Is interested in getting one in the summer  Wants to do in [place] Taking ca and D  bp is stable today (2nd check 128/70)  No cp or palpitations or headaches or edema  No side effects to medicines  BP Readings from Last 3 Encounters:  01/11/16 140/66  12/01/15 159/59  09/29/15 169/63     On hctz 25 mg   Labs in May   Chemistry      Component Value Date/Time   NA 141 12/01/2015 1050   NA 138 08/17/2014 0838   K 3.6 12/01/2015 1050   K 3.8 08/17/2014 0838   CL 101 08/17/2014 0838   CO2 30* 12/01/2015 1050   CO2 29 08/17/2014 0838   BUN 15.6 12/01/2015 1050   BUN 17 08/17/2014 0838   CREATININE 0.8 12/01/2015 1050   CREATININE 0.82 08/17/2014 0838      Component Value Date/Time   CALCIUM 9.0 12/01/2015 1050   CALCIUM 8.8 08/17/2014 0838   ALKPHOS 62 12/01/2015 1050   ALKPHOS 105 08/17/2014 0838   AST 16 12/01/2015 1050   AST 16 08/17/2014 0838   ALT 16 12/01/2015 1050   ALT 10 08/17/2014 0838   BILITOT 0.33 12/01/2015 1050   BILITOT 0.3 08/17/2014 0838      Lab Results  Component Value Date   WBC 6.3 12/01/2015   HGB 11.8 12/01/2015   HCT 36.2 12/01/2015   MCV 88.0 12/01/2015   PLT 254 12/01/2015    Glucose 81  Patient Active Problem List   Diagnosis Date  Noted  . Estrogen deficiency 01/11/2016  . Breast cancer of upper-inner quadrant of right female breast ([place]) 02/26/2015  . Colon cancer screening 09/06/2012  . Vitamin D deficiency 12/28/2009  . STRESS REACTION, ACUTE, WITH EMOTIONAL DISTURBANCE 12/07/2009  . HYPOGLYCEMIA NOS 05/28/2007  . Vitamin B12 deficiency 03/07/2007  . HYPERCHOLESTEROLEMIA 03/07/2007  . OBESITY 03/07/2007  . Essential hypertension 03/07/2007  . GERD 03/07/2007   Past Medical History  Diagnosis Date  . GERD (gastroesophageal reflux disease)   . Hyperlipidemia   . Obesity   . Vitamin B 12 deficiency   . Hypoglycemia   . Hypertension   . Anemia   . Breast cancer of upper-inner quadrant of right female breast ([place]) 02/26/2015  . Hot flashes   . Anxiety    Past Surgical History  Procedure Laterality Date  . Partial hysterectomy  1990's    bleeding  . Knee surgery      left  . Gastric bypass  08/2003  . Hernia repair    . Abdominal hysterectomy    . Tubal ligation    .  Radioactive seed guided mastectomy with axillary sentinel lymph node biopsy Right 03/09/2015    Procedure: RADIOACTIVE SEED GUIDED RIGHT PARTIAL MASTECTOMY WITH AXILLARY SENTINEL LYMPH NODE BIOPSY;  Surgeon: Stark Klein, MD;  Location: [place] [facility];  Service: General;  Laterality: Right;   Social History  Substance Use Topics  . Smoking status: Never Smoker   . Smokeless tobacco: Never Used  . Alcohol Use: No   Family History  Problem Relation Age of Onset  . Heart disease Mother     MI  . Hypertension Mother   . Alcohol abuse Father   . Heart disease Father     smoker  . Hypertension Father   . Breast cancer Paternal Grandmother   . Diabetes Maternal Aunt    Allergies  Allergen Reactions  . Crestor [Rosuvastatin Calcium] Other (See Comments)    Myalgia   . Lipitor [Atorvastatin Calcium] Other (See Comments)    Aches and pains  . Penicillins Shortness Of Breath and Swelling  . Zocor [Simvastatin - High Dose]  Other (See Comments)    Muscle/joint aches   Current Outpatient Prescriptions on File Prior to Visit  Medication Sig Dispense Refill  . BIOTIN PO Take 1 tablet by mouth daily.    Marland Kitchen CALCIUM PO Take by mouth daily.      . cholecalciferol (VITAMIN D) 400 UNITS TABS Take 400 Units by mouth daily.     . cyanocobalamin (,VITAMIN B-12,) 1000 MCG/ML injection Inject 1,000 mcg into the muscle once. Every 2 months     . gabapentin (NEURONTIN) 300 MG capsule Take 1 capsule (300 mg total) by mouth at bedtime. 90 capsule 4  . Multiple Vitamin (MULTIVITAMIN) capsule Take 1 capsule by mouth daily.      Marland Kitchen venlafaxine XR (EFFEXOR-XR) 75 MG 24 hr capsule Take 1 capsule (75 mg total) by mouth daily with breakfast. 30 capsule 5   No current facility-administered medications on file prior to visit.    Review of Systems Review of Systems  Constitutional: Negative for fever, appetite change, and unexpected weight change.  Eyes: Negative for pain and visual disturbance.  Respiratory: Negative for cough and shortness of breath.   Cardiovascular: Negative for cp or palpitations    Gastrointestinal: Negative for nausea, diarrhea and constipation.  Genitourinary: Negative for urgency and frequency. pos for hot flashes  Skin: Negative for pallor or rash   Neurological: Negative for weakness, light-headedness, numbness and headaches.  Hematological: Negative for adenopathy. Does not bruise/bleed easily.  Psychiatric/Behavioral: Negative for dysphoric mood. The patient is not nervous/anxious.         Objective:   Physical Exam  Constitutional: She appears well-developed and well-nourished. No distress.  obese and well appearing   HENT:  Head: Normocephalic and atraumatic.  Mouth/Throat: Oropharynx is clear and moist.  Eyes: Conjunctivae and EOM are normal. Pupils are equal, round, and reactive to light.  Neck: Normal range of motion. Neck supple. No JVD present. Carotid bruit is not present. No thyromegaly  present.  Cardiovascular: Normal rate, regular rhythm, normal heart sounds and intact distal pulses.  Exam reveals no gallop.   Pulmonary/Chest: Effort normal and breath sounds normal. No respiratory distress. She has no wheezes. She has no rales.  No crackles  Abdominal: Soft. Bowel sounds are normal. She exhibits no distension, no abdominal bruit and no mass. There is no tenderness.  Musculoskeletal: She exhibits no edema.  No kyphosis   Lymphadenopathy:    She has no cervical adenopathy.  Neurological: She  is alert. She has normal reflexes.  Skin: Skin is warm and dry. No rash noted.  Psychiatric: She has a normal mood and affect.          Assessment & Plan:   Problem List Items Addressed This Visit      Cardiovascular and Mediastinum   Essential hypertension - Primary    bp in fair control at this time  BP Readings from Last 1 Encounters:  01/11/16 128/70   No changes needed Disc lifstyle change with low sodium diet and exercise  (better on 2nd check) Reviewed labs from May  Refilled hctz      Relevant Medications   hydrochlorothiazide (HYDRODIURIL) 25 MG tablet     Digestive   GERD    Refilled omeprazole Pt has severe symptoms w/o it  Disc diet  Also working on wt loss  Schedule dexa also      Relevant Medications   omeprazole (PRILOSEC) 20 MG capsule     Other   OBESITY    Pt has lost 5 lb   Discussed how this problem influences overall health and the risks it imposes  Reviewed plan for weight loss with lower calorie diet (via better food choices and also portion control or program like weight watchers) and exercise building up to or more than 30 minutes 5 days per week including some aerobic activity   Enc further exercise as tolerated      Estrogen deficiency    In pt on PPI and tamoxifen  Schedule dexa       Relevant Orders   DG Bone Density   Breast cancer of upper-inner quadrant of right female breast ([place])    Pt reports she is doing very  well and tolerating tamoxifen  We will schedule dexa this summer       Relevant Medications   tamoxifen (NOLVADEX) 20 MG tablet   loratadine (CLARITIN) 10 MG tablet

## 2016-01-11 NOTE — Assessment & Plan Note (Signed)
In pt on PPI and tamoxifen  Schedule dexa

## 2016-01-11 NOTE — Patient Instructions (Addendum)
Please stop at check out to schedule annual exam in 6 month and bone density test Take care of yourself Look at the handout regarding a tetanus shot

## 2016-01-11 NOTE — Assessment & Plan Note (Signed)
Pt has lost 5 lb   Discussed how this problem influences overall health and the risks it imposes  Reviewed plan for weight loss with lower calorie diet (via better food choices and also portion control or program like weight watchers) and exercise building up to or more than 30 minutes 5 days per week including some aerobic activity   Enc further exercise as tolerated

## 2016-01-11 NOTE — Assessment & Plan Note (Signed)
Pt reports she is doing very well and tolerating tamoxifen  We will schedule dexa this summer

## 2016-01-11 NOTE — Progress Notes (Signed)
Pre visit review using our clinic review tool, if applicable. No additional management support is needed unless otherwise documented below in the visit note. 

## 2016-01-11 NOTE — Assessment & Plan Note (Signed)
bp in fair control at this time  BP Readings from Last 1 Encounters:  01/11/16 128/70   No changes needed Disc lifstyle change with low sodium diet and exercise  (better on 2nd check) Reviewed labs from May  Refilled hctz

## 2016-01-11 NOTE — Assessment & Plan Note (Signed)
Refilled omeprazole Pt has severe symptoms w/o it  Disc diet  Also working on wt loss  Schedule dexa also

## 2016-01-26 ENCOUNTER — Ambulatory Visit
Admission: RE | Admit: 2016-01-26 | Discharge: 2016-01-26 | Disposition: A | Discharge status: Home / Self Care | Payer: Medicare Other | Source: Ambulatory Visit | Attending: Family Medicine | Admitting: Family Medicine

## 2016-01-26 ENCOUNTER — Encounter: Payer: Self-pay | Admitting: *Deleted

## 2016-01-26 DIAGNOSIS — Z13820 Encounter for screening for osteoporosis: Secondary | ICD-10-CM | POA: Diagnosis not present

## 2016-01-26 DIAGNOSIS — Z780 Asymptomatic menopausal state: Secondary | ICD-10-CM | POA: Diagnosis not present

## 2016-01-26 DIAGNOSIS — E2839 Other primary ovarian failure: Secondary | ICD-10-CM

## 2016-01-26 LAB — HM DEXA SCAN: HM Dexa Scan: NORMAL

## 2016-01-30 LAB — HM MAMMOGRAPHY: HM Mammogram: NORMAL (ref 0–4)

## 2016-05-03 ENCOUNTER — Ambulatory Visit (HOSPITAL_BASED_OUTPATIENT_CLINIC_OR_DEPARTMENT_OTHER): Payer: Medicare Other | Admitting: Oncology

## 2016-05-03 ENCOUNTER — Other Ambulatory Visit (HOSPITAL_BASED_OUTPATIENT_CLINIC_OR_DEPARTMENT_OTHER): Payer: Medicare Other

## 2016-05-03 VITALS — BP 162/59 | HR 70 | Temp 97.8°F | Resp 18 | Ht 65.0 in | Wt 198.8 lb

## 2016-05-03 DIAGNOSIS — Z79810 Long term (current) use of selective estrogen receptor modulators (SERMs): Secondary | ICD-10-CM

## 2016-05-03 DIAGNOSIS — Z170 Estrogen receptor positive status [ER+]: Secondary | ICD-10-CM

## 2016-05-03 DIAGNOSIS — C50211 Malignant neoplasm of upper-inner quadrant of right female breast: Secondary | ICD-10-CM

## 2016-05-03 LAB — COMPREHENSIVE METABOLIC PANEL
ALBUMIN: 3.2 g/dL — AB (ref 3.5–5.0)
ALK PHOS: 82 U/L (ref 40–150)
ALT: 10 U/L (ref 0–55)
AST: 16 U/L (ref 5–34)
Anion Gap: 8 mEq/L (ref 3–11)
BUN: 11.7 mg/dL (ref 7.0–26.0)
CALCIUM: 8.8 mg/dL (ref 8.4–10.4)
CHLORIDE: 100 meq/L (ref 98–109)
CO2: 30 mEq/L — ABNORMAL HIGH (ref 22–29)
Creatinine: 0.8 mg/dL (ref 0.6–1.1)
EGFR: 78 mL/min/{1.73_m2} — AB (ref >90)
Glucose: 66 mg/dl — ABNORMAL LOW (ref 70–140)
POTASSIUM: 4 meq/L (ref 3.5–5.1)
Sodium: 137 mEq/L (ref 136–145)
Total Bilirubin: <0.3 mg/dL (ref 0.20–1.20)
Total Protein: 7.1 g/dL (ref 6.4–8.3)

## 2016-05-03 LAB — CBC WITH DIFFERENTIAL/PLATELET
BASO%: 0.6 % (ref 0.0–2.0)
BASOS ABS: 0 10*3/uL (ref 0.0–0.1)
EOS%: 3.6 % (ref 0.0–7.0)
Eosinophils Absolute: 0.2 10*3/uL (ref 0.0–0.5)
HEMATOCRIT: 34.4 % — AB (ref 34.8–46.6)
HGB: 11.3 g/dL — ABNORMAL LOW (ref 11.6–15.9)
LYMPH%: 21.8 % (ref 14.0–49.7)
MCH: 29.3 pg (ref 25.1–34.0)
MCHC: 32.8 g/dL (ref 31.5–36.0)
MCV: 89.1 fL (ref 79.5–101.0)
MONO#: 0.6 10*3/uL (ref 0.1–0.9)
MONO%: 9 % (ref 0.0–14.0)
NEUT#: 4.2 10*3/uL (ref 1.5–6.5)
NEUT%: 65 % (ref 38.4–76.8)
PLATELETS: 299 10*3/uL (ref 145–400)
RBC: 3.86 10*6/uL (ref 3.70–5.45)
RDW: 14.6 % — ABNORMAL HIGH (ref 11.2–14.5)
WBC: 6.5 10*3/uL (ref 3.9–10.3)
lymph#: 1.4 10*3/uL (ref 0.9–3.3)

## 2016-05-03 MED ORDER — TAMOXIFEN CITRATE 20 MG PO TABS: 20.0000 mg | ORAL_TABLET | Freq: Every day | ORAL | 4 refills | Status: DC

## 2016-05-03 MED ORDER — VENLAFAXINE HCL ER 150 MG PO CP24: 150.0000 mg | ORAL_CAPSULE | Freq: Every day | ORAL | 4 refills | Status: DC

## 2016-05-03 NOTE — Progress Notes (Signed)
Jennifer Bartlett  [phone] [phone]     ID: Jennifer Bartlett DOB: 03-06-1946  MR#: [id number]  XIP#:[id number]  Patient Care Team: Abner Greenspan, MD as PCP - General Huel Cote, NP as Nurse Practitioner (Obstetrics and Gynecology) Stark Klein, MD as Consulting Physician (General Surgery) Chauncey Cruel, MD as Consulting Physician (Oncology) Arloa Koh, MD as Consulting Physician (Radiation Oncology) Mauro Kaufmann, RN as Registered Nurse Rockwell Germany, RN as Registered Nurse Sylvan Cheese, NP as Nurse Practitioner (Nurse Practitioner) PCP: Loura Pardon, MD  OTHER MD:  CHIEF COMPLAINT: Estrogen receptor positive breast cancer  CURRENT TREATMENT:  TAMOXIFEN   BREAST CANCER HISTORY:  From the original intake note:  Carisa underwent bilateral screening mammography [facility]. This showed a possible mass in the right breast. She was recalled for diagnostic right mammography with tomosynthesis and right breast ultrasonography 02/17/2015. This found the breast density to be category C. In the right breast upper inner quadrant there was a persistent poorly marginated nodule which was not palpable. By ultrasound this measured 0.5 cm. There was no evidence of right axillary lymphadenopathy.  Biopsy of this mass 02/24/2015 showed ([id number]) an invasive lobular breast cancer, E-cadherin negative, grade 1, estrogen receptor 95% positive, progesterone receptor 60% positive, both with strong staining intensity, with an MIB-1 of 3%, and no HER-2 amplification, the signals ratio being 1.29 and the number per cell 2.00.  Her subsequent history is as detailed below  INTERVAL HISTORY: Jennifer Bartlett returns today for follow-up of her breast cancer. The interval history is generally unremarkable. She now has a third blood grandchild, a granddaughter who is premature and weight 12 ounces at birth, but is doing well"  is a Nurse, adult".  Jennifer Bartlett continues on tamoxifen. She obtains it at a very good price. She hasn't vaginal discharge which is not too bothersome. The nighttime hot flashes are controlled with gabapentin at bedtime. The daytime hot flashes are not well-controlled on her current dose of venlafaxine.  REVIEW OF SYSTEMS: She has a little bit of a runny nose, some stress urinary incontinence, easy bruising, and joint pains which are not more intense or persistent than before. Otherwise a detailed review of systems today was noncontributory  PAST MEDICAL HISTORY: Past Medical History:  Diagnosis Date  . Anemia   . Anxiety   . 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 GERD (gastroesophageal reflux disease)   . Hot flashes   . Hyperlipidemia   . Hypertension   . Hypoglycemia   . Obesity   . Vitamin B 12 deficiency     PAST SURGICAL HISTORY: Past Surgical History:  Procedure Laterality Date  . ABDOMINAL HYSTERECTOMY    . GASTRIC BYPASS  08/2003  . HERNIA REPAIR    . KNEE SURGERY     left  . PARTIAL HYSTERECTOMY  1990's   bleeding  . RADIOACTIVE SEED GUIDED MASTECTOMY WITH AXILLARY SENTINEL LYMPH NODE BIOPSY Right 03/09/2015   Procedure: RADIOACTIVE SEED GUIDED RIGHT PARTIAL MASTECTOMY WITH AXILLARY SENTINEL LYMPH NODE BIOPSY;  Surgeon: Stark Klein, MD;  Location: [facility];  Service: General;  Laterality: Right;  . TUBAL LIGATION      FAMILY HISTORY Family History  Problem Relation Age of Onset  . Heart disease Mother     MI  . Hypertension Mother   . Alcohol abuse Father   . Heart disease Father  smoker  . Hypertension Father   . Breast cancer Paternal Grandmother   . Diabetes Maternal Aunt    the patient's father died at the age of 46 and her mother at the age of 43, both from heart attacks in the setting of tobacco abuse. The patient has 3 brothers, one sister. The patient's paternal grandmother may have been diagnosed with  breast cancer in her late 66s, but that is not certain. There is no other history of breast or ovarian cancer in the family to the patient's knowledge.  GYNECOLOGIC HISTORY:  Patient's last menstrual period was 06/08/1989. Menarche age 20, first live birth age 36; the patient is [place] P2. She underwent hysterectomy without salpingo-oophorectomy more than 20 years ago. She has been on hormone replacement more than 20 years. She understands this does increase the risk of developing breast cancer. This was stopped July 2016.  SOCIAL HISTORY:  Jennifer Bartlett used to work for [employer] but is now retired. Her husband Jennifer Bartlett (goes by Jennifer Bartlett) worked 70 years for [employer]. The patient's daughter Jennifer Bartlett lives in [place] and is Mudlogger of the afterschool program in [place]. The patient's son Jennifer Bartlett lives in [place] and works for [employer], a company that cleans up after disaster or certain crimes. The patient also has an adopted daughter with knee Jennifer Bartlett who lives in [place] and is a homemaker. The patient has 2 blood grandchildren and 2 adopted grandchildren. At home its the patient and her husband, her daughter and her husband and 2 grandchildren. Lecretia attends a local [employer] DIRECTIVES: Not in place   HEALTH MAINTENANCE: Social History  Substance Use Topics  . Smoking status: Never Smoker  . Smokeless tobacco: Never Used  . Alcohol use No     Colonoscopy: Never (status post bypass surgery)  PAP: 2015  Bone density: c. 2012; normal  Lipid panel:  Allergies  Allergen Reactions  . Crestor [Rosuvastatin Calcium] Other (See Comments)    Myalgia   . Lipitor [Atorvastatin Calcium] Other (See Comments)    Aches and pains  . Penicillins Shortness Of Breath and Swelling  . Zocor [Simvastatin - High Dose] Other (See Comments)    Muscle/joint aches    Current Outpatient Prescriptions  Medication Sig Dispense Refill  . BIOTIN PO Take 1  tablet by mouth daily.    Marland Kitchen CALCIUM PO Take by mouth daily.      . cholecalciferol (VITAMIN D) 400 UNITS TABS Take 400 Units by mouth daily.     . cyanocobalamin (,VITAMIN B-12,) 1000 MCG/ML injection Inject 1,000 mcg into the muscle once. Every 2 months     . gabapentin (NEURONTIN) 300 MG capsule Take 1 capsule (300 mg total) by mouth at bedtime. 90 capsule 4  . hydrochlorothiazide (HYDRODIURIL) 25 MG tablet Take 1 tablet (25 mg total) by mouth daily. 90 tablet 3  . loratadine (CLARITIN) 10 MG tablet Take 10 mg by mouth daily.    . Multiple Vitamin (MULTIVITAMIN) capsule Take 1 capsule by mouth daily.      Marland Kitchen omeprazole (PRILOSEC) 20 MG capsule TAKE 1 CAPSULE (20 MG TOTAL) BY MOUTH DAILY. 90 capsule 3  . tamoxifen (NOLVADEX) 20 MG tablet Take 1 tablet (20 mg total) by mouth daily. 90 tablet 4  . venlafaxine XR (EFFEXOR-XR) 150 MG 24 hr capsule Take 1 capsule (150 mg total) by mouth daily with breakfast. 90 capsule 4   No current facility-administered medications for this visit.  OBJECTIVE: Middle-aged white woman  In no acute distress Vitals:   05/03/16 1121  BP: (!) 162/59  Pulse: 70  Resp: 18  Temp: 97.8 F (36.6 C)     Body mass index is 33.08 kg/m.    ECOG FS:1 - Symptomatic but completely ambulatory  Sclerae unicteric, EOMs intact Oropharynx clear and moist No cervical or supraclavicular adenopathy Lungs no rales or rhonchi Heart regular rate and rhythm Abd soft, obese, nontender, positive bowel sounds MSK no focal spinal tenderness, no upper extremity lymphedema Neuro: nonfocal, well oriented, appropriate affect Breasts: The right breast is status post lumpectomy and radiation. There is the expected skin thickening, but no evidence of disease recurrence. The right axilla is benign. Left breast is unremarkable.   LAB RESULTS:  CMP     Component Value Date/Time   NA 141 12/01/2015 1050   K 3.6 12/01/2015 1050   CL 101 08/17/2014 0838   CO2 30 (H) 12/01/2015 1050    GLUCOSE 81 12/01/2015 1050   BUN 15.6 12/01/2015 1050   CREATININE 0.8 12/01/2015 1050   CALCIUM 9.0 12/01/2015 1050   PROT 6.9 12/01/2015 1050   ALBUMIN 3.5 12/01/2015 1050   AST 16 12/01/2015 1050   ALT 16 12/01/2015 1050   ALKPHOS 62 12/01/2015 1050   BILITOT 0.33 12/01/2015 1050   GFRNONAA 99.73 12/07/2009 0911   GFRAA 94 05/22/2008 0847    INo results found for: SPEP, UPEP  Lab Results  Component Value Date   WBC 6.5 05/03/2016   NEUTROABS 4.2 05/03/2016   HGB 11.3 (L) 05/03/2016   HCT 34.4 (L) 05/03/2016   MCV 89.1 05/03/2016   PLT 299 05/03/2016      Chemistry      Component Value Date/Time   NA 141 12/01/2015 1050   K 3.6 12/01/2015 1050   CL 101 08/17/2014 0838   CO2 30 (H) 12/01/2015 1050   BUN 15.6 12/01/2015 1050   CREATININE 0.8 12/01/2015 1050      Component Value Date/Time   CALCIUM 9.0 12/01/2015 1050   ALKPHOS 62 12/01/2015 1050   AST 16 12/01/2015 1050   ALT 16 12/01/2015 1050   BILITOT 0.33 12/01/2015 1050       No results found for: LABCA2  No components found for: LABCA125  No results for input(s): INR in the last 168 hours.  Urinalysis No results found for: COLORURINE, APPEARANCEUR, LABSPEC, [place], GLUCOSEU, HGBUR, BILIRUBINUR, KETONESUR, PROTEINUR, UROBILINOGEN, NITRITE, LEUKOCYTESUR  STUDIES: DUAL X-RAY ABSORPTIOMETRY (DXA) FOR BONE MINERAL DENSITY 01/26/2016  IMPRESSION: Referring Physician:  MARNE A TOWER  PATIENT: Name: Jennifer Bartlett, Jennifer Bartlett Patient ID: [id number] Birth Date: 11/05/1945 Height: 65.0 in. Sex: Female Measured: 01/26/2016 Weight: 195.0 lbs. Indications: Breast Cancer History, Effexor, Estrogen Deficient, Gabapentin, Hysterectomy, Postmenopausal, Prilosec Fractures: Toe Treatments: Calcium (E943.0), ERT (V07.4), Vitamin D (E933.5)  ASSESSMENT: The BMD measured at Femur Neck Right is 0.986 g/cm2 with a T-score of -0.4. This patient is considered normal according to [facility]) criteria.  L-3 was excluded due to degenerative changes.  (Mammography at the breast Center in July was reportedly negative. The actual record is not crossing over area)  ASSESSMENT: 70 y.o. [place], [place] woman status post right breast Upper inner quadrant biopsy 02/24/2015 for a clinical T1a N0, stage IA invasive lobular carcinoma, grade 1, estrogen and progesterone receptor strongly positive, HER-2 not amplified, with an MIB-1 of 3%  (1)  Right lumpectomy and sentinel lymph node sampling 03/09/2015 showed a pT1b pN0, stage IA  Invasive  lobular carcinoma, grade 2 , repeat HER-2 again negative  (2) adjuvant radiation 04/07/2015 through 05/13/2015: Right breast 4860 cGy in 27 sessions, no boost    (3)  started tamoxifen in January 2017  (a)  Status post hysterectomy without salpingo-oophorectomy remotely  PLAN:  Jennifer Bartlett is now a year out from definitive surgery for her breast cancer with no evidence of disease recurrence. This is favorable.  She is tolerating the tamoxifen generally well. It is causing her some vaginal wetness which she does not mind. The hot flashes are more of a problem.  We're going to do 2 things about that. First we are increasing the venlafaxine 250 mg daily. Secondly she is going to start taking the tamoxifen at that time. Hopefully that will alleviate the problem, but if it does not she will call and let me know.  At this point I'm comfortable seeing her on a once a year basis. Since she gets her mammography in July I will start seeing her in September. The plan is to continue tamoxifen for total of 5 years.  She knows to call for any problems that may develop before her next visit    .Chauncey Cruel, MD   05/03/2016 11:31 AM Medical Oncology and Hematology [facility] [address] [place], [place] [address] Tel. [phone]    Fax. [phone]

## 2016-06-26 ENCOUNTER — Other Ambulatory Visit: Payer: Self-pay

## 2016-06-26 DIAGNOSIS — C50211 Malignant neoplasm of upper-inner quadrant of right female breast: Secondary | ICD-10-CM | POA: Diagnosis not present

## 2016-06-26 DIAGNOSIS — R232 Flushing: Secondary | ICD-10-CM | POA: Diagnosis not present

## 2016-06-26 DIAGNOSIS — J3489 Other specified disorders of nose and nasal sinuses: Secondary | ICD-10-CM | POA: Diagnosis not present

## 2016-06-26 DIAGNOSIS — T451X5A Adverse effect of antineoplastic and immunosuppressive drugs, initial encounter: Secondary | ICD-10-CM | POA: Diagnosis not present

## 2016-06-26 MED ORDER — CLONIDINE HCL 0.1 MG/24HR TD PTWK: 0.1000 mg | MEDICATED_PATCH | TRANSDERMAL | 12 refills | Status: DC

## 2016-06-26 NOTE — Progress Notes (Signed)
LMOVM - Rx sent to [facility].

## 2016-06-29 ENCOUNTER — Telehealth: Payer: Self-pay | Admitting: Family Medicine

## 2016-06-29 DIAGNOSIS — I10 Essential (primary) hypertension: Secondary | ICD-10-CM

## 2016-06-29 DIAGNOSIS — Z1159 Encounter for screening for other viral diseases: Secondary | ICD-10-CM

## 2016-06-29 DIAGNOSIS — E559 Vitamin D deficiency, unspecified: Secondary | ICD-10-CM

## 2016-06-29 DIAGNOSIS — E7800 Pure hypercholesterolemia, unspecified: Secondary | ICD-10-CM

## 2016-06-29 DIAGNOSIS — E538 Deficiency of other specified B group vitamins: Secondary | ICD-10-CM

## 2016-06-29 NOTE — Telephone Encounter (Signed)
-----   Message from Eustace Pen, LPN sent at [id number]  4:23 PM EST ----- Regarding: Lab Orders 06/30/16 Please add Hep C to lab orders, if any. Thank you .

## 2016-06-30 ENCOUNTER — Other Ambulatory Visit: Payer: Medicare Other

## 2016-06-30 ENCOUNTER — Ambulatory Visit (INDEPENDENT_AMBULATORY_CARE_PROVIDER_SITE_OTHER): Payer: Medicare Other

## 2016-06-30 VITALS — BP 152/80 | HR 68 | Temp 98.2°F | Ht 63.5 in | Wt 194.5 lb

## 2016-06-30 DIAGNOSIS — E7800 Pure hypercholesterolemia, unspecified: Secondary | ICD-10-CM

## 2016-06-30 DIAGNOSIS — I10 Essential (primary) hypertension: Secondary | ICD-10-CM | POA: Diagnosis not present

## 2016-06-30 DIAGNOSIS — Z0000 Encounter for general adult medical examination without abnormal findings: Secondary | ICD-10-CM

## 2016-06-30 DIAGNOSIS — E538 Deficiency of other specified B group vitamins: Secondary | ICD-10-CM | POA: Diagnosis not present

## 2016-06-30 DIAGNOSIS — E559 Vitamin D deficiency, unspecified: Secondary | ICD-10-CM

## 2016-06-30 DIAGNOSIS — Z1159 Encounter for screening for other viral diseases: Secondary | ICD-10-CM

## 2016-06-30 LAB — LIPID PANEL
Cholesterol: 208 mg/dL — ABNORMAL HIGH (ref 0–200)
HDL: 54.7 mg/dL (ref >39.00)
LDL Cholesterol: 134 mg/dL — ABNORMAL HIGH (ref 0–99)
NONHDL: 153.41
TRIGLYCERIDES: 99 mg/dL (ref 0.0–149.0)
Total CHOL/HDL Ratio: 4
VLDL: 19.8 mg/dL (ref 0.0–40.0)

## 2016-06-30 LAB — CBC WITH DIFFERENTIAL/PLATELET
Basophils Absolute: 0.1 10*3/uL (ref 0.0–0.1)
Basophils Relative: 0.7 % (ref 0.0–3.0)
EOS ABS: 0.3 10*3/uL (ref 0.0–0.7)
Eosinophils Relative: 3.8 % (ref 0.0–5.0)
HCT: 34.8 % — ABNORMAL LOW (ref 36.0–46.0)
Hemoglobin: 11.6 g/dL — ABNORMAL LOW (ref 12.0–15.0)
LYMPHS ABS: 1.5 10*3/uL (ref 0.7–4.0)
Lymphocytes Relative: 21.7 % (ref 12.0–46.0)
MCHC: 33.3 g/dL (ref 30.0–36.0)
MCV: 87.7 fl (ref 78.0–100.0)
Monocytes Absolute: 0.5 10*3/uL (ref 0.1–1.0)
Monocytes Relative: 6.5 % (ref 3.0–12.0)
NEUTROS ABS: 4.7 10*3/uL (ref 1.4–7.7)
NEUTROS PCT: 67.3 % (ref 43.0–77.0)
PLATELETS: 293 10*3/uL (ref 150.0–400.0)
RBC: 3.97 Mil/uL (ref 3.87–5.11)
RDW: 14.7 % (ref 11.5–15.5)
WBC: 7 10*3/uL (ref 4.0–10.5)

## 2016-06-30 LAB — COMPREHENSIVE METABOLIC PANEL
ALT: 12 U/L (ref 0–35)
AST: 15 U/L (ref 0–37)
Albumin: 3.8 g/dL (ref 3.5–5.2)
Alkaline Phosphatase: 70 U/L (ref 39–117)
BILIRUBIN TOTAL: 0.3 mg/dL (ref 0.2–1.2)
BUN: 10 mg/dL (ref 6–23)
CO2: 31 meq/L (ref 19–32)
CREATININE: 0.71 mg/dL (ref 0.40–1.20)
Calcium: 9 mg/dL (ref 8.4–10.5)
Chloride: 100 mEq/L (ref 96–112)
GFR: 86.7 mL/min (ref >60.00)
GLUCOSE: 107 mg/dL — AB (ref 70–99)
Potassium: 4 mEq/L (ref 3.5–5.1)
Sodium: 139 mEq/L (ref 135–145)
Total Protein: 6.8 g/dL (ref 6.0–8.3)

## 2016-06-30 LAB — TSH: TSH: 2.76 u[IU]/mL (ref 0.35–4.50)

## 2016-06-30 LAB — VITAMIN B12: VITAMIN B 12: 1035 pg/mL — AB (ref 211–911)

## 2016-06-30 LAB — VITAMIN D 25 HYDROXY (VIT D DEFICIENCY, FRACTURES): VITD: 23.71 ng/mL — AB (ref 30.00–100.00)

## 2016-06-30 NOTE — Progress Notes (Signed)
Pre visit review using our clinic review tool, if applicable. No additional management support is needed unless otherwise documented below in the visit note.     I reviewed health advisor's note, was available for consultation, and agree with documentation and plan. Loura Pardon MD

## 2016-06-30 NOTE — Patient Instructions (Signed)
**Please contact insurance about coverage regarding tetanus vaccine prior to next appointment with PCP. Thank you.**  Jennifer Bartlett , Thank you for taking time to come for your Medicare Wellness Visit. I appreciate your ongoing commitment to your health goals. Please review the following plan we discussed and let me know if I can assist you in the future.   These are the goals we discussed: Goals    . Increase physical activity          Starting 06/30/2016, I will continue to walk at least 20 min 2-3 days per week.        This is a list of the screening recommended for you and due dates:  Health Maintenance  Topic Date Due  . Colon Cancer Screening  09/01/2020*  . Tetanus Vaccine  11/16/2024*  . Mammogram  02/23/2017  . Flu Shot  Addressed  . DEXA scan (bone density measurement)  Completed  . Shingles Vaccine  Completed  .  Hepatitis C: One time screening is recommended by Center for Disease Control  (CDC) for  adults born from [birth year] through 1964.   Completed  . Pneumonia vaccines  Completed  *Topic was postponed. The date shown is not the original due date.   Preventive Care for Adults  A healthy lifestyle and preventive care can promote health and wellness. Preventive health guidelines for adults include the following key practices.  . A routine yearly physical is a good way to check with your health care provider about your health and preventive screening. It is a chance to share any concerns and updates on your health and to receive a thorough exam.  . Visit your dentist for a routine exam and preventive care every 6 months. Brush your teeth twice a day and floss once a day. Good oral hygiene prevents tooth decay and gum disease.  . The frequency of eye exams is based on your age, health, family medical history, use  of contact lenses, and other factors. Follow your health care provider's ecommendations for frequency of eye exams.  . Eat a healthy diet. Foods like vegetables,  fruits, whole grains, low-fat dairy products, and lean protein foods contain the nutrients you need without too many calories. Decrease your intake of foods high in solid fats, added sugars, and salt. Eat the right amount of calories for you. Get information about a proper diet from your health care provider, if necessary.  . Regular physical exercise is one of the most important things you can do for your health. Most adults should get at least 150 minutes of moderate-intensity exercise (any activity that increases your heart rate and causes you to sweat) each week. In addition, most adults need muscle-strengthening exercises on 2 or more days a week.  Silver Sneakers may be a benefit available to you. To determine eligibility, you may visit the website: www.silversneakers.com or contact program at 202-858-3764 Mon-Fri between 8AM-8PM.   . Maintain a healthy weight. The body mass index (BMI) is a screening tool to identify possible weight problems. It provides an estimate of body fat based on height and weight. Your health care provider can find your BMI and can help you achieve or maintain a healthy weight.   For adults 20 years and older: ? A BMI below 18.5 is considered underweight. ? A BMI of 18.5 to 24.9 is normal. ? A BMI of 25 to 29.9 is considered overweight. ? A BMI of 30 and above is considered obese.   . Maintain  normal blood lipids and cholesterol levels by exercising and minimizing your intake of saturated fat. Eat a balanced diet with plenty of fruit and vegetables. Blood tests for lipids and cholesterol should begin at age 52 and be repeated every 5 years. If your lipid or cholesterol levels are high, you are over 50, or you are at high risk for heart disease, you may need your cholesterol levels checked more frequently. Ongoing high lipid and cholesterol levels should be treated with medicines if diet and exercise are not working.  . If you smoke, find out from your health care  provider how to quit. If you do not use tobacco, please do not start.  . If you choose to drink alcohol, please do not consume more than 2 drinks per day. One drink is considered to be 12 ounces (355 mL) of beer, 5 ounces (148 mL) of wine, or 1.5 ounces (44 mL) of liquor.  . If you are 48-64 years old, ask your health care provider if you should take aspirin to prevent strokes.  . Use sunscreen. Apply sunscreen liberally and repeatedly throughout the day. You should seek shade when your shadow is shorter than you. Protect yourself by wearing long sleeves, pants, a wide-brimmed hat, and sunglasses year round, whenever you are outdoors.  . Once a month, do a whole body skin exam, using a mirror to look at the skin on your back. Tell your health care provider of new moles, moles that have irregular borders, moles that are larger than a pencil eraser, or moles that have changed in shape or color.

## 2016-07-01 LAB — HEPATITIS C ANTIBODY: HCV AB: NEGATIVE

## 2016-07-04 ENCOUNTER — Encounter: Payer: Self-pay | Admitting: Family Medicine

## 2016-07-04 ENCOUNTER — Ambulatory Visit (INDEPENDENT_AMBULATORY_CARE_PROVIDER_SITE_OTHER): Payer: Medicare Other | Admitting: Family Medicine

## 2016-07-04 VITALS — BP 134/86 | HR 68 | Temp 98.3°F | Ht 63.5 in | Wt 196.5 lb

## 2016-07-04 DIAGNOSIS — R739 Hyperglycemia, unspecified: Secondary | ICD-10-CM

## 2016-07-04 DIAGNOSIS — C50211 Malignant neoplasm of upper-inner quadrant of right female breast: Secondary | ICD-10-CM

## 2016-07-04 DIAGNOSIS — I10 Essential (primary) hypertension: Secondary | ICD-10-CM

## 2016-07-04 DIAGNOSIS — Z0000 Encounter for general adult medical examination without abnormal findings: Secondary | ICD-10-CM | POA: Diagnosis not present

## 2016-07-04 DIAGNOSIS — E538 Deficiency of other specified B group vitamins: Secondary | ICD-10-CM

## 2016-07-04 DIAGNOSIS — Z6834 Body mass index (BMI) 34.0-34.9, adult: Secondary | ICD-10-CM

## 2016-07-04 DIAGNOSIS — Z23 Encounter for immunization: Secondary | ICD-10-CM

## 2016-07-04 DIAGNOSIS — E6609 Other obesity due to excess calories: Secondary | ICD-10-CM

## 2016-07-04 DIAGNOSIS — R7303 Prediabetes: Secondary | ICD-10-CM | POA: Insufficient documentation

## 2016-07-04 DIAGNOSIS — Z1211 Encounter for screening for malignant neoplasm of colon: Secondary | ICD-10-CM

## 2016-07-04 DIAGNOSIS — Z170 Estrogen receptor positive status [ER+]: Secondary | ICD-10-CM

## 2016-07-04 DIAGNOSIS — E7800 Pure hypercholesterolemia, unspecified: Secondary | ICD-10-CM

## 2016-07-04 DIAGNOSIS — Z1159 Encounter for screening for other viral diseases: Secondary | ICD-10-CM

## 2016-07-04 DIAGNOSIS — E559 Vitamin D deficiency, unspecified: Secondary | ICD-10-CM | POA: Diagnosis not present

## 2016-07-04 MED ORDER — TETANUS-DIPHTH-ACELL PERTUSSIS 5-2.5-18.5 LF-MCG/0.5 IM SUSP
0.5000 mL | Freq: Once | INTRAMUSCULAR | Status: AC
  Administered 2016-07-04: 0.5 mL via INTRAMUSCULAR

## 2016-07-04 NOTE — Progress Notes (Signed)
Pre visit review using our clinic review tool, if applicable. No additional management support is needed unless otherwise documented below in the visit note. 

## 2016-07-04 NOTE — Patient Instructions (Addendum)
We will sign you up for the cologuard program for colon cancer screening  Tdap vaccine today  Buy vitamin D3 over the counter and take 4000 iu daily in addition to what you are already taking   LDL is up a bit (your bad cholesterol) Avoid red meat/ fried foods/ egg yolks/ fatty breakfast meats/ butter, cheese and high fat dairy/ and shellfish    Fasting glucose is up a bit  Watch the carbs and sugars as much as possible

## 2016-07-04 NOTE — Assessment & Plan Note (Signed)
Neg screening

## 2016-07-04 NOTE — Assessment & Plan Note (Signed)
Disc goals for lipids and reasons to control them Rev labs with pt Rev low sat fat diet in detail  Labs rev  LDL is slowly on the rise Rev diet in detail - red sat and trans fats and also handout given

## 2016-07-04 NOTE — Assessment & Plan Note (Signed)
Reviewed health habits including diet and exercise and skin cancer prevention Reviewed appropriate screening tests for age  Also reviewed health mt list, fam hx and immunization status , as well as social and family history   See HPI AMW reviewed Labs reviewed Enc wt loss and low fat/carb diet  Tdap today (around a new baby)  Doing well with breast cancer f/u  Inc vit D by 4000 iu daily for bone and overall health

## 2016-07-04 NOTE — Assessment & Plan Note (Signed)
Not candidate for colonoscopy in light of prev GI surgery  Ordered cologuard kit for her  Given info as well

## 2016-07-04 NOTE — Assessment & Plan Note (Signed)
Discussed how this problem influences overall health and the risks it imposes  Reviewed plan for weight loss with lower calorie diet (via better food choices and also portion control or program like weight watchers) and exercise building up to or more than 30 minutes 5 days per week including some aerobic activity   She is making the effort S/p bariatric surgery in the past  Having a hard time getting away from her current weight set point

## 2016-07-04 NOTE — Progress Notes (Signed)
Subjective:    Patient ID: Jennifer Bartlett, female    DOB: 25-Nov-1945, 70 y.o.   MRN: [id number]  HPI Here for health maintenance exam and to review chronic medical problems    Feeling well overall  Enjoying grand kids    Had AMW with Katha Cabal 06/30/16  Wt Readings from Last 3 Encounters:  07/04/16 196 lb 8 oz (89.1 kg)  06/30/16 194 lb 8 oz (88.2 kg)  05/03/16 198 lb 12.8 oz (90.2 kg)  taking care of herself -eating very healthy/ working hard on that  Exercise -walking approx 20-30 minutes every other day in her driveway (gets her outside also) bmi is 34.2  Colon cancer screening -can not do colonoscopy due to surgery  Regular /nl bowel habits   Tetanus shot 4/06 She called her ins and was told to get it done  Needs Tdap-new baby exposure    Mammogram was 7/17 - was ok / with oncology  Hx of est rec pos breast cancer  On tamoxifen Self exam-no changes or lumps  Has a breast exam planned before xmas  Saw oncology in oct and also surgeon visit  imms up to date otherwise   dexa 6/17-normal  One fall on a slippery surface without knowledge of it- no injuries (kids spilled bubble soap on the floor) No fractures  Takes her ca and D (takes ca plus D)- 1200 iu  Vit D level low at 23.7-will add 4000 more iu   Hep C screen neg   No nsaids Tumeric helps her aches and pains some   Hx of HTN bp is improved today from last 3 visits  No cp or palpitations or headaches or edema  No side effects to medicines  BP Readings from Last 3 Encounters:  07/04/16 134/86  06/30/16 (!) 152/80  05/03/16 (!) 162/59     Hx of vit B12 def Lab Results  Component Value Date   VITAMINB12 1,035 (H) 06/30/2016  gets B12 shot every 2 mo   Hx of hyperlipidemia Lab Results  Component Value Date   CHOL 208 (H) 06/30/2016   CHOL 202 (H) 08/17/2014   CHOL 212 (H) 08/25/2013   Lab Results  Component Value Date   HDL 54.70 06/30/2016   HDL 56.90 08/17/2014   HDL 58.10 08/25/2013   Lab  Results  Component Value Date   LDLCALC 134 (H) 06/30/2016   LDLCALC 127 (H) 08/17/2014   LDLCALC 107 (H) 08/29/2012   Lab Results  Component Value Date   TRIG 99.0 06/30/2016   TRIG 92.0 08/17/2014   TRIG 87.0 08/25/2013   Lab Results  Component Value Date   CHOLHDL 4 06/30/2016   CHOLHDL 4 08/17/2014   CHOLHDL 4 08/25/2013   Lab Results  Component Value Date   LDLDIRECT 139.2 08/25/2013   LDLDIRECT 151.5 03/31/2011   does make an effort to eat as low fat as possible   Clinical Support on 06/30/2016  Component Date Value Ref Range Status  . Sodium 06/30/2016 139  135 - 145 mEq/L Final  . Potassium 06/30/2016 4.0  3.5 - 5.1 mEq/L Final  . Chloride 06/30/2016 100  96 - 112 mEq/L Final  . CO2 06/30/2016 31  19 - 32 mEq/L Final  . Glucose, Bld 06/30/2016 107* 70 - 99 mg/dL Final  . BUN 06/30/2016 10  6 - 23 mg/dL Final  . Creatinine, Ser 06/30/2016 0.71  0.40 - 1.20 mg/dL Final  . Total Bilirubin 06/30/2016 0.3  0.2 - 1.2  mg/dL Final  . Alkaline Phosphatase 06/30/2016 70  39 - 117 U/L Final  . AST 06/30/2016 15  0 - 37 U/L Final  . ALT 06/30/2016 12  0 - 35 U/L Final  . Total Protein 06/30/2016 6.8  6.0 - 8.3 g/dL Final  . Albumin 06/30/2016 3.8  3.5 - 5.2 g/dL Final  . Calcium 06/30/2016 9.0  8.4 - 10.5 mg/dL Final  . GFR 06/30/2016 86.70  >60.00 mL/min Final  . WBC 06/30/2016 7.0  4.0 - 10.5 K/uL Final  . RBC 06/30/2016 3.97  3.87 - 5.11 Mil/uL Final  . Hemoglobin 06/30/2016 11.6* 12.0 - 15.0 g/dL Final  . HCT 06/30/2016 34.8* 36.0 - 46.0 % Final  . MCV 06/30/2016 87.7  78.0 - 100.0 fl Final  . MCHC 06/30/2016 33.3  30.0 - 36.0 g/dL Final  . RDW 06/30/2016 14.7  11.5 - 15.5 % Final  . Platelets 06/30/2016 293.0  150.0 - 400.0 K/uL Final  . Neutrophils Relative % 06/30/2016 67.3  43.0 - 77.0 % Final  . Lymphocytes Relative 06/30/2016 21.7  12.0 - 46.0 % Final  . Monocytes Relative 06/30/2016 6.5  3.0 - 12.0 % Final  . Eosinophils Relative 06/30/2016 3.8  0.0 - 5.0 %  Final  . Basophils Relative 06/30/2016 0.7  0.0 - 3.0 % Final  . Neutro Abs 06/30/2016 4.7  1.4 - 7.7 K/uL Final  . Lymphs Abs 06/30/2016 1.5  0.7 - 4.0 K/uL Final  . Monocytes Absolute 06/30/2016 0.5  0.1 - 1.0 K/uL Final  . Eosinophils Absolute 06/30/2016 0.3  0.0 - 0.7 K/uL Final  . Basophils Absolute 06/30/2016 0.1  0.0 - 0.1 K/uL Final  . Cholesterol 06/30/2016 208* 0 - 200 mg/dL Final  . Triglycerides 06/30/2016 99.0  0.0 - 149.0 mg/dL Final  . HDL 06/30/2016 54.70  >39.00 mg/dL Final  . VLDL 06/30/2016 19.8  0.0 - 40.0 mg/dL Final  . LDL Cholesterol 06/30/2016 134* 0 - 99 mg/dL Final  . Total CHOL/HDL Ratio 06/30/2016 4   Final  . NonHDL 06/30/2016 153.41   Final  . TSH 06/30/2016 2.76  0.35 - 4.50 uIU/mL Final  . Vitamin B-12 06/30/2016 1035* 211 - 911 pg/mL Final  . VITD 06/30/2016 23.71* 30.00 - 100.00 ng/mL Final  . HCV Ab 07/01/2016 NEGATIVE  NEGATIVE Final     Glucose 107 Does not eat sweets   Hb stable at 11.6  Patient Active Problem List   Diagnosis Date Noted  . Hyperglycemia 07/04/2016  . Routine general medical examination at a health care facility 07/04/2016  . Encounter for hepatitis C screening test for low risk patient 06/29/2016  . Estrogen deficiency 01/11/2016  . Breast cancer of upper-inner quadrant of right female breast ([place]) 02/26/2015  . Colon cancer screening 09/06/2012  . Vitamin D deficiency 12/28/2009  . STRESS REACTION, ACUTE, WITH EMOTIONAL DISTURBANCE 12/07/2009  . HYPOGLYCEMIA NOS 05/28/2007  . Vitamin B12 deficiency 03/07/2007  . HYPERCHOLESTEROLEMIA 03/07/2007  . Obesity 03/07/2007  . Essential hypertension 03/07/2007  . GERD 03/07/2007   Past Medical History:  Diagnosis Date  . Anemia   . Anxiety   . Breast cancer of upper-inner quadrant of right female breast ([place]) 02/26/2015  . GERD (gastroesophageal reflux disease)   . Hot flashes   . Hyperlipidemia   . Hypertension   . Hypoglycemia   . Obesity   . Vitamin B 12 deficiency     Past Surgical History:  Procedure Laterality Date  . ABDOMINAL HYSTERECTOMY    .  GASTRIC BYPASS  08/2003  . HERNIA REPAIR    . KNEE SURGERY     left  . PARTIAL HYSTERECTOMY  1990's   bleeding  . RADIOACTIVE SEED GUIDED MASTECTOMY WITH AXILLARY SENTINEL LYMPH NODE BIOPSY Right 03/09/2015   Procedure: RADIOACTIVE SEED GUIDED RIGHT PARTIAL MASTECTOMY WITH AXILLARY SENTINEL LYMPH NODE BIOPSY;  Surgeon: Stark Klein, MD;  Location: [facility];  Service: General;  Laterality: Right;  . TUBAL LIGATION     Social History  Substance Use Topics  . Smoking status: Never Smoker  . Smokeless tobacco: Never Used  . Alcohol use No   Family History  Problem Relation Age of Onset  . Heart disease Mother     MI  . Hypertension Mother   . Alcohol abuse Father   . Heart disease Father     smoker  . Hypertension Father   . Breast cancer Paternal Grandmother   . Diabetes Maternal Aunt    Allergies  Allergen Reactions  . Crestor [Rosuvastatin Calcium] Other (See Comments)    Myalgia   . Lipitor [Atorvastatin Calcium] Other (See Comments)    Aches and pains  . Penicillins Shortness Of Breath and Swelling  . Zocor [Simvastatin - High Dose] Other (See Comments)    Muscle/joint aches   Current Outpatient Prescriptions on File Prior to Visit  Medication Sig Dispense Refill  . BIOTIN PO Take 1 tablet by mouth daily.    Marland Kitchen CALCIUM PO Take by mouth daily.      . cholecalciferol (VITAMIN D) 400 UNITS TABS Take 400 Units by mouth daily.     . cloNIDine (CATAPRES - DOSED IN MG/24 HR) 0.1 mg/24hr patch Place 1 patch (0.1 mg total) onto the skin once a week. 4 patch 12  . cyanocobalamin (,VITAMIN B-12,) 1000 MCG/ML injection Inject 1,000 mcg into the muscle once. Every 2 months     . gabapentin (NEURONTIN) 300 MG capsule Take 1 capsule (300 mg total) by mouth at bedtime. 90 capsule 4  . hydrochlorothiazide (HYDRODIURIL) 25 MG tablet Take 1 tablet (25 mg total) by mouth daily. 90 tablet 3   . loratadine (CLARITIN) 10 MG tablet Take 10 mg by mouth daily.    . Multiple Vitamin (MULTIVITAMIN) capsule Take 1 capsule by mouth daily.      Marland Kitchen omeprazole (PRILOSEC) 20 MG capsule TAKE 1 CAPSULE (20 MG TOTAL) BY MOUTH DAILY. 90 capsule 3  . tamoxifen (NOLVADEX) 20 MG tablet Take 1 tablet (20 mg total) by mouth daily. 90 tablet 4  . TURMERIC PO Take 1 capsule by mouth 3 (three) times daily.    Marland Kitchen venlafaxine XR (EFFEXOR-XR) 150 MG 24 hr capsule Take 1 capsule (150 mg total) by mouth daily with breakfast. (Patient taking differently: Take 300 mg by mouth daily with breakfast. ) 90 capsule 4   No current facility-administered medications on file prior to visit.     Review of Systems Review of Systems  Constitutional: Negative for fever, appetite change, fatigue and unexpected weight change.  Eyes: Negative for pain and visual disturbance.  Respiratory: Negative for cough and shortness of breath.   Cardiovascular: Negative for cp or palpitations    Gastrointestinal: Negative for nausea, diarrhea and constipation.  Genitourinary: Negative for urgency and frequency.  Skin: Negative for pallor or rash   Neurological: Negative for weakness, light-headedness, numbness and headaches.  Hematological: Negative for adenopathy. Does not bruise/bleed easily.  Psychiatric/Behavioral: Negative for dysphoric mood. The patient is not nervous/anxious.  Objective:   Physical Exam  Constitutional: She appears well-developed and well-nourished. No distress.  obese and well appearing   HENT:  Head: Normocephalic and atraumatic.  Right Ear: External ear normal.  Left Ear: External ear normal.  Mouth/Throat: Oropharynx is clear and moist.  Eyes: Conjunctivae and EOM are normal. Pupils are equal, round, and reactive to light. No scleral icterus.  Neck: Normal range of motion. Neck supple. No JVD present. Carotid bruit is not present. No thyromegaly present.  Cardiovascular: Normal rate, regular  rhythm, normal heart sounds and intact distal pulses.  Exam reveals no gallop.   Pulmonary/Chest: Effort normal and breath sounds normal. No respiratory distress. She has no wheezes. She exhibits no tenderness.  Abdominal: Soft. Bowel sounds are normal. She exhibits no distension, no abdominal bruit and no mass. There is no tenderness.  Genitourinary: No breast swelling, tenderness, discharge or bleeding.  Genitourinary Comments: Breast exam: No mass, nodules, thickening, tenderness, bulging, retraction, inflamation, nipple discharge or skin changes noted.  No axillary or clavicular LA.    Baseline scarring on R breast    Musculoskeletal: Normal range of motion. She exhibits no edema or tenderness.  No kyphosis   Lymphadenopathy:    She has no cervical adenopathy.  Neurological: She is alert. She has normal reflexes. No cranial nerve deficit. She exhibits normal muscle tone. Coordination normal.  Skin: Skin is warm and dry. No rash noted. No erythema. No pallor.  Some skin tags   Psychiatric: She has a normal mood and affect.          Assessment & Plan:   Problem List Items Addressed This Visit      Cardiovascular and Mediastinum   Essential hypertension - Primary    bp in fair control at this time  BP Readings from Last 1 Encounters:  07/04/16 134/86   No changes needed Disc lifstyle change with low sodium diet and exercise  Improved with stopping nsaids and exercising          Other   Vitamin D deficiency    Level is still low  Continue 1200 iu with ca and add another 4000 iu of vit D3 separately otc daily  Also get outdoors Disc imp to bone and overall health       Vitamin B12 deficiency    In pt s/p bariatric surgery  Level is stable with a B12 shot every 2 mo       Routine general medical examination at a health care facility    Reviewed health habits including diet and exercise and skin cancer prevention Reviewed appropriate screening tests for age  Also  reviewed health mt list, fam hx and immunization status , as well as social and family history   See HPI AMW reviewed Labs reviewed Enc wt loss and low fat/carb diet  Tdap today (around a new baby)  Doing well with breast cancer f/u  Inc vit D by 4000 iu daily for bone and overall health       Obesity    Discussed how this problem influences overall health and the risks it imposes  Reviewed plan for weight loss with lower calorie diet (via better food choices and also portion control or program like weight watchers) and exercise building up to or more than 30 minutes 5 days per week including some aerobic activity   She is making the effort S/p bariatric surgery in the past  Having a hard time getting away from her current weight set point  Hyperglycemia    Long disc re: low glycemic diet/exercise and wt loss to prevent DM2 Continue to follow A1C next time       HYPERCHOLESTEROLEMIA    Disc goals for lipids and reasons to control them Rev labs with pt Rev low sat fat diet in detail  Labs rev  LDL is slowly on the rise Rev diet in detail - red sat and trans fats and also handout given       Encounter for hepatitis C screening test for low risk patient    Neg screening        Colon cancer screening    Not candidate for colonoscopy in light of prev GI surgery  Ordered cologuard kit for her  Given info as well       Breast cancer of upper-inner quadrant of right female breast ([place])    Pt is doing well  On tamoxifen currently

## 2016-07-04 NOTE — Assessment & Plan Note (Signed)
Pt is doing well  On tamoxifen currently

## 2016-07-04 NOTE — Assessment & Plan Note (Signed)
Level is still low  Continue 1200 iu with ca and add another 4000 iu of vit D3 separately otc daily  Also get outdoors Disc imp to bone and overall health

## 2016-07-04 NOTE — Assessment & Plan Note (Signed)
Long disc re: low glycemic diet/exercise and wt loss to prevent DM2 Continue to follow A1C next time

## 2016-07-04 NOTE — Assessment & Plan Note (Signed)
bp in fair control at this time  BP Readings from Last 1 Encounters:  07/04/16 134/86   No changes needed Disc lifstyle change with low sodium diet and exercise  Improved with stopping nsaids and exercising

## 2016-07-04 NOTE — Assessment & Plan Note (Signed)
In pt s/p bariatric surgery  Level is stable with a B12 shot every 2 mo

## 2016-07-27 DIAGNOSIS — Z1211 Encounter for screening for malignant neoplasm of colon: Secondary | ICD-10-CM | POA: Diagnosis not present

## 2016-07-27 DIAGNOSIS — Z1212 Encounter for screening for malignant neoplasm of rectum: Secondary | ICD-10-CM | POA: Diagnosis not present

## 2016-07-29 LAB — COLOGUARD: COLOGUARD: NEGATIVE

## 2016-08-09 ENCOUNTER — Encounter: Payer: Self-pay | Admitting: *Deleted

## 2016-12-13 ENCOUNTER — Encounter: Payer: Self-pay | Admitting: Gynecology

## 2017-01-15 ENCOUNTER — Other Ambulatory Visit: Payer: Self-pay | Admitting: Family Medicine

## 2017-01-28 ENCOUNTER — Other Ambulatory Visit: Payer: Self-pay | Admitting: Oncology

## 2017-03-06 ENCOUNTER — Telehealth: Payer: Self-pay

## 2017-03-06 NOTE — Telephone Encounter (Signed)
Called and left a message with a new appt due to bmdc  Jennifer Bartlett 

## 2017-04-11 ENCOUNTER — Other Ambulatory Visit: Payer: Medicare Other

## 2017-04-11 ENCOUNTER — Ambulatory Visit: Payer: Medicare Other | Admitting: Oncology

## 2017-04-18 ENCOUNTER — Other Ambulatory Visit: Payer: Self-pay | Admitting: Family Medicine

## 2017-05-18 ENCOUNTER — Other Ambulatory Visit: Payer: Self-pay

## 2017-05-18 DIAGNOSIS — Z170 Estrogen receptor positive status [ER+]: Secondary | ICD-10-CM

## 2017-05-18 DIAGNOSIS — C50211 Malignant neoplasm of upper-inner quadrant of right female breast: Secondary | ICD-10-CM

## 2017-05-21 ENCOUNTER — Ambulatory Visit: Payer: Medicare Other | Admitting: Oncology

## 2017-05-21 ENCOUNTER — Telehealth: Payer: Self-pay | Admitting: Oncology

## 2017-05-21 ENCOUNTER — Other Ambulatory Visit: Payer: Medicare Other

## 2017-05-21 NOTE — Telephone Encounter (Signed)
Spoke with patient and rescheduled her appointment due to her being sick.

## 2017-05-31 ENCOUNTER — Ambulatory Visit: Payer: Medicare Other

## 2017-05-31 NOTE — Progress Notes (Signed)
[facility]  Telephone:(336) [phone] Fax:(336) [phone]     ID: Jennifer Bartlett DOB: [birth year]  MR#: [id number]  ZHY#:[id number]  Patient Care Team: Jennifer Pimple, MD as PCP - General Jennifer Bartlett Jennifer Belling, NP as Nurse Practitioner (Obstetrics and Gynecology) Jennifer Lint, MD as Consulting Physician (General Surgery) Jennifer Bartlett, Jennifer Hue, MD as Consulting Physician (Oncology) Jennifer Proud, RN as Registered Nurse Jennifer Angelica, RN as Registered Nurse Jennifer Bartlett, Jennifer Salvo, NP as Nurse Practitioner (Nurse Practitioner) Jennifer Huh, MD as Consulting Physician (Orthopedic Surgery) PCP: Jennifer Pimple, MD  OTHER MD:  CHIEF COMPLAINT: Estrogen receptor positive breast cancer  CURRENT TREATMENT:  TAMOXIFEN   BREAST CANCER HISTORY:  From the original intake note:  Jennifer Bartlett underwent bilateral screening mammography [facility] July [facility]. This showed a possible mass in the right breast. She was recalled for diagnostic right mammography with tomosynthesis and right breast ultrasonography 02/17/2015. This found the breast density to be category C. In the right breast upper inner quadrant there was a persistent poorly marginated nodule which was not palpable. By ultrasound this measured 0.5 cm. There was no evidence of right axillary lymphadenopathy.  Biopsy of this mass 02/24/2015 showed ([id number]) an invasive lobular breast cancer, E-cadherin negative, grade 1, estrogen receptor 95% positive, progesterone receptor 60% positive, both with strong staining intensity, with an MIB-1 of 3%, and no HER-2 amplification, the signals ratio being 1.29 and the number per cell 2.00.  Her subsequent history is as detailed below  INTERVAL HISTORY: Jennifer Bartlett returns today for follow-up and treatment of her estrogen receptor positive breast cancer.  She continues on tamoxifen, which she tolerates well. She does experience severe hot flashes. The TTS-1 patches and  venlafaxine help.  REVIEW OF SYSTEMS: Jennifer Bartlett reports having a 4th blood grandchild. She has been trying exercise more. Pt takes 2 Aleve once a day for her knee pain. Pt notes a bump to her right arm that started this past week. She said it was very warm and using ice helped to decrease it in size. She denies unusual headaches, visual changes, nausea, vomiting, or dizziness. There has been no unusual cough, phlegm production, or pleurisy. This been no change in bowel or bladder habits. She denies unexplained fatigue or unexplained weight loss, bleeding, rash, or fever. A detailed review of systems was otherwise entirely stable.   PAST MEDICAL HISTORY: Past Medical History:  Diagnosis Date  . Anemia   . Anxiety   . 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  . GERD (gastroesophageal reflux disease)   . Hot flashes   . Hyperlipidemia   . Hypertension   . Hypoglycemia   . Obesity   . Vitamin B 12 deficiency     PAST SURGICAL HISTORY: Past Surgical History:  Procedure Laterality Date  . ABDOMINAL HYSTERECTOMY    . GASTRIC BYPASS  08/2003  . HERNIA REPAIR    . KNEE SURGERY     left  . PARTIAL HYSTERECTOMY  1990's   bleeding  . RADIOACTIVE SEED GUIDED PARTIAL MASTECTOMY WITH AXILLARY SENTINEL LYMPH NODE BIOPSY Right 03/09/2015   Procedure: RADIOACTIVE SEED GUIDED RIGHT PARTIAL MASTECTOMY WITH AXILLARY SENTINEL LYMPH NODE BIOPSY;  Surgeon: Jennifer Lint, MD;  Location: [facility];  Service: General;  Laterality: Right;  . TUBAL LIGATION      FAMILY HISTORY Family History  Problem Relation Age of Onset  . Heart disease Mother  MI  . Hypertension Mother   . Alcohol abuse Father   . Heart disease Father        smoker  . Hypertension Father   . Breast cancer Paternal Grandmother   . Diabetes Maternal Aunt    the patient's father died at the age of 1 and her mother at the age of 43, both from heart attacks in the setting of tobacco  abuse. The patient has 3 brothers, one sister. The patient's paternal grandmother may have been diagnosed with breast cancer in her late 1s, but that is not certain. There is no other history of breast or ovarian cancer in the family to the patient's knowledge.  GYNECOLOGIC HISTORY:  Patient's last menstrual period was 06/08/1989. Menarche age 29, first live birth age 22; the patient is GX P2. She underwent hysterectomy without salpingo-oophorectomy more than 20 years ago. She has been on hormone replacement more than 20 years. She understands this does increase the risk of developing breast cancer. This was stopped July 2016.  SOCIAL HISTORY: (Updated 06/01/17) Jennifer Bartlett used to work for [employer] but is now retired. Her husband Jennifer Bartlett (goes by Jennifer Bartlett) worked 49 years for [employer]. The patient's daughter Jennifer Bartlett lives in [place] and is Interior and spatial designer of the afterschool program in [place]. The patient's son Jennifer Bartlett lives in [place] and works for [employer], a company that cleans up after disaster or certain crimes. The patient also has an adopted daughter with knee Jennifer Bartlett who lives in [place] and is a homemaker. The patient has 4 blood grandchildren and 2 adopted grandchildren. At home its the patient and her husband, her daughter and her husband and 2 grandchildren. Jennifer Bartlett attends a [employer]    ADVANCED DIRECTIVES: Not in place   HEALTH MAINTENANCE: Social History  Substance Use Topics  . Smoking status: Never Smoker  . Smokeless tobacco: Never Used  . Alcohol use No     Colonoscopy: Never (status post bypass surgery)  PAP: 2015  Bone density: c. 2012; normal  Lipid panel:  Allergies  Allergen Reactions  . Crestor [Rosuvastatin Calcium] Other (See Comments)    Myalgia   . Lipitor [Atorvastatin Calcium] Other (See Comments)    Aches and pains  . Penicillins Shortness Of Breath and Swelling  . Zocor [Simvastatin - High Dose] Other  (See Comments)    Muscle/joint aches    Current Outpatient Prescriptions  Medication Sig Dispense Refill  . BIOTIN PO Take 1 tablet by mouth daily.    Marland Kitchen CALCIUM PO Take by mouth daily.      . cholecalciferol (VITAMIN D) 400 UNITS TABS Take 400 Units by mouth daily.     . cloNIDine (CATAPRES - DOSED IN MG/24 HR) 0.1 mg/24hr patch Place 1 patch (0.1 mg total) onto the skin once a week. 4 patch 12  . cyanocobalamin (,VITAMIN B-12,) 1000 MCG/ML injection Inject 1,000 mcg into the muscle once. Every 2 months     . gabapentin (NEURONTIN) 300 MG capsule TAKE ONE CAPSULE BY MOUTH AT BEDTIME 90 capsule 4  . hydrochlorothiazide (HYDRODIURIL) 25 MG tablet TAKE 1 TABLET BY MOUTH ONCE DAILY 90 tablet 0  . loratadine (CLARITIN) 10 MG tablet Take 10 mg by mouth daily.    . Multiple Vitamin (MULTIVITAMIN) capsule Take 1 capsule by mouth daily.      Marland Kitchen omeprazole (PRILOSEC) 20 MG capsule TAKE ONE CAPSULE BY MOUTH ONCE DAILY 90 capsule 0  . tamoxifen (NOLVADEX) 20 MG tablet Take 1 tablet (  20 mg total) by mouth daily. 90 tablet 4  . TURMERIC PO Take 1 capsule by mouth 3 (three) times daily.    Marland Kitchen venlafaxine XR (EFFEXOR-XR) 150 MG 24 hr capsule Take 1 capsule (150 mg total) by mouth daily with breakfast. (Patient taking differently: Take 300 mg by mouth daily with breakfast. ) 90 capsule 4   No current facility-administered medications for this visit.     OBJECTIVE: Middle-aged white woman who appears well Vitals:   06/01/17 1327  BP: (!) 165/71  Pulse: 75  Resp: 18  Temp: 97.7 F (36.5 C)  SpO2: 100%     Body mass index is 34.26 kg/m.    ECOG FS:0 - Asymptomatic  Sclerae unicteric, pupils round and equal Oropharynx clear and moist No cervical or supraclavicular adenopathy Lungs no rales or rhonchi Heart regular rate and rhythm Abd soft, nontender, positive bowel sounds MSK no focal spinal tenderness, no upper extremity lymphedema Neuro: nonfocal, well oriented, appropriate affect Breasts: The  right breast is status post lumpectomy and radiation, with no evidence of local recurrence.  The left breast is benign.  Both axillae are benign. Skin: In the dorsum of the right hand there is an area of excoriation.  In the right upper arm just above the elbow ventrally there is a slightly erythematous area which is minimally indurated.  This is not hot   LAB RESULTS:  CMP     Component Value Date/Time   NA 141 06/01/2017 1145   K 4.0 06/01/2017 1145   CL 100 06/30/2016 0908   CO2 30 (H) 06/01/2017 1145   GLUCOSE 65 (L) 06/01/2017 1145   BUN 15.8 06/01/2017 1145   CREATININE 0.8 06/01/2017 1145   CALCIUM 9.1 06/01/2017 1145   PROT 7.3 06/01/2017 1145   ALBUMIN 3.6 06/01/2017 1145   AST 19 06/01/2017 1145   ALT 15 06/01/2017 1145   ALKPHOS 59 06/01/2017 1145   BILITOT 0.28 06/01/2017 1145   GFRNONAA 99.73 12/07/2009 0911   GFRAA 94 05/22/2008 0847    INo results found for: SPEP, UPEP  Lab Results  Component Value Date   WBC 5.9 06/01/2017   NEUTROABS 3.6 06/01/2017   HGB 11.9 06/01/2017   HCT 36.5 06/01/2017   MCV 88.8 06/01/2017   PLT 251 06/01/2017      Chemistry      Component Value Date/Time   NA 141 06/01/2017 1145   K 4.0 06/01/2017 1145   CL 100 06/30/2016 0908   CO2 30 (H) 06/01/2017 1145   BUN 15.8 06/01/2017 1145   CREATININE 0.8 06/01/2017 1145      Component Value Date/Time   CALCIUM 9.1 06/01/2017 1145   ALKPHOS 59 06/01/2017 1145   AST 19 06/01/2017 1145   ALT 15 06/01/2017 1145   BILITOT 0.28 06/01/2017 1145       No results found for: LABCA2  No components found for: LABCA125  No results for input(s): INR in the last 168 hours.  Urinalysis No results found for: COLORURINE, APPEARANCEUR, LABSPEC, PHURINE, GLUCOSEU, HGBUR, BILIRUBINUR, KETONESUR, PROTEINUR, UROBILINOGEN, NITRITE, LEUKOCYTESUR  STUDIES: She tells me she had mammography in July at the breast center but I am not showing that report in our records  ASSESSMENT: 71 y.o.  [place], [place] woman status post right breast Upper inner quadrant biopsy 02/24/2015 for a clinical T1a N0, stage IA invasive lobular carcinoma, grade 1, estrogen and progesterone receptor strongly positive, HER-2 not amplified, with an MIB-1 of 3%  (1)  Right lumpectomy  and sentinel lymph node sampling 03/09/2015 showed a pT1b pN0, stage IA  Invasive lobular carcinoma, grade 2 , repeat HER-2 again negative  (2) adjuvant radiation 04/07/2015 through 05/13/2015: Right breast 4860 cGy in 27 sessions, no boost    (3)  started tamoxifen in January 2017  (a)  Status post hysterectomy without salpingo-oophorectomy remotely  PLAN:  Ellamay is now a little over 1 year out from definitive surgery for her breast cancer with no evidence of disease recurrence.  This is favorable.  She is tolerating tamoxifen generally well except for the hot flashes.  The plan will be to continue tamoxifen for a total of 5 years.  I refilled her hot flashes and gabapentin as well as her venlafaxine.  Generally I do not use venlafaxine above 150 mg daily but she has found the 300 mg dose at bedtime useful in terms of hot flashes, depression, and "ability to get things done".  She reports no side effects from this.  She is aware that she cannot simply stop this medication or she will experience significant side effects.  She has some irritation, perhaps very mild phlebitis in the right upper extremity right above the elbow ventrally.  I do not think this warrants antibiotics at present but it might if it gets any worse.  I wrote her a prescription for ciprofloxacin to take twice a day for 5 days if the redness seems to worsen.  Otherwise she will see Dr. Donell Beers in 6 months and return to see me in call for any problems that may develop before that visit.  Caedyn Raygoza, Jennifer Hue, MD  06/01/17 1:41 PM Medical Oncology and Hematology [facility] [address] [place], [place] [address] Tel.  [phone]    Fax. [phone]  This document serves as a record of services personally performed by Lowella Dell, MD. It was created on his behalf by Diona Browner, a trained medical scribe. The creation of this record is based on the scribe's personal observations and the provider's statements to them. This document has been checked and approved by the attending provider.

## 2017-06-01 ENCOUNTER — Other Ambulatory Visit (HOSPITAL_BASED_OUTPATIENT_CLINIC_OR_DEPARTMENT_OTHER): Payer: Medicare Other

## 2017-06-01 ENCOUNTER — Telehealth: Payer: Self-pay | Admitting: Oncology

## 2017-06-01 ENCOUNTER — Ambulatory Visit (HOSPITAL_BASED_OUTPATIENT_CLINIC_OR_DEPARTMENT_OTHER): Payer: Medicare Other | Admitting: Oncology

## 2017-06-01 VITALS — BP 165/71 | HR 75 | Temp 97.7°F | Resp 18 | Ht 63.5 in | Wt 196.5 lb

## 2017-06-01 DIAGNOSIS — Z79810 Long term (current) use of selective estrogen receptor modulators (SERMs): Secondary | ICD-10-CM

## 2017-06-01 DIAGNOSIS — Z170 Estrogen receptor positive status [ER+]: Secondary | ICD-10-CM

## 2017-06-01 DIAGNOSIS — C50211 Malignant neoplasm of upper-inner quadrant of right female breast: Secondary | ICD-10-CM | POA: Diagnosis not present

## 2017-06-01 LAB — CBC WITH DIFFERENTIAL/PLATELET
BASO%: 1.2 % (ref 0.0–2.0)
BASOS ABS: 0.1 10*3/uL (ref 0.0–0.1)
EOS%: 4.1 % (ref 0.0–7.0)
Eosinophils Absolute: 0.2 10*3/uL (ref 0.0–0.5)
HEMATOCRIT: 36.5 % (ref 34.8–46.6)
HEMOGLOBIN: 11.9 g/dL (ref 11.6–15.9)
LYMPH#: 1.5 10*3/uL (ref 0.9–3.3)
LYMPH%: 24.7 % (ref 14.0–49.7)
MCH: 29 pg (ref 25.1–34.0)
MCHC: 32.7 g/dL (ref 31.5–36.0)
MCV: 88.8 fL (ref 79.5–101.0)
MONO#: 0.5 10*3/uL (ref 0.1–0.9)
MONO%: 8.4 % (ref 0.0–14.0)
NEUT#: 3.6 10*3/uL (ref 1.5–6.5)
NEUT%: 61.6 % (ref 38.4–76.8)
PLATELETS: 251 10*3/uL (ref 145–400)
RBC: 4.12 10*6/uL (ref 3.70–5.45)
RDW: 14.9 % — AB (ref 11.2–14.5)
WBC: 5.9 10*3/uL (ref 3.9–10.3)

## 2017-06-01 LAB — COMPREHENSIVE METABOLIC PANEL
ALT: 15 U/L (ref 0–55)
AST: 19 U/L (ref 5–34)
Albumin: 3.6 g/dL (ref 3.5–5.0)
Alkaline Phosphatase: 59 U/L (ref 40–150)
Anion Gap: 7 mEq/L (ref 3–11)
BILIRUBIN TOTAL: 0.28 mg/dL (ref 0.20–1.20)
BUN: 15.8 mg/dL (ref 7.0–26.0)
CHLORIDE: 104 meq/L (ref 98–109)
CO2: 30 mEq/L — ABNORMAL HIGH (ref 22–29)
CREATININE: 0.8 mg/dL (ref 0.6–1.1)
Calcium: 9.1 mg/dL (ref 8.4–10.4)
EGFR: >60 mL/min/{1.73_m2} (ref >60)
GLUCOSE: 65 mg/dL — AB (ref 70–140)
Potassium: 4 mEq/L (ref 3.5–5.1)
Sodium: 141 mEq/L (ref 136–145)
TOTAL PROTEIN: 7.3 g/dL (ref 6.4–8.3)

## 2017-06-01 MED ORDER — TAMOXIFEN CITRATE 20 MG PO TABS: 20.0000 mg | ORAL_TABLET | Freq: Every day | ORAL | 4 refills | Status: DC

## 2017-06-01 MED ORDER — GABAPENTIN 300 MG PO CAPS: 300.0000 mg | ORAL_CAPSULE | Freq: Every day | ORAL | 4 refills | Status: DC

## 2017-06-01 MED ORDER — VENLAFAXINE HCL ER 150 MG PO CP24: 150.0000 mg | ORAL_CAPSULE | Freq: Every day | ORAL | 4 refills | Status: DC

## 2017-06-01 MED ORDER — CIPROFLOXACIN HCL 500 MG PO TABS: 500.0000 mg | ORAL_TABLET | Freq: Two times a day (BID) | ORAL | 0 refills | Status: DC

## 2017-06-01 NOTE — Telephone Encounter (Signed)
Gave patient avs and calendar with appt per 11/2 los.

## 2017-06-05 ENCOUNTER — Ambulatory Visit: Payer: Medicare Other

## 2017-06-18 ENCOUNTER — Other Ambulatory Visit: Payer: Self-pay | Admitting: Oncology

## 2017-06-18 DIAGNOSIS — Z139 Encounter for screening, unspecified: Secondary | ICD-10-CM

## 2017-06-19 IMAGING — MG MM DIAG BREAST TOMO UNI RIGHT
12 series · 12 of 28 positions shown · non-contrast
Comparison: Previous exam(s).

CLINICAL DATA: Right breast upper inner quadrant focal asymmetry
seen on most recent screening mammogram.

EXAM:
DIGITAL DIAGNOSTIC RIGHT MAMMOGRAM WITH 3D TOMOSYNTHESIS WITH CAD
ULTRASOUND RIGHT BREAST

[R CC (1 of 3)]
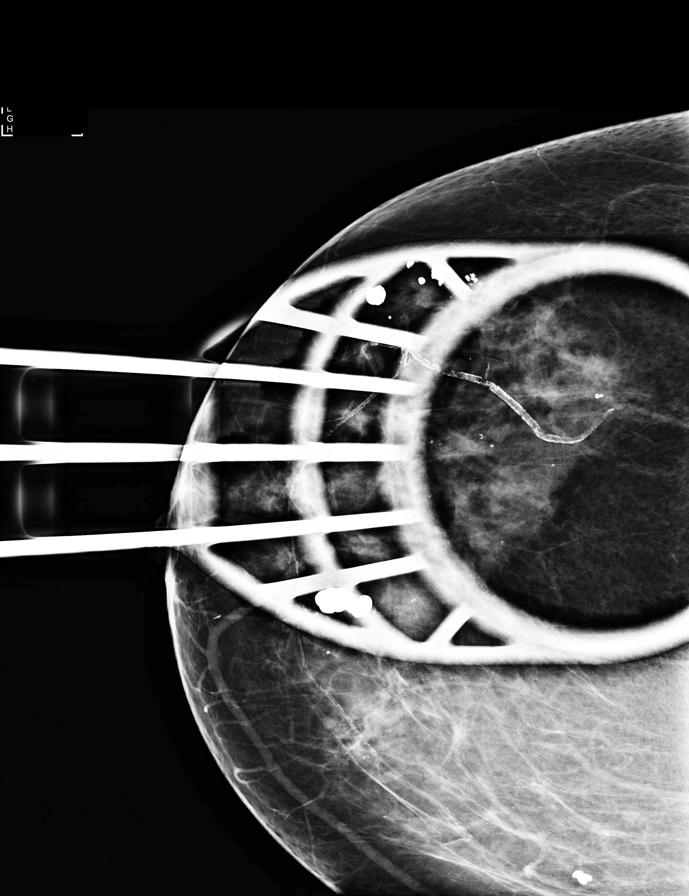

[R CC (2 of 3)]
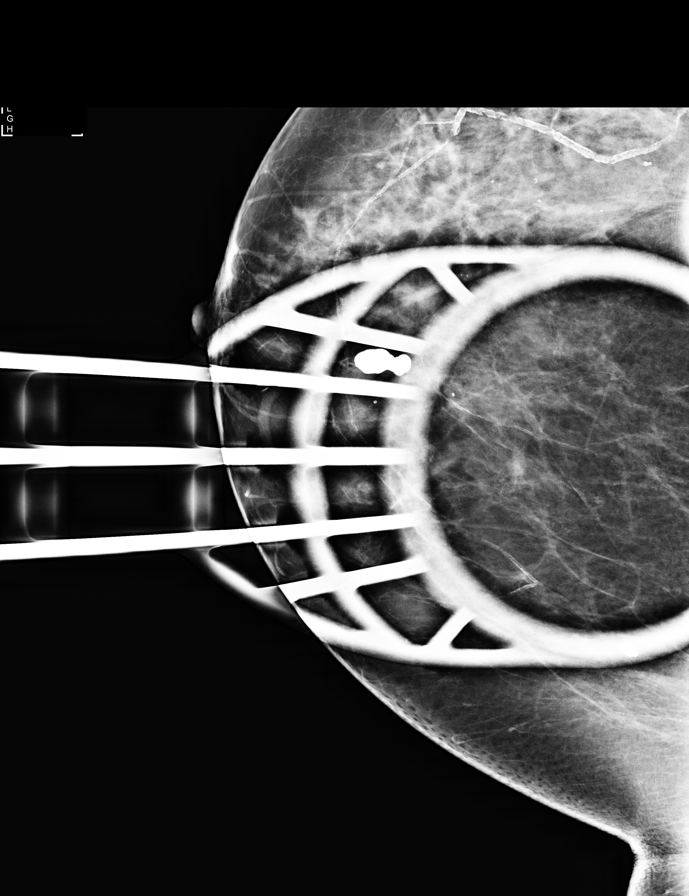

[R CC (3 of 3)]
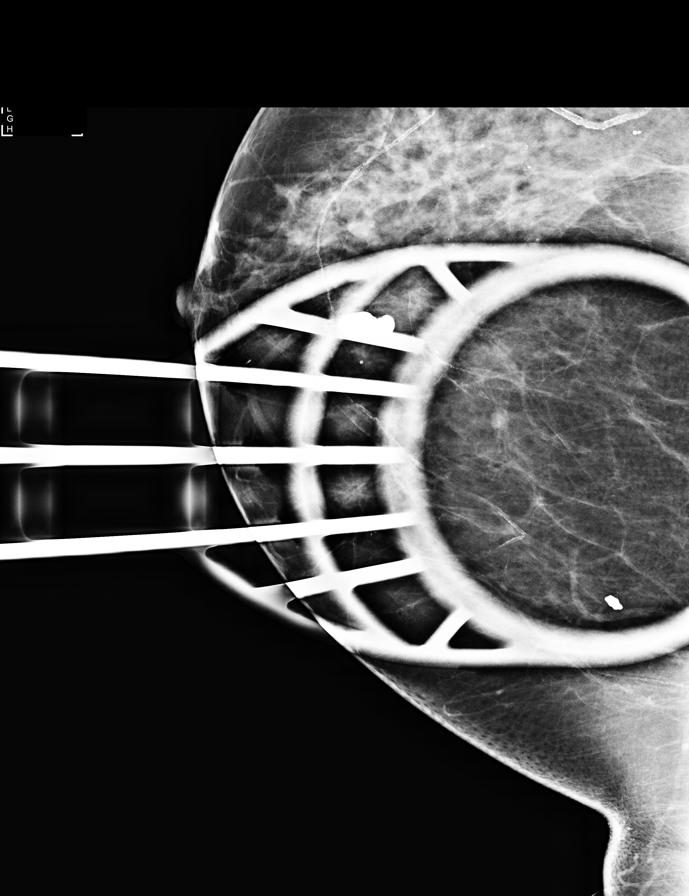

[R MLO]
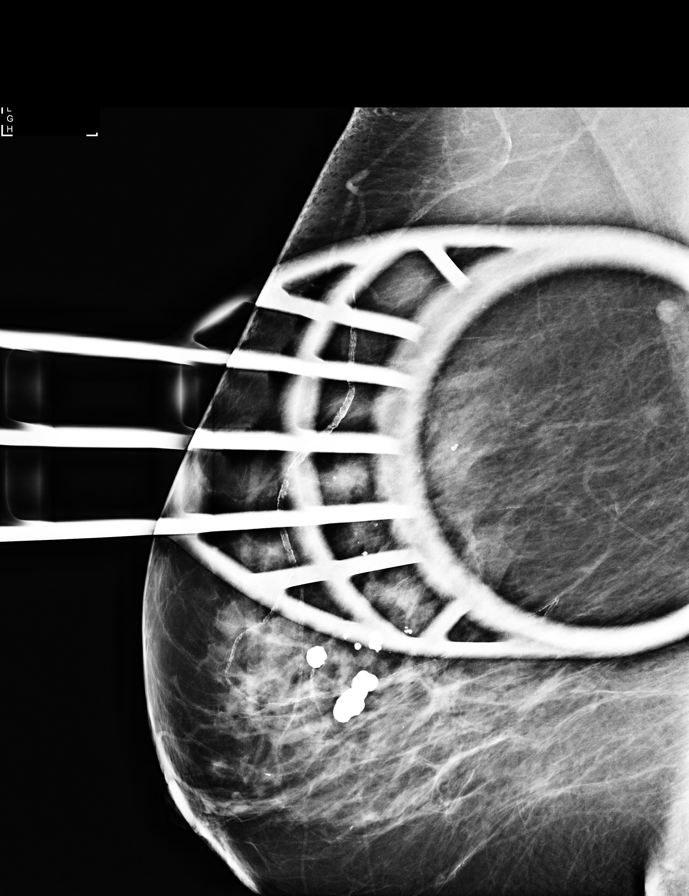

[R CC tomo (1 of 6)]
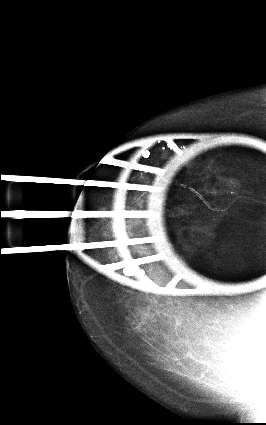

[R CC tomo (2 of 6)]
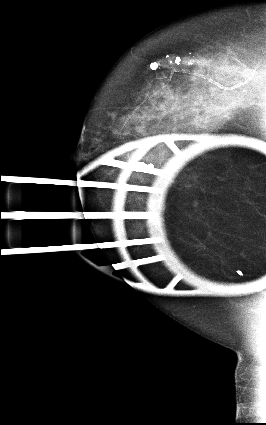

[R CC tomo (3 of 6)]
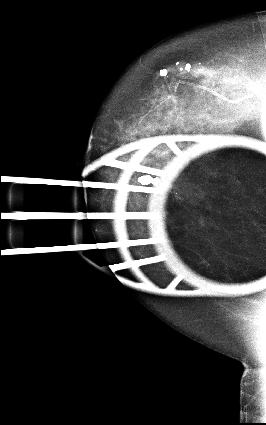

[R MLO tomo (1 of 2)]
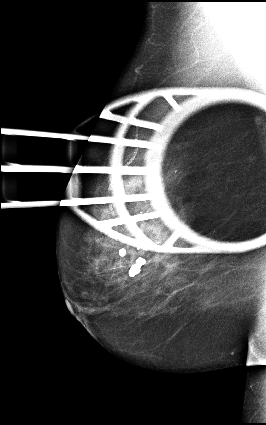

[R CC tomo (4 of 6) · tomo slice 28/55.0]
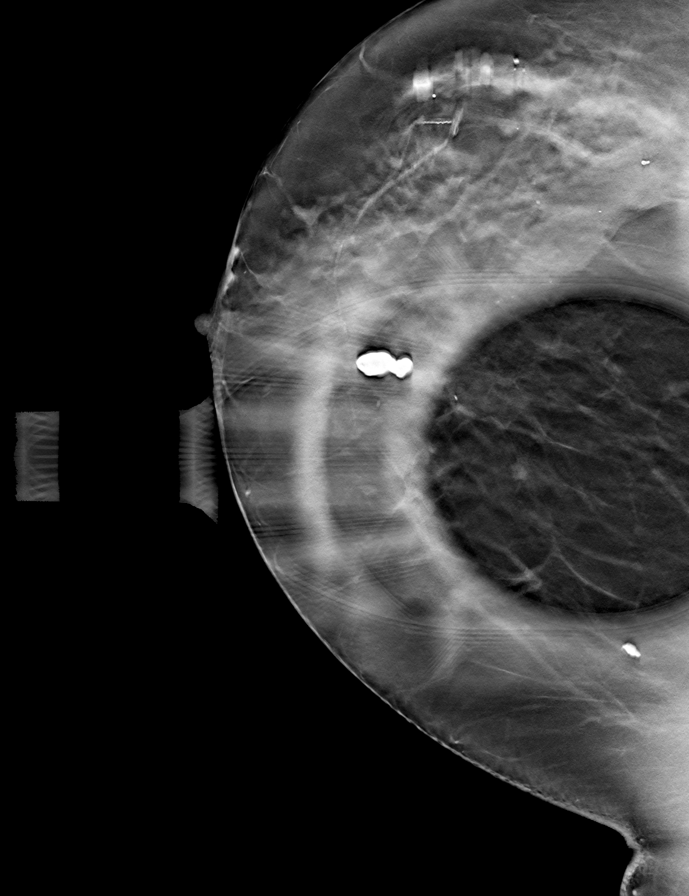

[R MLO tomo (2 of 2) · tomo slice 35/69.0]
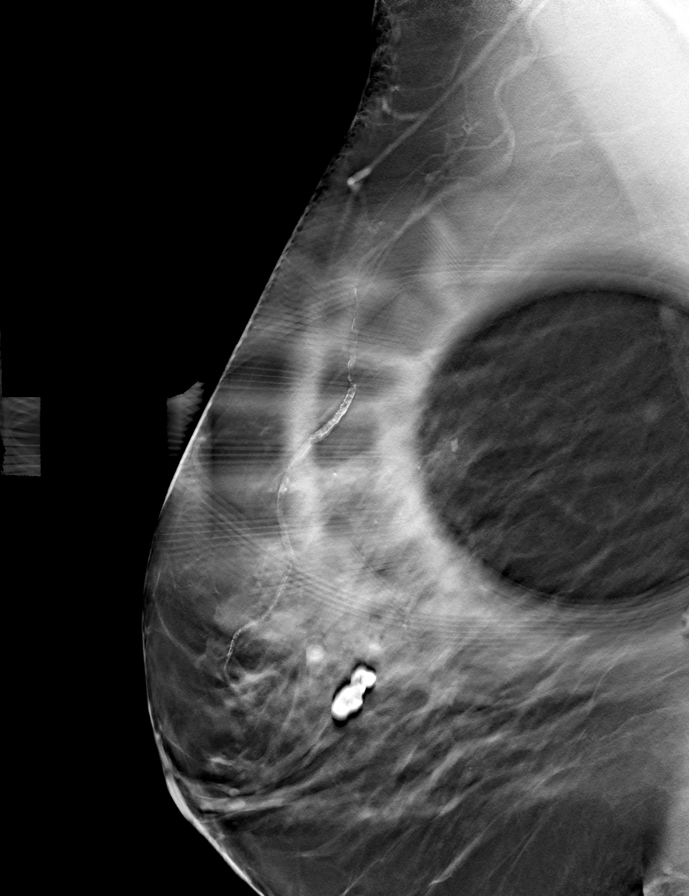

[R CC tomo (5 of 6) · tomo slice 29/58.0]
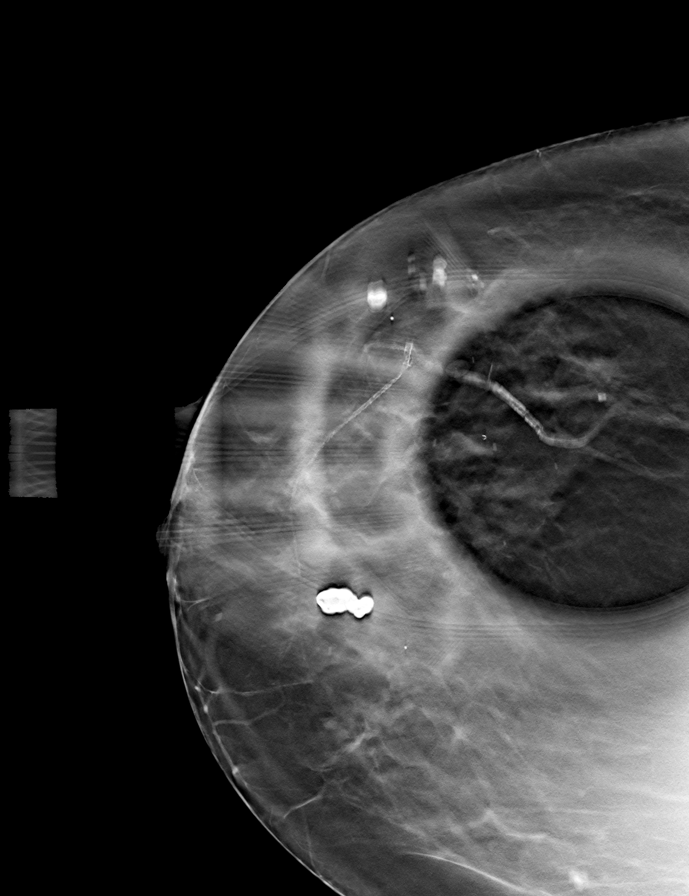

[R CC tomo (6 of 6) · tomo slice 29/58.0]
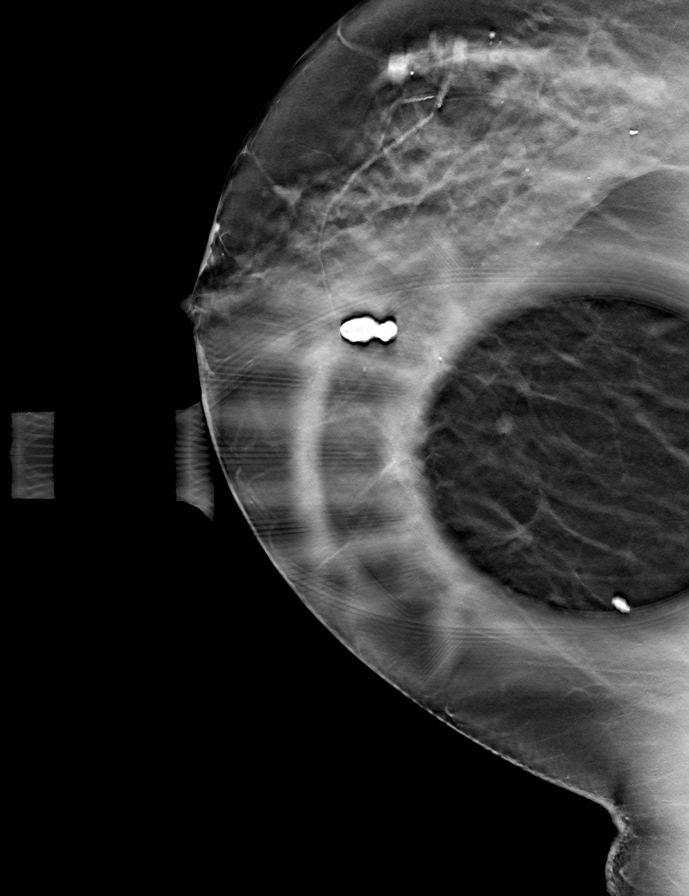

[12 of 28 positions shown; findings below may reference images not displayed]

ACR Breast Density Category c: The breast tissue is heterogeneously
dense, which may obscure small masses.
FINDINGS: There is a persistent ill marginated few mm nodule in the right
breast upper inner quadrant, far posterior depth.

Mammographic images were processed with CAD.

On physical exam, no suspicious masses are found.

Targeted ultrasound is performed, showing right breast 1 o'clock 10
cm from the nipple irregular taller than wide 0.5 x 0.5 x 0.4 cm
nodule with associated dense posterior acoustic shadowing. No
evidence of right axillary lymphadenopathy.
IMPRESSION: Right 1 o'clock small but suspicious nodule, for which
ultrasound-guided core needle biopsy is recommended.

RECOMMENDATION:
Right breast ultrasound-guided core needle biopsy.

I have discussed the findings and recommendations with the patient.
Results were also provided in writing at the conclusion of the
visit. If applicable, a reminder letter will be sent to the patient
regarding the next appointment.

BI-RADS CATEGORY  4: Suspicious.

## 2017-06-26 IMAGING — US US  BREAST BX W/ LOC DEV 1ST LESION IMG BX SPEC US GUIDE*R*
1 series · 8 of 8 positions shown · non-contrast
Comparison: Previous exam(s).

ADDENDUM:
Pathology revealed grade I invasive mammary carcinoma in the right
breast. This was found to be concordant by Dr. Lebala Boitumelo.
Pathology was discussed with the patient by telephone. She reported
doing well after the biopsy with tenderness at the site. Post biopsy
instructions and care were reviewed and her questions were answered.
She has been scheduled at [REDACTED] [REDACTED] on March 03, 2015. The patient is encouraged to come to The
[REDACTED] for educational materials. My
number was provided for additional concerns and questions.

Pathology results reported by Mahaleth Xochi RN, BSN on February 25, 2015.
CLINICAL DATA: Right breast 1 o'clock few mm suspicious nodule.
EXAM:
ULTRASOUND GUIDED RIGHT BREAST CORE NEEDLE BIOPSY

[Series 1: us breast bx w/ loc dev 1st lesion img bx spec us  · 0.06mm/px · 8 of 8 slices shown]
[im 1/8]
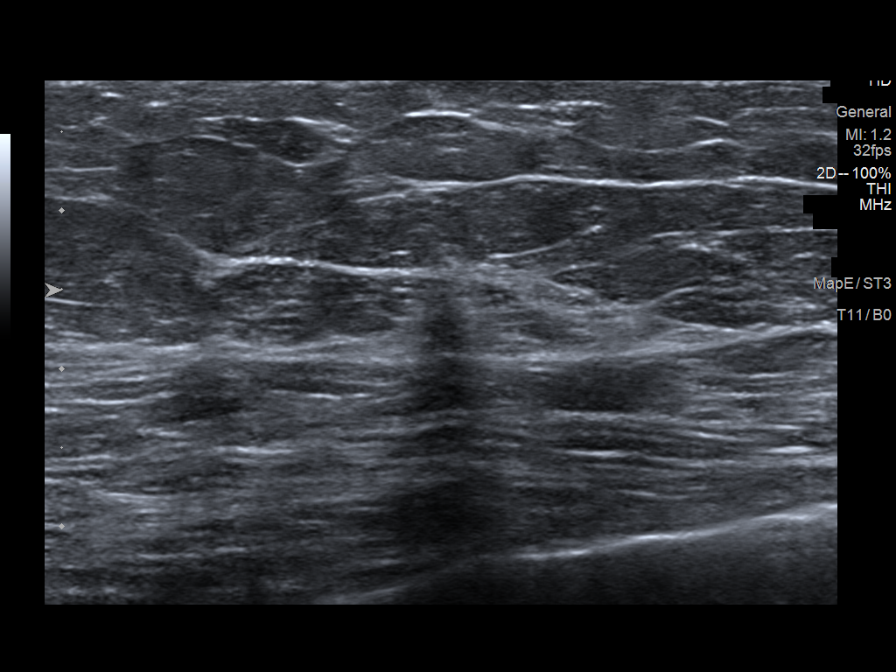
[im 2/8]
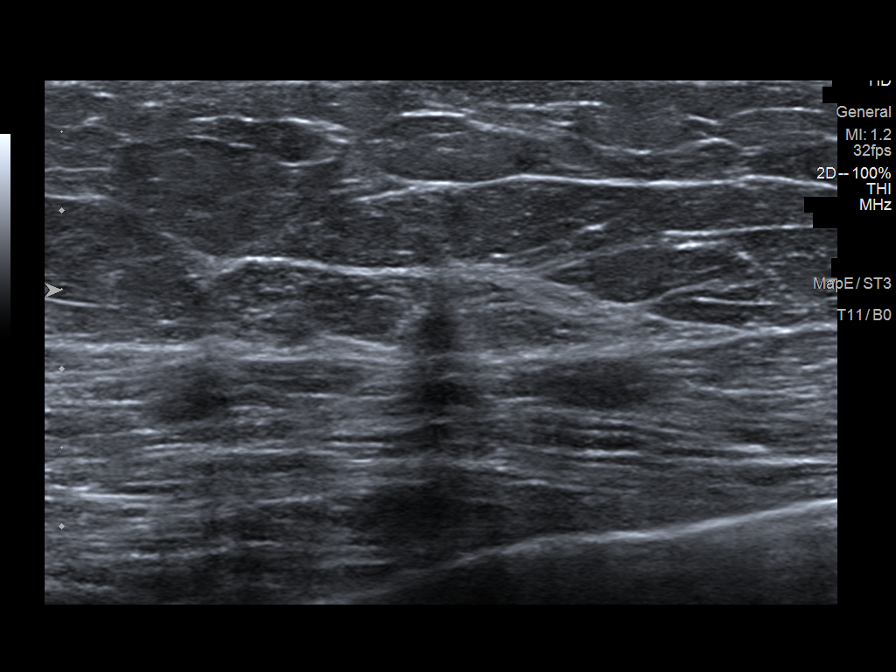
[im 3/8]
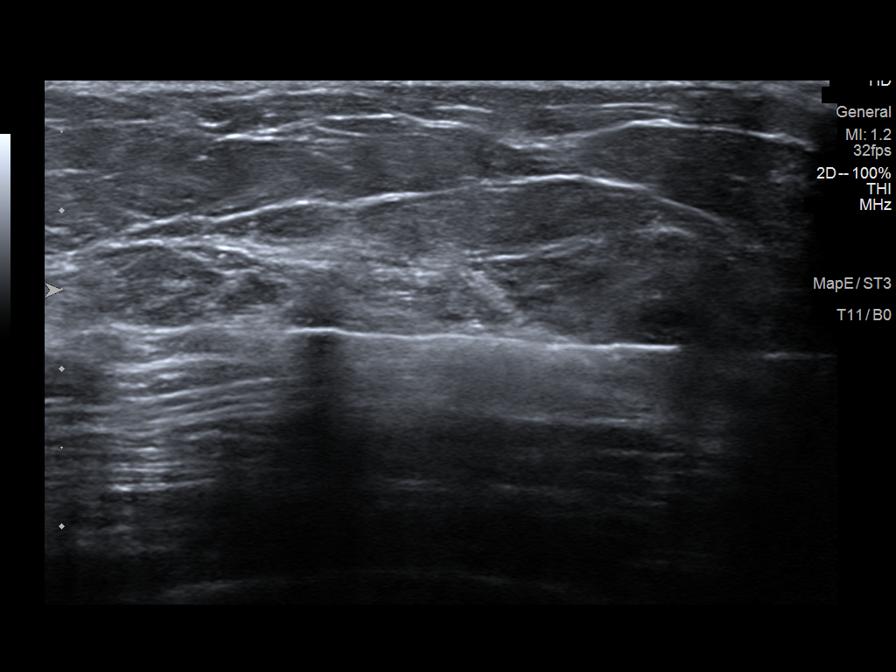
[im 4/8]
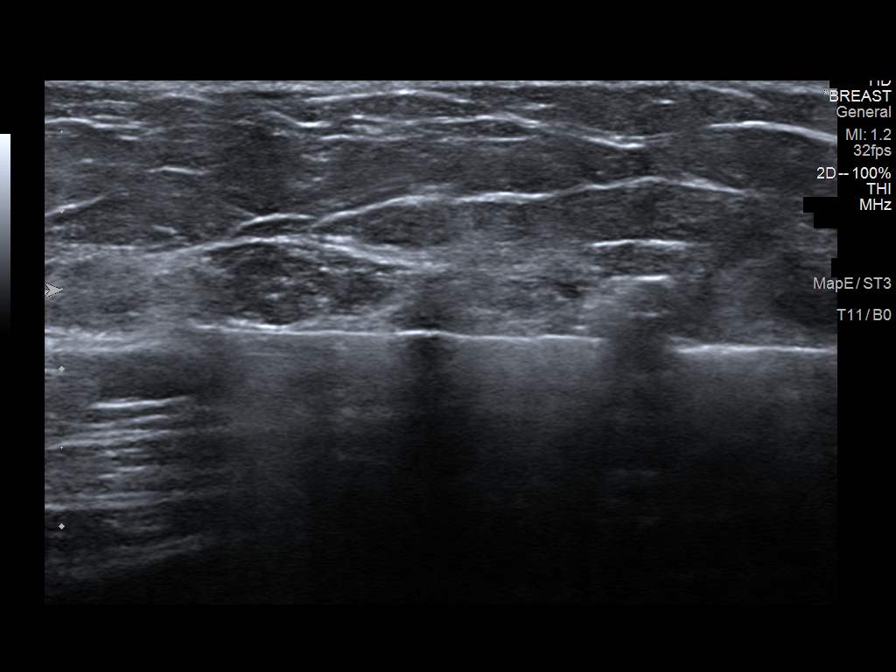
[im 5/8]
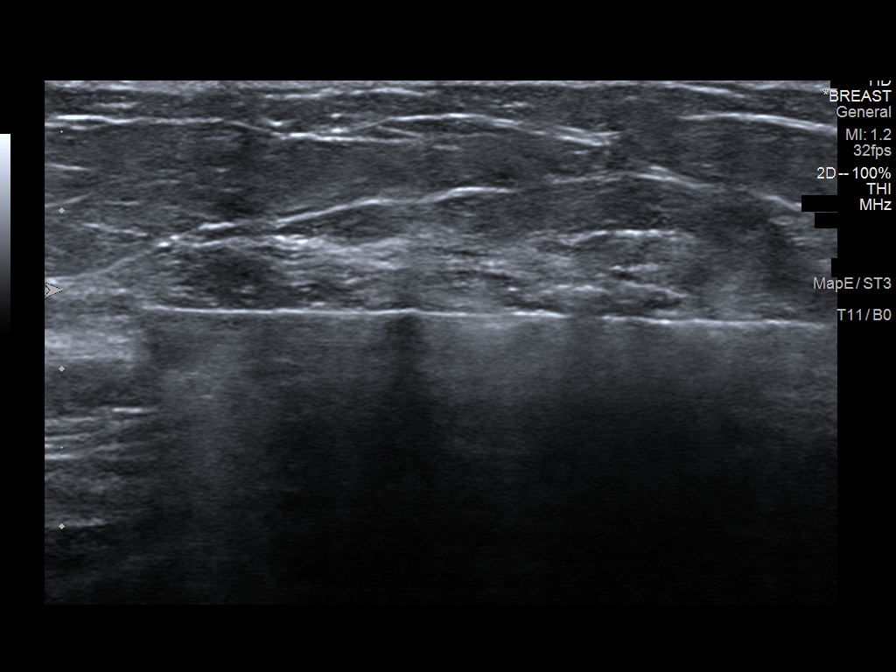
[im 6/8]
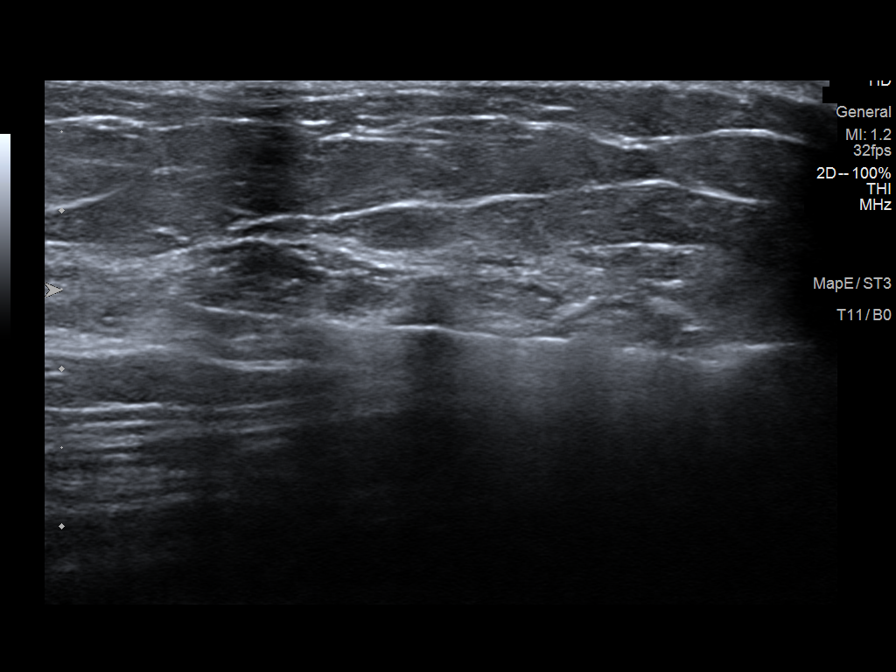
[im 7/8]
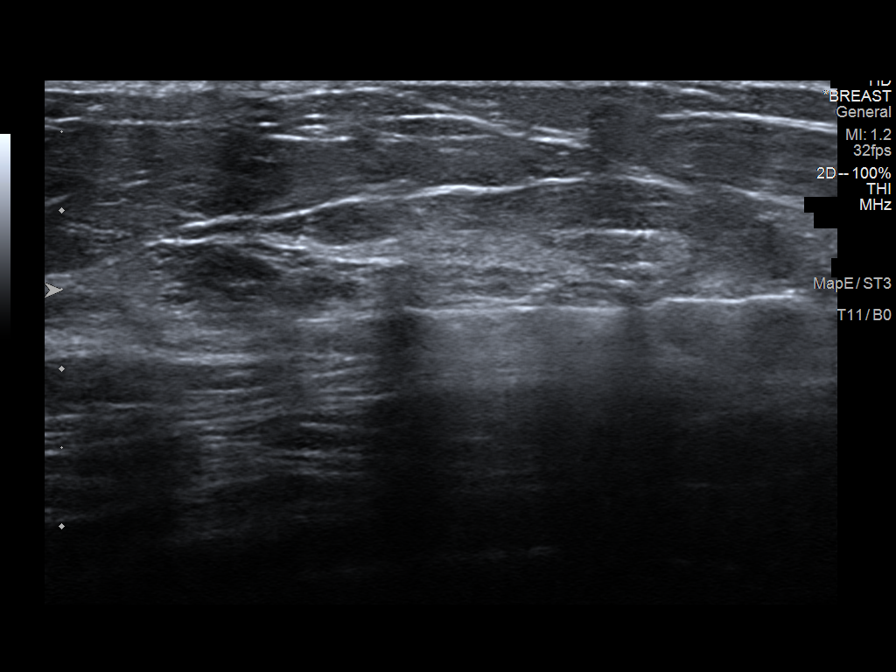
[im 8/8]
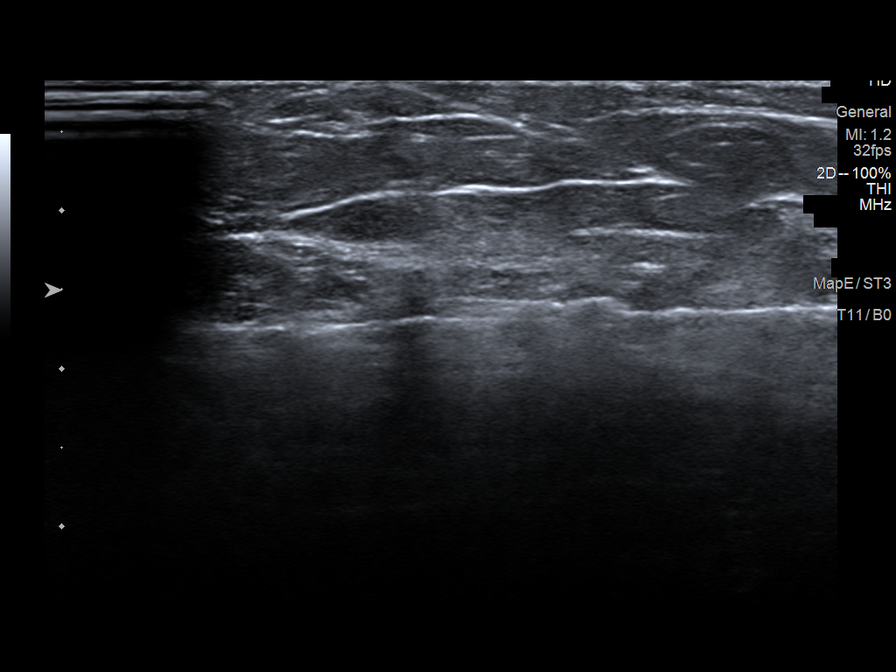

[8 of 8 positions shown; findings below may reference images not displayed]



Using sterile technique and 1% Lidocaine as local anesthetic, under
direct ultrasound visualization, a 14 gauge Hanefi device was
used to perform biopsy of right breast 1 o'clock nodule using a
medial approach. At the conclusion of the procedure a metallic
tissue marker clip was deployed into the biopsy cavity. Follow up 2
view mammogram was performed and dictated separately.
IMPRESSION: Ultrasound guided biopsy of right breast 1 o'clock nodule. No
apparent complications.

## 2017-07-01 ENCOUNTER — Telehealth: Payer: Self-pay | Admitting: Family Medicine

## 2017-07-01 DIAGNOSIS — I10 Essential (primary) hypertension: Secondary | ICD-10-CM

## 2017-07-01 DIAGNOSIS — E559 Vitamin D deficiency, unspecified: Secondary | ICD-10-CM

## 2017-07-01 DIAGNOSIS — E7800 Pure hypercholesterolemia, unspecified: Secondary | ICD-10-CM

## 2017-07-01 DIAGNOSIS — E538 Deficiency of other specified B group vitamins: Secondary | ICD-10-CM

## 2017-07-01 DIAGNOSIS — R739 Hyperglycemia, unspecified: Secondary | ICD-10-CM

## 2017-07-01 NOTE — Telephone Encounter (Signed)
-----   Message from Ellamae Sia sent at 06/27/2017  4:18 PM EST ----- Regarding: Lab orders for Friday, 12.7.18 Patient is scheduled for CPX labs, please order future labs, Thanks , Karna Christmas

## 2017-07-02 DIAGNOSIS — H5213 Myopia, bilateral: Secondary | ICD-10-CM | POA: Diagnosis not present

## 2017-07-06 ENCOUNTER — Other Ambulatory Visit (INDEPENDENT_AMBULATORY_CARE_PROVIDER_SITE_OTHER): Payer: Medicare Other

## 2017-07-06 DIAGNOSIS — E7800 Pure hypercholesterolemia, unspecified: Secondary | ICD-10-CM

## 2017-07-06 DIAGNOSIS — E559 Vitamin D deficiency, unspecified: Secondary | ICD-10-CM

## 2017-07-06 DIAGNOSIS — E538 Deficiency of other specified B group vitamins: Secondary | ICD-10-CM

## 2017-07-06 DIAGNOSIS — Z23 Encounter for immunization: Secondary | ICD-10-CM | POA: Diagnosis not present

## 2017-07-06 DIAGNOSIS — R739 Hyperglycemia, unspecified: Secondary | ICD-10-CM

## 2017-07-06 DIAGNOSIS — I10 Essential (primary) hypertension: Secondary | ICD-10-CM | POA: Diagnosis not present

## 2017-07-06 LAB — TSH: TSH: 4.21 u[IU]/mL (ref 0.35–4.50)

## 2017-07-06 LAB — LIPID PANEL
CHOLESTEROL: 176 mg/dL (ref 0–200)
HDL: 46.9 mg/dL (ref >39.00)
LDL Cholesterol: 110 mg/dL — ABNORMAL HIGH (ref 0–99)
NonHDL: 129.13
Total CHOL/HDL Ratio: 4
Triglycerides: 96 mg/dL (ref 0.0–149.0)
VLDL: 19.2 mg/dL (ref 0.0–40.0)

## 2017-07-06 LAB — COMPREHENSIVE METABOLIC PANEL
ALBUMIN: 3.8 g/dL (ref 3.5–5.2)
ALT: 16 U/L (ref 0–35)
AST: 20 U/L (ref 0–37)
Alkaline Phosphatase: 63 U/L (ref 39–117)
BUN: 16 mg/dL (ref 6–23)
CALCIUM: 8.6 mg/dL (ref 8.4–10.5)
CHLORIDE: 100 meq/L (ref 96–112)
CO2: 31 mEq/L (ref 19–32)
Creatinine, Ser: 0.82 mg/dL (ref 0.40–1.20)
GFR: 73.2 mL/min (ref >60.00)
Glucose, Bld: 129 mg/dL — ABNORMAL HIGH (ref 70–99)
POTASSIUM: 4.2 meq/L (ref 3.5–5.1)
SODIUM: 139 meq/L (ref 135–145)
Total Bilirubin: 0.3 mg/dL (ref 0.2–1.2)
Total Protein: 6.8 g/dL (ref 6.0–8.3)

## 2017-07-06 LAB — CBC WITH DIFFERENTIAL/PLATELET
BASOS PCT: 0.7 % (ref 0.0–3.0)
Basophils Absolute: 0 10*3/uL (ref 0.0–0.1)
EOS PCT: 6 % — AB (ref 0.0–5.0)
Eosinophils Absolute: 0.3 10*3/uL (ref 0.0–0.7)
HCT: 38.4 % (ref 36.0–46.0)
HEMOGLOBIN: 12.6 g/dL (ref 12.0–15.0)
LYMPHS ABS: 2 10*3/uL (ref 0.7–4.0)
Lymphocytes Relative: 35.4 % (ref 12.0–46.0)
MCHC: 32.9 g/dL (ref 30.0–36.0)
MCV: 90.2 fl (ref 78.0–100.0)
MONO ABS: 0.4 10*3/uL (ref 0.1–1.0)
Monocytes Relative: 7.9 % (ref 3.0–12.0)
Neutro Abs: 2.8 10*3/uL (ref 1.4–7.7)
Neutrophils Relative %: 50 % (ref 43.0–77.0)
Platelets: 262 10*3/uL (ref 150.0–400.0)
RBC: 4.25 Mil/uL (ref 3.87–5.11)
RDW: 14.8 % (ref 11.5–15.5)
WBC: 5.7 10*3/uL (ref 4.0–10.5)

## 2017-07-06 LAB — VITAMIN D 25 HYDROXY (VIT D DEFICIENCY, FRACTURES): VITD: 45.3 ng/mL (ref 30.00–100.00)

## 2017-07-06 LAB — VITAMIN B12: Vitamin B-12: 433 pg/mL (ref 211–911)

## 2017-07-06 LAB — HEMOGLOBIN A1C: Hgb A1c MFr Bld: 6 % (ref 4.6–6.5)

## 2017-07-12 ENCOUNTER — Other Ambulatory Visit: Payer: Self-pay | Admitting: Family Medicine

## 2017-07-16 ENCOUNTER — Encounter: Payer: Self-pay | Admitting: Family Medicine

## 2017-07-16 ENCOUNTER — Ambulatory Visit (INDEPENDENT_AMBULATORY_CARE_PROVIDER_SITE_OTHER): Payer: Medicare Other | Admitting: Family Medicine

## 2017-07-16 VITALS — BP 135/78 | HR 61 | Temp 97.6°F | Ht 63.5 in | Wt 197.5 lb

## 2017-07-16 DIAGNOSIS — E538 Deficiency of other specified B group vitamins: Secondary | ICD-10-CM

## 2017-07-16 DIAGNOSIS — E559 Vitamin D deficiency, unspecified: Secondary | ICD-10-CM | POA: Diagnosis not present

## 2017-07-16 DIAGNOSIS — R739 Hyperglycemia, unspecified: Secondary | ICD-10-CM

## 2017-07-16 DIAGNOSIS — C50211 Malignant neoplasm of upper-inner quadrant of right female breast: Secondary | ICD-10-CM

## 2017-07-16 DIAGNOSIS — Z170 Estrogen receptor positive status [ER+]: Secondary | ICD-10-CM | POA: Diagnosis not present

## 2017-07-16 DIAGNOSIS — E7800 Pure hypercholesterolemia, unspecified: Secondary | ICD-10-CM

## 2017-07-16 DIAGNOSIS — Z0000 Encounter for general adult medical examination without abnormal findings: Secondary | ICD-10-CM | POA: Diagnosis not present

## 2017-07-16 DIAGNOSIS — E6609 Other obesity due to excess calories: Secondary | ICD-10-CM | POA: Diagnosis not present

## 2017-07-16 DIAGNOSIS — I10 Essential (primary) hypertension: Secondary | ICD-10-CM

## 2017-07-16 DIAGNOSIS — Z6834 Body mass index (BMI) 34.0-34.9, adult: Secondary | ICD-10-CM | POA: Diagnosis not present

## 2017-07-16 MED ORDER — OMEPRAZOLE 20 MG PO CPDR: 20.0000 mg | DELAYED_RELEASE_CAPSULE | Freq: Every day | ORAL | 3 refills | Status: DC

## 2017-07-16 MED ORDER — HYDROCHLOROTHIAZIDE 25 MG PO TABS: 25.0000 mg | ORAL_TABLET | Freq: Every day | ORAL | 3 refills | Status: DC

## 2017-07-16 NOTE — Assessment & Plan Note (Signed)
Doing well with f/u and tamoxifen  Has had a hysterectomy  utd screening

## 2017-07-16 NOTE — Assessment & Plan Note (Signed)
Discussed how this problem influences overall health and the risks it imposes  Reviewed plan for weight loss with lower calorie diet (via better food choices and also portion control or program like weight watchers) and exercise building up to or more than 30 minutes 5 days per week including some aerobic activity   S/p gastric bypass in the past

## 2017-07-16 NOTE — Assessment & Plan Note (Signed)
Improved Vitamin D level is therapeutic with current supplementation Disc importance of this to bone and overall health Level in 40s

## 2017-07-16 NOTE — Assessment & Plan Note (Signed)
bp in fair control at this time  BP Readings from Last 1 Encounters:  07/16/17 135/78   No changes needed Disc lifstyle change with low sodium diet and exercise  Labs reviewed  Enc wt loss and fitness

## 2017-07-16 NOTE — Assessment & Plan Note (Signed)
utd screening  Reviewed health habits including diet and exercise and skin cancer prevention Reviewed appropriate screening tests for age  Also reviewed health mt list, fam hx and immunization status , as well as social and family history   Labs rev  See HPI

## 2017-07-16 NOTE — Patient Instructions (Addendum)
If you are interested in the Shingrix vaccine- call your pharmacy to ask about availability and cost   You are pre diabetic  Try to get most of your carbohydrates from produce (with the exception of white potatoes)  Eat less bread/pasta/rice/snack foods/cereals/sweets and other items from the middle of the grocery store (processed carbs)   Keep exercising  Labs are stable overall

## 2017-07-16 NOTE — Assessment & Plan Note (Signed)
Disc goals for lipids and reasons to control them Rev labs with pt Rev low sat fat diet in detail Imp LDL with better diet

## 2017-07-16 NOTE — Progress Notes (Signed)
Subjective:    Patient ID: Jennifer Bartlett, female    DOB: Mar 12, 1946, 71 y.o.   MRN: [id number]  HPI  Here for health maintenance exam and to review chronic medical problems   Doing well and feeling well    Wt Readings from Last 3 Encounters:  07/16/17 197 lb 8 oz (89.6 kg)  06/01/17 196 lb 8 oz (89.1 kg)  07/04/16 196 lb 8 oz (89.1 kg)  wt is stable  H/o gastric bypass surgery Walking for exercise/also active lifestyle with caregiver  34.44 kg/m  Has not had amw  Hx of est rec pos breast cancer  Tamoxifen now Mammogram 7/17 - has an appt next week  No issues- just stays sore where the surgery was  Sees surgeon next week as well  Has had a hysterectomy   No gyn problems  Chronic d/c from her tamoxifen   Did cologuard 12/17-nl   dexa 6/17 nl  Vit D level 45.3 - takes her ca and D  Not due yet  No falls or fx this year    Zoster status- zostavax 11/11 She has also had shingles    bp is up first check today Was high in the oncol office last mo also  No cp or palpitations or headaches or edema  No side effects to medicines  BP Readings from Last 3 Encounters:  07/16/17 140/64  06/01/17 (!) 165/71  07/04/16 134/86      B12 def  Lab Results  Component Value Date   VITAMINB12 433 07/06/2017  level is ok w/o shots (has been 4-6 mo) She takes vitamins with B12 also    Hyperlipidemia Lab Results  Component Value Date   CHOL 176 07/06/2017   CHOL 208 (H) 06/30/2016   CHOL 202 (H) 08/17/2014   Lab Results  Component Value Date   HDL 46.90 07/06/2017   HDL 54.70 06/30/2016   HDL 56.90 08/17/2014   Lab Results  Component Value Date   LDLCALC 110 (H) 07/06/2017   LDLCALC 134 (H) 06/30/2016   LDLCALC 127 (H) 08/17/2014   Lab Results  Component Value Date   TRIG 96.0 07/06/2017   TRIG 99.0 06/30/2016   TRIG 92.0 08/17/2014   Lab Results  Component Value Date   CHOLHDL 4 07/06/2017   CHOLHDL 4 06/30/2016   CHOLHDL 4 08/17/2014   Lab  Results  Component Value Date   LDLDIRECT 139.2 08/25/2013   LDLDIRECT 151.5 03/31/2011   LDL is improved with diet  She is eating less bread  Trig good too  HDL down a bit  No fried food or red meat    Hyperglycemia Lab Results  Component Value Date   HGBA1C 6.0 07/06/2017  prediabetes  Laid off bread   Lab Results  Component Value Date   CREATININE 0.82 07/06/2017   BUN 16 07/06/2017   NA 139 07/06/2017   K 4.2 07/06/2017   CL 100 07/06/2017   CO2 31 07/06/2017   Lab Results  Component Value Date   ALT 16 07/06/2017   AST 20 07/06/2017   ALKPHOS 63 07/06/2017   BILITOT 0.3 07/06/2017   Lab Results  Component Value Date   WBC 5.7 07/06/2017   HGB 12.6 07/06/2017   HCT 38.4 07/06/2017   MCV 90.2 07/06/2017   PLT 262.0 07/06/2017   Patient Active Problem List   Diagnosis Date Noted  . Hyperglycemia 07/04/2016  . Routine general medical examination at a health care facility 07/04/2016  .  Encounter for hepatitis C screening test for low risk patient 06/29/2016  . Estrogen deficiency 01/11/2016  . Malignant neoplasm of upper-inner quadrant of right breast in female, estrogen receptor positive ([place]) 02/26/2015  . Colon cancer screening 09/06/2012  . Vitamin D deficiency 12/28/2009  . STRESS REACTION, ACUTE, WITH EMOTIONAL DISTURBANCE 12/07/2009  . HYPOGLYCEMIA NOS 05/28/2007  . Vitamin B12 deficiency 03/07/2007  . HYPERCHOLESTEROLEMIA 03/07/2007  . Obesity 03/07/2007  . Essential hypertension 03/07/2007  . GERD 03/07/2007   Past Medical History:  Diagnosis Date  . Anemia   . Anxiety   . Breast cancer of upper-inner quadrant of right female breast ([place]) 02/26/2015  . GERD (gastroesophageal reflux disease)   . Hot flashes   . Hyperlipidemia   . Hypertension   . Hypoglycemia   . Obesity   . Vitamin B 12 deficiency    Past Surgical History:  Procedure Laterality Date  . ABDOMINAL HYSTERECTOMY    . GASTRIC BYPASS  08/2003  . HERNIA REPAIR    . KNEE  SURGERY     left  . PARTIAL HYSTERECTOMY  1990's   bleeding  . RADIOACTIVE SEED GUIDED PARTIAL MASTECTOMY WITH AXILLARY SENTINEL LYMPH NODE BIOPSY Right 03/09/2015   Procedure: RADIOACTIVE SEED GUIDED RIGHT PARTIAL MASTECTOMY WITH AXILLARY SENTINEL LYMPH NODE BIOPSY;  Surgeon: Stark Klein, MD;  Location: [facility];  Service: General;  Laterality: Right;  . TUBAL LIGATION     Social History   Tobacco Use  . Smoking status: Never Smoker  . Smokeless tobacco: Never Used  Substance Use Topics  . Alcohol use: No  . Drug use: No   Family History  Problem Relation Age of Onset  . Heart disease Mother        MI  . Hypertension Mother   . Alcohol abuse Father   . Heart disease Father        smoker  . Hypertension Father   . Breast cancer Paternal Grandmother   . Diabetes Maternal Aunt    Allergies  Allergen Reactions  . Crestor [Rosuvastatin Calcium] Other (See Comments)    Myalgia   . Lipitor [Atorvastatin Calcium] Other (See Comments)    Aches and pains  . Penicillins Shortness Of Breath and Swelling  . Zocor [Simvastatin - High Dose] Other (See Comments)    Muscle/joint aches   Current Outpatient Medications on File Prior to Visit  Medication Sig Dispense Refill  . BIOTIN PO Take 1 tablet by mouth daily.    Marland Kitchen CALCIUM PO Take by mouth daily.      . cholecalciferol (VITAMIN D) 400 UNITS TABS Take 400 Units by mouth daily.     . cloNIDine (CATAPRES - DOSED IN MG/24 HR) 0.1 mg/24hr patch Place 1 patch (0.1 mg total) onto the skin once a week. 4 patch 12  . gabapentin (NEURONTIN) 300 MG capsule Take 1 capsule (300 mg total) by mouth at bedtime. 90 capsule 4  . loratadine (CLARITIN) 10 MG tablet Take 10 mg by mouth daily.    . Multiple Vitamin (MULTIVITAMIN) capsule Take 1 capsule by mouth daily.      . tamoxifen (NOLVADEX) 20 MG tablet Take 1 tablet (20 mg total) by mouth daily. 90 tablet 4  . TURMERIC PO Take 1 capsule by mouth 3 (three) times daily.    Marland Kitchen  venlafaxine XR (EFFEXOR-XR) 150 MG 24 hr capsule Take 1 capsule (150 mg total) by mouth daily with breakfast. 90 capsule 4   No current facility-administered medications on  file prior to visit.      Review of Systems  Constitutional: Negative for activity change, appetite change, fatigue, fever and unexpected weight change.  HENT: Negative for congestion, ear pain, rhinorrhea, sinus pressure and sore throat.   Eyes: Negative for pain, redness and visual disturbance.  Respiratory: Negative for cough, shortness of breath and wheezing.   Cardiovascular: Negative for chest pain and palpitations.  Gastrointestinal: Negative for abdominal pain, blood in stool, constipation and diarrhea.  Endocrine: Negative for polydipsia and polyuria.  Genitourinary: Negative for dysuria, frequency and urgency.  Musculoskeletal: Negative for arthralgias, back pain and myalgias.  Skin: Negative for pallor and rash.  Allergic/Immunologic: Negative for environmental allergies.  Neurological: Negative for dizziness, syncope and headaches.  Hematological: Negative for adenopathy. Does not bruise/bleed easily.  Psychiatric/Behavioral: Negative for decreased concentration and dysphoric mood. The patient is not nervous/anxious.        Objective:   Physical Exam  Constitutional: She appears well-developed and well-nourished. No distress.  obese and well appearing   HENT:  Head: Normocephalic and atraumatic.  Right Ear: External ear normal.  Left Ear: External ear normal.  Mouth/Throat: Oropharynx is clear and moist.  Eyes: Conjunctivae and EOM are normal. Pupils are equal, round, and reactive to light. No scleral icterus.  Neck: Normal range of motion. Neck supple. No JVD present. Carotid bruit is not present. No thyromegaly present.  Cardiovascular: Normal rate, regular rhythm, normal heart sounds and intact distal pulses. Exam reveals no gallop.  Pulmonary/Chest: Effort normal and breath sounds normal. No  respiratory distress. She has no wheezes. She exhibits no tenderness.  Abdominal: Soft. Bowel sounds are normal. She exhibits no distension, no abdominal bruit and no mass. There is no tenderness.  Genitourinary: No breast swelling, tenderness, discharge or bleeding.  Genitourinary Comments: Breast exam: No mass, nodules, thickening, tenderness, bulging, retraction, inflamation, nipple discharge or skin changes noted.  No axillary or clavicular LA.     Baseline surgical change in R breast   Musculoskeletal: Normal range of motion. She exhibits no edema or tenderness.  Lymphadenopathy:    She has no cervical adenopathy.  Neurological: She is alert. She has normal reflexes. No cranial nerve deficit. She exhibits normal muscle tone. Coordination normal.  Skin: Skin is warm and dry. No rash noted. No erythema. No pallor.  Psychiatric: She has a normal mood and affect.          Assessment & Plan:   Problem List Items Addressed This Visit      Cardiovascular and Mediastinum   Essential hypertension - Primary    bp in fair control at this time  BP Readings from Last 1 Encounters:  07/16/17 135/78   No changes needed Disc lifstyle change with low sodium diet and exercise  Labs reviewed  Enc wt loss and fitness      Relevant Medications   hydrochlorothiazide (HYDRODIURIL) 25 MG tablet     Other   HYPERCHOLESTEROLEMIA    Disc goals for lipids and reasons to control them Rev labs with pt Rev low sat fat diet in detail Imp LDL with better diet       Relevant Medications   hydrochlorothiazide (HYDRODIURIL) 25 MG tablet   Hyperglycemia    Lab Results  Component Value Date   HGBA1C 6.0 07/06/2017   Will continue to watch disc imp of low glycemic diet and wt loss to prevent DM2       Malignant neoplasm of upper-inner quadrant of right breast in female, estrogen  receptor positive ([address])    Doing well with f/u and tamoxifen  Has had a hysterectomy  utd screening         Obesity    Discussed how this problem influences overall health and the risks it imposes  Reviewed plan for weight loss with lower calorie diet (via better food choices and also portion control or program like weight watchers) and exercise building up to or more than 30 minutes 5 days per week including some aerobic activity   S/p gastric bypass in the past       Routine general medical examination at a health care facility    utd screening  Reviewed health habits including diet and exercise and skin cancer prevention Reviewed appropriate screening tests for age  Also reviewed health mt list, fam hx and immunization status , as well as social and family history   Labs rev  See HPI      Vitamin B12 deficiency    tx level on oral supplementation alone  S/p bariatric surgery Continue to follow       Vitamin D deficiency    Improved Vitamin D level is therapeutic with current supplementation Disc importance of this to bone and overall health Level in 4s

## 2017-07-16 NOTE — Assessment & Plan Note (Signed)
Lab Results  Component Value Date   HGBA1C 6.0 07/06/2017   Will continue to watch disc imp of low glycemic diet and wt loss to prevent DM2

## 2017-07-16 NOTE — Assessment & Plan Note (Signed)
tx level on oral supplementation alone  S/p bariatric surgery Continue to follow

## 2017-07-18 ENCOUNTER — Other Ambulatory Visit: Payer: Self-pay | Admitting: Oncology

## 2017-07-18 ENCOUNTER — Ambulatory Visit
Admission: RE | Admit: 2017-07-18 | Discharge: 2017-07-18 | Disposition: A | Discharge status: Home / Self Care | Payer: Medicare Other | Source: Ambulatory Visit | Attending: Oncology | Admitting: Oncology

## 2017-07-18 DIAGNOSIS — Z139 Encounter for screening, unspecified: Secondary | ICD-10-CM

## 2017-07-18 DIAGNOSIS — Z853 Personal history of malignant neoplasm of breast: Secondary | ICD-10-CM

## 2017-10-26 ENCOUNTER — Ambulatory Visit
Admission: RE | Admit: 2017-10-26 | Discharge: 2017-10-26 | Disposition: A | Discharge status: Home / Self Care | Payer: Medicare Other | Source: Ambulatory Visit | Attending: Oncology | Admitting: Oncology

## 2017-10-26 DIAGNOSIS — R922 Inconclusive mammogram: Secondary | ICD-10-CM | POA: Diagnosis not present

## 2017-10-26 DIAGNOSIS — Z853 Personal history of malignant neoplasm of breast: Secondary | ICD-10-CM

## 2017-10-26 HISTORY — DX: Malignant neoplasm of unspecified site of unspecified female breast: C50.919

## 2017-10-26 HISTORY — DX: Personal history of irradiation: Z92.3

## 2018-04-21 DIAGNOSIS — Z23 Encounter for immunization: Secondary | ICD-10-CM | POA: Diagnosis not present

## 2018-05-31 ENCOUNTER — Other Ambulatory Visit: Payer: Self-pay

## 2018-05-31 DIAGNOSIS — Z170 Estrogen receptor positive status [ER+]: Secondary | ICD-10-CM

## 2018-05-31 DIAGNOSIS — C50211 Malignant neoplasm of upper-inner quadrant of right female breast: Secondary | ICD-10-CM

## 2018-05-31 NOTE — Progress Notes (Signed)
[facility]  [phone] [phone]     ID: Jennifer Bartlett DOB: 10-18-1945  MR#: [id number]  JYN#:[id number]  Patient Care Team: Judy Pimple, MD as PCP - General [facility] [place] Davonna Belling, NP as Nurse Practitioner (Obstetrics and Gynecology) Almond Lint, MD as Consulting Physician (General Surgery) Rushi Chasen, Valentino Hue, MD as Consulting Physician (Oncology) Pershing Proud, RN as Registered Nurse Donnelly Angelica, RN as Registered Nurse Damita Lack, Marcy Salvo, NP as Nurse Practitioner (Nurse Practitioner) Dannielle Huh, MD as Consulting Physician (Orthopedic Surgery) PCP: Judy Pimple, MD OTHER MD:  CHIEF COMPLAINT: Estrogen receptor positive breast cancer  CURRENT TREATMENT:  TAMOXIFEN   BREAST CANCER HISTORY:  From the original intake note:  [name] underwent bilateral screening [facility]. This showed a possible mass in the right breast. She was recalled for diagnostic right mammography with tomosynthesis and right breast ultrasonography 02/17/2015. This found the breast density to be category C. In the right breast upper inner quadrant there was a persistent poorly marginated nodule which was not palpable. By ultrasound this measured 0.5 cm. There was no evidence of right axillary lymphadenopathy.  Biopsy of this mass 02/24/2015 showed ([id number]) an invasive lobular breast cancer, E-cadherin negative, grade 1, estrogen receptor 95% positive, progesterone receptor 60% positive, both with strong staining intensity, with an MIB-1 of 3%, and no HER-2 amplification, the signals ratio being 1.29 and the number per cell 2.00.  Her subsequent history is as detailed below  INTERVAL HISTORY: Jennifer Bartlett returns today for follow-up and treatment of her estrogen receptor positive breast cancer. She continues on tamoxifen,  which she tolerates well. She continues to have severe hot flashes. She was on the TTS-1  patch but stopped taking it due to it causing her to break out in a rash.  Since her visit she has had Diagnostic mammogram on 10/26/2017 that showed: Expected surgical changes status post right breast lumpectomy. No mammographic evidence of malignancy in the bilateral breasts.   REVIEW OF SYSTEMS: Jennifer Bartlett reports that for exercise, she walks with her grandchildren. Is trying to help her family move out of her house and get them a house on her property. She has a runny clear drainage and takes Claritin to alleviate her symptoms. She has had a rash break out in her shoulder and notes that she spend a lot of time gardening. She denies unusual headaches, visual changes, nausea, vomiting, or dizziness. There has been no unusual cough, phlegm production, or pleurisy. This been no change in bowel or bladder habits. She denies unexplained fatigue or unexplained weight loss, bleeding, rash, or fever. A detailed review of systems was otherwise stable.   PAST MEDICAL HISTORY: Past Medical History:  Diagnosis Date  . Anemia   . Anxiety   . Breast cancer ([facility])   . 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  . GERD (gastroesophageal reflux disease)   . Hot flashes   . Hyperlipidemia   . Hypertension   . Hypoglycemia   . Obesity   . Personal history of radiation therapy   . Vitamin B 12 deficiency     PAST SURGICAL HISTORY: Past Surgical History:  Procedure Laterality Date  . ABDOMINAL HYSTERECTOMY    . BREAST BIOPSY    . BREAST LUMPECTOMY Right    2016  . GASTRIC BYPASS  08/2003  . HERNIA REPAIR    . KNEE SURGERY     left  .  PARTIAL HYSTERECTOMY  1990's   bleeding  . RADIOACTIVE SEED GUIDED PARTIAL MASTECTOMY WITH AXILLARY SENTINEL LYMPH NODE BIOPSY Right 03/09/2015   Procedure: RADIOACTIVE SEED GUIDED RIGHT PARTIAL MASTECTOMY WITH AXILLARY SENTINEL LYMPH NODE BIOPSY;  Surgeon: Almond Lint, MD;  Location: [facility];  Service: General;   Laterality: Right;  . TUBAL LIGATION      FAMILY HISTORY Family History  Problem Relation Age of Onset  . Heart disease Mother        MI  . Hypertension Mother   . Alcohol abuse Father   . Heart disease Father        smoker  . Hypertension Father   . Breast cancer Paternal Grandmother   . Diabetes Maternal Aunt    the patient's father died at the age of 77 and her mother at the age of 47, both from heart attacks in the setting of tobacco abuse. The patient has 3 brothers, one sister. The patient's paternal grandmother may have been diagnosed with breast cancer in her late 70s, but that is not certain. There is no other history of breast or ovarian cancer in the family to the patient's knowledge.  GYNECOLOGIC HISTORY:  Patient's last menstrual period was 06/08/1989. Menarche age 72, first live birth age 72; the patient is GX P2. She underwent hysterectomy without salpingo-oophorectomy more than 20 years ago. She has been on hormone replacement more than 20 years. She understands this does increase the risk of developing breast cancer. This was stopped July 2016.  SOCIAL HISTORY: (Updated 06/01/17) Remedy used to work for [employer] but is now retired. Her husband Merida Slates (goes by Jennifer Bartlett) worked 49 years for [employer]. The patient's daughter Jennifer Bartlett lives in [place] and is Interior and spatial designer of the afterschool program in [place]. The patient's son Jennifer Bartlett lives in [place] and works for [employer], a company that cleans up after disaster or certain crimes. The patient also has an adopted daughter with knee Jennifer Bartlett who lives in [place] and is a homemaker. The patient has 4 blood grandchildren and 2 adopted grandchildren. At home its the patient and her husband, her daughter and her husband and 2 grandchildren. Jennifer Bartlett attends a [employer]    ADVANCED DIRECTIVES: Not in place   HEALTH MAINTENANCE: Social History   Tobacco Use  . Smoking status:  Never Smoker  . Smokeless tobacco: Never Used  Substance Use Topics  . Alcohol use: No  . Drug use: No     Colonoscopy: Never (status post bypass surgery)  PAP: 2015  Bone density: c. 2012; normal  Lipid panel:  Allergies  Allergen Reactions  . Crestor [Rosuvastatin Calcium] Other (See Comments)    Myalgia   . Lipitor [Atorvastatin Calcium] Other (See Comments)    Aches and pains  . Penicillins Shortness Of Breath and Swelling  . Zocor [Simvastatin - High Dose] Other (See Comments)    Muscle/joint aches    Current Outpatient Medications  Medication Sig Dispense Refill  . BIOTIN PO Take 1 tablet by mouth daily.    Marland Kitchen CALCIUM PO Take by mouth daily.      . cholecalciferol (VITAMIN D) 400 UNITS TABS Take 400 Units by mouth daily.     Marland Kitchen gabapentin (NEURONTIN) 300 MG capsule Take 1-2 capsules (300-600 mg total) by mouth at bedtime. 90 capsule 4  . hydrochlorothiazide (HYDRODIURIL) 25 MG tablet Take 1 tablet (25 mg total) by mouth daily. 90 tablet 3  . loratadine (CLARITIN) 10 MG tablet  Take 10 mg by mouth daily.    . Multiple Vitamin (MULTIVITAMIN) capsule Take 1 capsule by mouth daily.      Marland Kitchen omeprazole (PRILOSEC) 20 MG capsule Take 1 capsule (20 mg total) by mouth daily. 90 capsule 3  . tamoxifen (NOLVADEX) 20 MG tablet Take 1 tablet (20 mg total) by mouth daily. 90 tablet 4  . TURMERIC PO Take 1 capsule by mouth 3 (three) times daily.    Marland Kitchen venlafaxine XR (EFFEXOR-XR) 150 MG 24 hr capsule Take 1 capsule (150 mg total) by mouth daily with breakfast. 90 capsule 4   No current facility-administered medications for this visit.     OBJECTIVE: Middle-aged white woman in no acute distress  Vitals:   06/03/18 0942  BP: (!) 170/76  Pulse: 81  Resp: 18  Temp: 97.9 F (36.6 C)  SpO2: 99%     Body mass index is 35.2 kg/m.    ECOG FS:1 - Symptomatic but completely ambulatory  Sclerae unicteric, EOMs intact Lungs no rales or rhonchi Heart regular rate and rhythm Abd soft,  nontender, positive bowel sounds MSK no focal spinal tenderness, no upper extremity lymphedema Neuro: nonfocal, well oriented, appropriate affect Breasts: The right breast has undergone lumpectomy followed by radiation.  There is no evidence of local recurrence.  Left breast is unremarkable.  Both axillae are benign.  No cervical or supraclavicular adenopathy Skin: She has lesions mostly over the upper right arm and upper back which are very consistent with insect bites, along the linear distribution, none closer than several centimeters from the other.  She also has some bruising from recent accidents while helping her husband carries some wood  LAB RESULTS:  CMP     Component Value Date/Time   NA 139 07/06/2017 0913   NA 141 06/01/2017 1145   K 4.2 07/06/2017 0913   K 4.0 06/01/2017 1145   CL 100 07/06/2017 0913   CO2 31 07/06/2017 0913   CO2 30 (H) 06/01/2017 1145   GLUCOSE 129 (H) 07/06/2017 0913   GLUCOSE 65 (L) 06/01/2017 1145   BUN 16 07/06/2017 0913   BUN 15.8 06/01/2017 1145   CREATININE 0.82 07/06/2017 0913   CREATININE 0.8 06/01/2017 1145   CALCIUM 8.6 07/06/2017 0913   CALCIUM 9.1 06/01/2017 1145   PROT 6.8 07/06/2017 0913   PROT 7.3 06/01/2017 1145   ALBUMIN 3.8 07/06/2017 0913   ALBUMIN 3.6 06/01/2017 1145   AST 20 07/06/2017 0913   AST 19 06/01/2017 1145   ALT 16 07/06/2017 0913   ALT 15 06/01/2017 1145   ALKPHOS 63 07/06/2017 0913   ALKPHOS 59 06/01/2017 1145   BILITOT 0.3 07/06/2017 0913   BILITOT 0.28 06/01/2017 1145   GFRNONAA 99.73 12/07/2009 0911   GFRAA 94 05/22/2008 0847    INo results found for: SPEP, UPEP  Lab Results  Component Value Date   WBC 6.6 06/03/2018   NEUTROABS 3.7 06/03/2018   HGB 11.4 (L) 06/03/2018   HCT 36.1 06/03/2018   MCV 90.5 06/03/2018   PLT 259 06/03/2018      Chemistry      Component Value Date/Time   NA 139 07/06/2017 0913   NA 141 06/01/2017 1145   K 4.2 07/06/2017 0913   K 4.0 06/01/2017 1145   CL 100  07/06/2017 0913   CO2 31 07/06/2017 0913   CO2 30 (H) 06/01/2017 1145   BUN 16 07/06/2017 0913   BUN 15.8 06/01/2017 1145   CREATININE 0.82 07/06/2017 0913  CREATININE 0.8 06/01/2017 1145      Component Value Date/Time   CALCIUM 8.6 07/06/2017 0913   CALCIUM 9.1 06/01/2017 1145   ALKPHOS 63 07/06/2017 0913   ALKPHOS 59 06/01/2017 1145   AST 20 07/06/2017 0913   AST 19 06/01/2017 1145   ALT 16 07/06/2017 0913   ALT 15 06/01/2017 1145   BILITOT 0.3 07/06/2017 0913   BILITOT 0.28 06/01/2017 1145       No results found for: LABCA2  No components found for: LABCA125  No results for input(s): INR in the last 168 hours.  Urinalysis No results found for: COLORURINE, APPEARANCEUR, LABSPEC, PHURINE, GLUCOSEU, HGBUR, BILIRUBINUR, KETONESUR, PROTEINUR, UROBILINOGEN, NITRITE, LEUKOCYTESUR  STUDIES:   ASSESSMENT: 72 y.o. [place], [place] woman status post right breast Upper inner quadrant biopsy 02/24/2015 for a clinical T1a N0, stage IA invasive lobular carcinoma, grade 1, estrogen and progesterone receptor strongly positive, HER-2 not amplified, with an MIB-1 of 3%  (1)  Right lumpectomy and sentinel lymph node sampling 03/09/2015 showed a pT1b pN0, stage IA  Invasive lobular carcinoma, grade 2 , repeat HER-2 again negative  (2) adjuvant radiation 04/07/2015 through 05/13/2015: Right breast 4860 cGy in 27 sessions, no boost    (3)  started tamoxifen in January 2017  (a)  Status post hysterectomy without salpingo-oophorectomy remotely  PLAN: Irelynn is now a little over 3 years out from definitive surgery for her breast cancer with no evidence of disease recurrence.  This is very favorable.  She is tolerating tamoxifen moderately well.  She does have significant problems with hot flashes.  I think it might be possible for her to increase the gabapentin to 600 at bedtime and that might help her sleep better.  So long as it does not make her sleepy in the morning, we  will give that a try.  Otherwise she will see me again in 1 year.  She will have her mammography and lab work prior to that visit.  She knows to call for any other issues that may develop before then.    Betina Puckett, Valentino Hue, MD  06/03/18 9:55 AM Medical Oncology and Hematology [facility] [address] [place], [place] [address] Tel. [phone]    Fax. [phone]    Gearldine Shown, am acting as scribe for Dr. Valentino Hue. Harmony Sandell.  I, Ruthann Cancer MD, have reviewed the above documentation for accuracy and completeness, and I agree with the above.

## 2018-06-03 ENCOUNTER — Inpatient Hospital Stay: Payer: Medicare Other | Attending: Oncology | Admitting: Oncology

## 2018-06-03 ENCOUNTER — Inpatient Hospital Stay: Payer: Medicare Other

## 2018-06-03 ENCOUNTER — Telehealth: Payer: Self-pay | Admitting: Oncology

## 2018-06-03 VITALS — BP 170/76 | HR 81 | Temp 97.9°F | Resp 18 | Ht 63.5 in | Wt 201.9 lb

## 2018-06-03 DIAGNOSIS — Z170 Estrogen receptor positive status [ER+]: Secondary | ICD-10-CM | POA: Insufficient documentation

## 2018-06-03 DIAGNOSIS — Z79899 Other long term (current) drug therapy: Secondary | ICD-10-CM | POA: Insufficient documentation

## 2018-06-03 DIAGNOSIS — Z9011 Acquired absence of right breast and nipple: Secondary | ICD-10-CM

## 2018-06-03 DIAGNOSIS — C50211 Malignant neoplasm of upper-inner quadrant of right female breast: Secondary | ICD-10-CM

## 2018-06-03 DIAGNOSIS — Z803 Family history of malignant neoplasm of breast: Secondary | ICD-10-CM | POA: Diagnosis not present

## 2018-06-03 DIAGNOSIS — Z9884 Bariatric surgery status: Secondary | ICD-10-CM | POA: Diagnosis not present

## 2018-06-03 DIAGNOSIS — I10 Essential (primary) hypertension: Secondary | ICD-10-CM | POA: Insufficient documentation

## 2018-06-03 DIAGNOSIS — Z90710 Acquired absence of both cervix and uterus: Secondary | ICD-10-CM | POA: Diagnosis not present

## 2018-06-03 DIAGNOSIS — Z923 Personal history of irradiation: Secondary | ICD-10-CM | POA: Diagnosis not present

## 2018-06-03 DIAGNOSIS — Z79810 Long term (current) use of selective estrogen receptor modulators (SERMs): Secondary | ICD-10-CM | POA: Insufficient documentation

## 2018-06-03 LAB — CBC WITH DIFFERENTIAL (CANCER CENTER ONLY)
Abs Immature Granulocytes: 0.02 10*3/uL (ref 0.00–0.07)
BASOS ABS: 0.1 10*3/uL (ref 0.0–0.1)
Basophils Relative: 1 %
EOS ABS: 0.3 10*3/uL (ref 0.0–0.5)
EOS PCT: 5 %
HCT: 36.1 % (ref 36.0–46.0)
HEMOGLOBIN: 11.4 g/dL — AB (ref 12.0–15.0)
Immature Granulocytes: 0 %
LYMPHS ABS: 1.9 10*3/uL (ref 0.7–4.0)
LYMPHS PCT: 29 %
MCH: 28.6 pg (ref 26.0–34.0)
MCHC: 31.6 g/dL (ref 30.0–36.0)
MCV: 90.5 fL (ref 80.0–100.0)
MONO ABS: 0.6 10*3/uL (ref 0.1–1.0)
MONOS PCT: 9 %
Neutro Abs: 3.7 10*3/uL (ref 1.7–7.7)
Neutrophils Relative %: 56 %
Platelet Count: 259 10*3/uL (ref 150–400)
RBC: 3.99 MIL/uL (ref 3.87–5.11)
RDW: 14.4 % (ref 11.5–15.5)
WBC Count: 6.6 10*3/uL (ref 4.0–10.5)
nRBC: 0 % (ref 0.0–0.2)

## 2018-06-03 LAB — CMP (CANCER CENTER ONLY)
ALBUMIN: 3.5 g/dL (ref 3.5–5.0)
ALT: 13 U/L (ref 0–44)
ANION GAP: 9 (ref 5–15)
AST: 17 U/L (ref 15–41)
Alkaline Phosphatase: 68 U/L (ref 38–126)
BUN: 16 mg/dL (ref 8–23)
CALCIUM: 8.9 mg/dL (ref 8.9–10.3)
CO2: 30 mmol/L (ref 22–32)
CREATININE: 0.87 mg/dL (ref 0.44–1.00)
Chloride: 103 mmol/L (ref 98–111)
GFR, Est AFR Am: >60 mL/min (ref >60)
GFR, Estimated: >60 mL/min (ref >60)
GLUCOSE: 112 mg/dL — AB (ref 70–99)
POTASSIUM: 3.9 mmol/L (ref 3.5–5.1)
SODIUM: 142 mmol/L (ref 135–145)
Total Bilirubin: 0.3 mg/dL (ref 0.3–1.2)
Total Protein: 7 g/dL (ref 6.5–8.1)

## 2018-06-03 MED ORDER — GABAPENTIN 300 MG PO CAPS: 300.0000 mg | ORAL_CAPSULE | Freq: Every day | ORAL | 4 refills | Status: DC

## 2018-06-03 MED ORDER — TAMOXIFEN CITRATE 20 MG PO TABS: 20.0000 mg | ORAL_TABLET | Freq: Every day | ORAL | 4 refills | Status: DC

## 2018-06-03 NOTE — Telephone Encounter (Signed)
Gave pt avs and calendar  °

## 2018-08-07 ENCOUNTER — Other Ambulatory Visit: Payer: Self-pay | Admitting: Family Medicine

## 2018-08-07 ENCOUNTER — Other Ambulatory Visit: Payer: Self-pay | Admitting: Oncology

## 2018-08-08 NOTE — Telephone Encounter (Signed)
Pt hasn't been seen in over a year and no future appts., please advise  

## 2018-08-08 NOTE — Telephone Encounter (Signed)
Please schedule f/u or PE and refill until then 

## 2018-08-09 ENCOUNTER — Telehealth: Payer: Self-pay | Admitting: Family Medicine

## 2018-08-09 NOTE — Telephone Encounter (Signed)
I attempted to call patient to schedule appointment.  Patient didn't answer and her voice mail is full.  If patient, returns my call, please schedule appointment.

## 2018-08-09 NOTE — Telephone Encounter (Signed)
I left a message on patient's voice mail asking her to call back and schedule a follow up appointment.

## 2018-08-09 NOTE — Telephone Encounter (Signed)
meds refilled once and Carrie will reach out to pt to try and get appt scheduled  

## 2018-10-29 ENCOUNTER — Other Ambulatory Visit: Payer: Self-pay | Admitting: Oncology

## 2018-10-29 ENCOUNTER — Other Ambulatory Visit: Payer: Self-pay | Admitting: Family Medicine

## 2018-10-29 NOTE — Telephone Encounter (Signed)
Pt hasn't been seen since 2018, ? If you want to refill med or schedule virtual visit?, please advise

## 2018-10-29 NOTE — Telephone Encounter (Signed)
Please schedule virtual visit for f/u of chronic health problems  Thanks

## 2018-10-30 NOTE — Telephone Encounter (Signed)
Left VM requesting pt to call the office back 

## 2018-10-31 NOTE — Telephone Encounter (Signed)
Left 2nd VM requesting pt to call the office back  

## 2018-11-01 NOTE — Telephone Encounter (Signed)
Pt scheduled appt for Monday will hold Rxs until OV

## 2018-11-04 ENCOUNTER — Other Ambulatory Visit: Payer: Self-pay

## 2018-11-04 ENCOUNTER — Ambulatory Visit (INDEPENDENT_AMBULATORY_CARE_PROVIDER_SITE_OTHER): Payer: Medicare Other | Admitting: Family Medicine

## 2018-11-04 ENCOUNTER — Encounter: Payer: Self-pay | Admitting: Family Medicine

## 2018-11-04 DIAGNOSIS — E7800 Pure hypercholesterolemia, unspecified: Secondary | ICD-10-CM

## 2018-11-04 DIAGNOSIS — E559 Vitamin D deficiency, unspecified: Secondary | ICD-10-CM

## 2018-11-04 DIAGNOSIS — Z6834 Body mass index (BMI) 34.0-34.9, adult: Secondary | ICD-10-CM

## 2018-11-04 DIAGNOSIS — E538 Deficiency of other specified B group vitamins: Secondary | ICD-10-CM

## 2018-11-04 DIAGNOSIS — I10 Essential (primary) hypertension: Secondary | ICD-10-CM

## 2018-11-04 DIAGNOSIS — E6609 Other obesity due to excess calories: Secondary | ICD-10-CM

## 2018-11-04 DIAGNOSIS — C50211 Malignant neoplasm of upper-inner quadrant of right female breast: Secondary | ICD-10-CM

## 2018-11-04 DIAGNOSIS — Z170 Estrogen receptor positive status [ER+]: Secondary | ICD-10-CM

## 2018-11-04 DIAGNOSIS — R7303 Prediabetes: Secondary | ICD-10-CM

## 2018-11-04 NOTE — Progress Notes (Signed)
  Patient was not able to be reached by phone for this visit today and her voice mail box was full so I could not proceed

## 2018-11-15 ENCOUNTER — Telehealth: Payer: Self-pay | Admitting: *Deleted

## 2018-11-15 NOTE — Telephone Encounter (Signed)
Called pt and no answer and pt's VM box is full 

## 2018-11-15 NOTE — Telephone Encounter (Signed)
-----   Message from Abner Greenspan, MD sent at 11/15/2018  8:55 AM EDT ----- I tried a visit with her virtually on 4/6 and never got in touch Can you please try to get her to re schedule this?  Thanks

## 2018-11-19 NOTE — Telephone Encounter (Signed)
Called pt and was unable to leave a VM.

## 2019-01-21 ENCOUNTER — Other Ambulatory Visit: Payer: Self-pay | Admitting: Family Medicine

## 2019-04-18 ENCOUNTER — Other Ambulatory Visit: Payer: Self-pay | Admitting: Oncology

## 2019-04-18 DIAGNOSIS — Z9889 Other specified postprocedural states: Secondary | ICD-10-CM

## 2019-04-22 ENCOUNTER — Other Ambulatory Visit: Payer: Self-pay | Admitting: Oncology

## 2019-04-22 DIAGNOSIS — Z853 Personal history of malignant neoplasm of breast: Secondary | ICD-10-CM

## 2019-04-25 ENCOUNTER — Ambulatory Visit
Admission: RE | Admit: 2019-04-25 | Discharge: 2019-04-25 | Disposition: A | Discharge status: Home / Self Care | Payer: Medicare Other | Source: Ambulatory Visit | Attending: Oncology | Admitting: Oncology

## 2019-04-25 ENCOUNTER — Other Ambulatory Visit: Payer: Self-pay

## 2019-04-25 DIAGNOSIS — R922 Inconclusive mammogram: Secondary | ICD-10-CM | POA: Diagnosis not present

## 2019-04-25 DIAGNOSIS — Z853 Personal history of malignant neoplasm of breast: Secondary | ICD-10-CM

## 2019-06-07 NOTE — Progress Notes (Signed)
[facility]  [phone] [phone]     ID: Jennifer Bartlett DOB: 1945/11/28  MR#: [id number]  HYQ#:[id number]  Patient Care Team: Judy Pimple, MD as PCP - General [place] Davonna Belling, NP as Nurse Practitioner (Obstetrics and Gynecology) Almond Lint, MD as Consulting Physician (General Surgery) Aniylah Avans, Valentino Hue, MD as Consulting Physician (Oncology) Pershing Proud, RN as Registered Nurse Donnelly Angelica, RN as Registered Nurse Damita Lack, Marcy Salvo, NP as Nurse Practitioner (Nurse Practitioner) Dannielle Huh, MD as Consulting Physician (Orthopedic Surgery) OTHER MD:  CHIEF COMPLAINT: Estrogen receptor positive breast cancer  CURRENT TREATMENT:  tamoxifen   INTERVAL HISTORY: Jaquelina returns today for follow-up of her estrogen receptor positive breast cancer.   She continues on tamoxifen, generally with good tolerance.  She does have hot flashes and she is helped by gabapentin at bedtime.  Currently she is taking 600 mg daily.  She is not having problems with vaginal wetness or discharge  Since her last visit, she underwent bilateral diagnostic mammography with [facility] on [facility]25[facility]2019 showing: breast density category C; no evidence of malignancy in either breast.   REVIEW OF SYSTEMS: Palin change under her left breast she noted this before she had her mammogram.  By the time she had a mammogram, few days later, the little bump disappeared.  She does not know what caused it and she does not know what caused it to go away.  Her husband also palpated it.  Of course was not shown on her recent mammogram which was entirely benign.  For exercise she walks up and down her driveway which is 1800 feet and she sometimes does it twice daily.  They are taking appropriate pandemic precautions.  A detailed review of systems today was otherwise stable.   BREAST CANCER HISTORY:  From the original intake note:  Molene underwent  bilateral screening [facility]. This showed a possible mass in the right breast. She was recalled for diagnostic right mammography with tomosynthesis and right breast ultrasonography 02/17/2015. This found the breast density to be category C. In the right breast upper inner quadrant there was a persistent poorly marginated nodule which was not palpable. By ultrasound this measured 0.5 cm. There was no evidence of right axillary lymphadenopathy.  Biopsy of this mass 02/24/2015 showed ([id number]) an invasive lobular breast cancer, E-cadherin negative, grade 1, estrogen receptor 95% positive, progesterone receptor 60% positive, both with strong staining intensity, with an MIB-1 of 3%, and no HER-2 amplification, the signals ratio being 1.29 and the number per cell 2.00.  Her subsequent history is as detailed below   PAST MEDICAL HISTORY: Past Medical History:  Diagnosis Date  . Anemia   . Anxiety   . Breast cancer (HCC)   . 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  . GERD (gastroesophageal reflux disease)   . Hot flashes   . Hyperlipidemia   . Hypertension   . Hypoglycemia   . Obesity   . Personal history of radiation therapy   . Vitamin B 12 deficiency     PAST SURGICAL HISTORY: Past Surgical History:  Procedure Laterality Date  . ABDOMINAL HYSTERECTOMY    . BREAST BIOPSY    . BREAST LUMPECTOMY Right    2016  . GASTRIC BYPASS  08/2003  . HERNIA REPAIR    . KNEE SURGERY     left  . PARTIAL HYSTERECTOMY  1990's  bleeding  . RADIOACTIVE SEED GUIDED PARTIAL MASTECTOMY WITH AXILLARY SENTINEL LYMPH NODE BIOPSY Right 03/09/2015   Procedure: RADIOACTIVE SEED GUIDED RIGHT PARTIAL MASTECTOMY WITH AXILLARY SENTINEL LYMPH NODE BIOPSY;  Surgeon: Almond Lint, MD;  Location:  [facility];  Service: General;  Laterality: Right;  . TUBAL LIGATION      FAMILY HISTORY Family History  Problem  Relation Age of Onset  . Heart disease Mother        MI  . Hypertension Mother   . Alcohol abuse Father   . Heart disease Father        smoker  . Hypertension Father   . Breast cancer Paternal Grandmother   . Diabetes Maternal Aunt    the patient's father died at the age of 60 and her mother at the age of 64, both from heart attacks in the setting of tobacco abuse. The patient has 3 brothers, one sister. The patient's paternal grandmother may have been diagnosed with breast cancer in her late 56s, but that is not certain. There is no other history of breast or ovarian cancer in the family to the patient's knowledge.   GYNECOLOGIC HISTORY:  Patient's last menstrual period was 06/08/1989. Menarche age 53, first live birth age 52; the patient is GX P2. She underwent hysterectomy without salpingo-oophorectomy more than 20 years ago. She has been on hormone replacement more than 20 years. She understands this does increase the risk of developing breast cancer. This was stopped July 2016.   SOCIAL HISTORY: (Updated 06/01/17) Shakala used to work for [employer] but is now retired. Her husband Shawnea Virga (goes by [name]) worked 49 years for [employer]. The patient's daughter Leonel Ramsay lives in [place] and is Interior and spatial designer of the afterschool program in [place]. The patient's son [name] lives in [place] and works for [employer], a company that cleans up after disaster or certain crimes. The patient also has an adopted daughter with knee Clinton Sawyer who lives in [place] and is a homemaker. The patient has 4 blood grandchildren and 2 adopted grandchildren. At home its the patient and her husband, her daughter and her husband and 2 grandchildren. Orangie attends a [employer]    ADVANCED DIRECTIVES: Not in place   HEALTH MAINTENANCE: Social History   Tobacco Use  . Smoking status: Never Smoker  . Smokeless tobacco: Never Used  Substance Use Topics  . Alcohol  use: No  . Drug use: No     Colonoscopy: Never (status post bypass surgery)  PAP: 2015  Bone density: c. 2012; normal  Lipid panel:  Allergies  Allergen Reactions  . Crestor [Rosuvastatin Calcium] Other (See Comments)    Myalgia   . Lipitor [Atorvastatin Calcium] Other (See Comments)    Aches and pains  . Penicillins Shortness Of Breath and Swelling  . Zocor [Simvastatin - High Dose] Other (See Comments)    Muscle/joint aches    Current Outpatient Medications  Medication Sig Dispense Refill  . BIOTIN PO Take 1 tablet by mouth daily.    Marland Kitchen CALCIUM PO Take by mouth daily.      . cholecalciferol (VITAMIN D) 400 UNITS TABS Take 400 Units by mouth daily.     Marland Kitchen gabapentin (NEURONTIN) 300 MG capsule Take 1-2 capsules (300-600 mg total) by mouth at bedtime. 90 capsule 4  . hydrochlorothiazide (HYDRODIURIL) 25 MG tablet Take 1 tablet by mouth once daily 90 tablet 1  . loratadine (CLARITIN) 10 MG tablet Take 10 mg by mouth daily.    Marland Kitchen  Multiple Vitamin (MULTIVITAMIN) capsule Take 1 capsule by mouth daily.      Marland Kitchen omeprazole (PRILOSEC) 20 MG capsule Take 1 capsule by mouth once daily 90 capsule 1  . tamoxifen (NOLVADEX) 20 MG tablet Take 1 tablet (20 mg total) by mouth daily. 90 tablet 4  . TURMERIC PO Take 1 capsule by mouth 3 (three) times daily.    Marland Kitchen venlafaxine XR (EFFEXOR-XR) 150 MG 24 hr capsule TAKE 1 CAPSULE BY MOUTH ONCE DAILY WITH BREAKFAST 90 capsule 2   No current facility-administered medications for this visit.     OBJECTIVE: Middle-aged white woman who appears stated age  Vitals:   06/09/19 0903  BP: (!) 142/75  Pulse: 76  Resp: 18  Temp: 98.2 F (36.8 C)  SpO2: 100%     Body mass index is 37.23 kg/m.    ECOG FS:1 - Symptomatic but completely ambulatory  Sclerae unicteric, EOMs intact Wearing a mask No cervical or supraclavicular adenopathy Lungs no rales or rhonchi Heart regular rate and rhythm Abd soft, nontender, positive bowel sounds MSK no focal spinal  tenderness, no upper extremity lymphedema Neuro: nonfocal, well oriented, appropriate affect Breasts: The right breast is status post lumpectomy and radiation with no evidence of disease recurrence.  The left breast is benign.  The area under the left breast is entirely unremarkable.  Both axillae are benign.   LAB RESULTS:  CMP     Component Value Date/Time   NA 142 06/09/2019 0839   NA 141 06/01/2017 1145   K 3.3 (L) 06/09/2019 0839   K 4.0 06/01/2017 1145   CL 103 06/09/2019 0839   CO2 28 06/09/2019 0839   CO2 30 (H) 06/01/2017 1145   GLUCOSE 84 06/09/2019 0839   GLUCOSE 65 (L) 06/01/2017 1145   BUN 18 06/09/2019 0839   BUN 15.8 06/01/2017 1145   CREATININE 1.00 06/09/2019 0839   CREATININE 0.87 06/03/2018 0923   CREATININE 0.8 06/01/2017 1145   CALCIUM 8.3 (L) 06/09/2019 0839   CALCIUM 9.1 06/01/2017 1145   PROT 7.0 06/09/2019 0839   PROT 7.3 06/01/2017 1145   ALBUMIN 3.5 06/09/2019 0839   ALBUMIN 3.6 06/01/2017 1145   AST 15 06/09/2019 0839   AST 17 06/03/2018 0923   AST 19 06/01/2017 1145   ALT 14 06/09/2019 0839   ALT 13 06/03/2018 0923   ALT 15 06/01/2017 1145   ALKPHOS 77 06/09/2019 0839   ALKPHOS 59 06/01/2017 1145   BILITOT <0.2 (L) 06/09/2019 0839   BILITOT 0.3 06/03/2018 0923   BILITOT 0.28 06/01/2017 1145   GFRNONAA 56 (L) 06/09/2019 0839   GFRNONAA >60 06/03/2018 0923   GFRAA >60 06/09/2019 0839   GFRAA >60 06/03/2018 0923    INo results found for: SPEP, UPEP  Lab Results  Component Value Date   WBC 6.6 06/09/2019   NEUTROABS 3.2 06/09/2019   HGB 11.4 (L) 06/09/2019   HCT 36.1 06/09/2019   MCV 90.5 06/09/2019   PLT 266 06/09/2019      Chemistry      Component Value Date/Time   NA 142 06/09/2019 0839   NA 141 06/01/2017 1145   K 3.3 (L) 06/09/2019 0839   K 4.0 06/01/2017 1145   CL 103 06/09/2019 0839   CO2 28 06/09/2019 0839   CO2 30 (H) 06/01/2017 1145   BUN 18 06/09/2019 0839   BUN 15.8 06/01/2017 1145   CREATININE 1.00 06/09/2019  0839   CREATININE 0.87 06/03/2018 0923   CREATININE 0.8 06/01/2017 1145  Component Value Date/Time   CALCIUM 8.3 (L) 06/09/2019 0839   CALCIUM 9.1 06/01/2017 1145   ALKPHOS 77 06/09/2019 0839   ALKPHOS 59 06/01/2017 1145   AST 15 06/09/2019 0839   AST 17 06/03/2018 0923   AST 19 06/01/2017 1145   ALT 14 06/09/2019 0839   ALT 13 06/03/2018 0923   ALT 15 06/01/2017 1145   BILITOT <0.2 (L) 06/09/2019 0839   BILITOT 0.3 06/03/2018 0923   BILITOT 0.28 06/01/2017 1145       No results found for: LABCA2  No components found for: [id number]  No results for input(s): INR in the last 168 hours.  Urinalysis No results found for: COLORURINE, APPEARANCEUR, LABSPEC, PHURINE, GLUCOSEU, HGBUR, BILIRUBINUR, KETONESUR, PROTEINUR, UROBILINOGEN, NITRITE, LEUKOCYTESUR  STUDIES: No results found.   ASSESSMENT: 73 y.o. [place], [place] woman status post right breast Upper inner quadrant biopsy 02/24/2015 for a clinical T1a N0, stage IA invasive lobular carcinoma, grade 1, estrogen and progesterone receptor strongly positive, HER-2 not amplified, with an MIB-1 of 3%  (1)  Right lumpectomy and sentinel lymph node sampling 03/09/2015 showed a pT1b pN0, stage IA  Invasive lobular carcinoma, grade 2 , repeat HER-2 again negative  (2) adjuvant radiation 04/07/2015 through 05/13/2015: Right breast 4860 cGy in 27 sessions, no boost    (3)  started tamoxifen in January 2017  (a)  Status post hysterectomy without salpingo-oophorectomy remotely  PLAN: Anelisa is now a little over 4 years out from definitive surgery for her breast cancer with disease recurrence.  This is very favorable.  She is tolerating tamoxifen well and the plan is to continue that a total of 5 years.  When she sees me again a year from now she will be ready to "graduate".  I have encouraged her to continue her exercise program and increase it if possible.  He knows to call for any other issue that may  develop before her next visit here.   Olman Yono, Valentino Hue, MD  06/09/19 9:37 AM Medical Oncology and Hematology [facility] [address] [place], [place] [address] Tel. [phone]    Fax. [phone]    I, Mickie Bail, am acting as scribe for Dr. Valentino Hue. Caleel Kiner.  I, Ruthann Cancer MD, have reviewed the above documentation for accuracy and completeness, and I agree with the above.

## 2019-06-09 ENCOUNTER — Other Ambulatory Visit: Payer: Self-pay

## 2019-06-09 ENCOUNTER — Inpatient Hospital Stay: Payer: Medicare Other | Admitting: Oncology

## 2019-06-09 ENCOUNTER — Inpatient Hospital Stay: Payer: Medicare Other | Attending: Oncology

## 2019-06-09 VITALS — BP 142/75 | HR 76 | Temp 98.2°F | Resp 18 | Ht 63.5 in | Wt 213.5 lb

## 2019-06-09 DIAGNOSIS — C50211 Malignant neoplasm of upper-inner quadrant of right female breast: Secondary | ICD-10-CM | POA: Insufficient documentation

## 2019-06-09 DIAGNOSIS — Z79810 Long term (current) use of selective estrogen receptor modulators (SERMs): Secondary | ICD-10-CM | POA: Insufficient documentation

## 2019-06-09 DIAGNOSIS — Z170 Estrogen receptor positive status [ER+]: Secondary | ICD-10-CM | POA: Diagnosis not present

## 2019-06-09 DIAGNOSIS — N951 Menopausal and female climacteric states: Secondary | ICD-10-CM | POA: Diagnosis not present

## 2019-06-09 LAB — COMPREHENSIVE METABOLIC PANEL
ALT: 14 U/L (ref 0–44)
AST: 15 U/L (ref 15–41)
Albumin: 3.5 g/dL (ref 3.5–5.0)
Alkaline Phosphatase: 77 U/L (ref 38–126)
Anion gap: 11 (ref 5–15)
BUN: 18 mg/dL (ref 8–23)
CO2: 28 mmol/L (ref 22–32)
Calcium: 8.3 mg/dL — ABNORMAL LOW (ref 8.9–10.3)
Chloride: 103 mmol/L (ref 98–111)
Creatinine, Ser: 1 mg/dL (ref 0.44–1.00)
GFR calc Af Amer: >60 mL/min (ref >60)
GFR calc non Af Amer: 56 mL/min — ABNORMAL LOW (ref >60)
Glucose, Bld: 84 mg/dL (ref 70–99)
Potassium: 3.3 mmol/L — ABNORMAL LOW (ref 3.5–5.1)
Sodium: 142 mmol/L (ref 135–145)
Total Bilirubin: <0.2 mg/dL — ABNORMAL LOW (ref 0.3–1.2)
Total Protein: 7 g/dL (ref 6.5–8.1)

## 2019-06-09 LAB — CBC WITH DIFFERENTIAL/PLATELET
Abs Immature Granulocytes: 0.01 10*3/uL (ref 0.00–0.07)
Basophils Absolute: 0.1 10*3/uL (ref 0.0–0.1)
Basophils Relative: 1 %
Eosinophils Absolute: 0.3 10*3/uL (ref 0.0–0.5)
Eosinophils Relative: 4 %
HCT: 36.1 % (ref 36.0–46.0)
Hemoglobin: 11.4 g/dL — ABNORMAL LOW (ref 12.0–15.0)
Immature Granulocytes: 0 %
Lymphocytes Relative: 39 %
Lymphs Abs: 2.6 10*3/uL (ref 0.7–4.0)
MCH: 28.6 pg (ref 26.0–34.0)
MCHC: 31.6 g/dL (ref 30.0–36.0)
MCV: 90.5 fL (ref 80.0–100.0)
Monocytes Absolute: 0.5 10*3/uL (ref 0.1–1.0)
Monocytes Relative: 7 %
Neutro Abs: 3.2 10*3/uL (ref 1.7–7.7)
Neutrophils Relative %: 49 %
Platelets: 266 10*3/uL (ref 150–400)
RBC: 3.99 MIL/uL (ref 3.87–5.11)
RDW: 15.1 % (ref 11.5–15.5)
WBC: 6.6 10*3/uL (ref 4.0–10.5)
nRBC: 0 % (ref 0.0–0.2)

## 2019-06-09 MED ORDER — GABAPENTIN 300 MG PO CAPS: 600.0000 mg | ORAL_CAPSULE | Freq: Every day | ORAL | 4 refills | Status: DC

## 2019-06-09 MED ORDER — VENLAFAXINE HCL ER 150 MG PO CP24: 150.0000 mg | ORAL_CAPSULE | Freq: Every day | ORAL | 4 refills | Status: DC

## 2019-06-09 MED ORDER — TAMOXIFEN CITRATE 20 MG PO TABS: 20.0000 mg | ORAL_TABLET | Freq: Every day | ORAL | 4 refills | Status: DC

## 2019-06-10 ENCOUNTER — Telehealth: Payer: Self-pay | Admitting: Oncology

## 2019-06-10 NOTE — Telephone Encounter (Signed)
Left a message regarding schedule °

## 2019-06-12 DIAGNOSIS — H524 Presbyopia: Secondary | ICD-10-CM | POA: Diagnosis not present

## 2019-09-02 ENCOUNTER — Other Ambulatory Visit: Payer: Self-pay | Admitting: Family Medicine

## 2019-11-19 ENCOUNTER — Other Ambulatory Visit: Payer: Self-pay | Admitting: Family Medicine

## 2019-11-19 NOTE — Telephone Encounter (Signed)
Med refilled one and Morey Hummingbird will reach out to pt to get appt scheduled

## 2019-11-19 NOTE — Telephone Encounter (Signed)
Please schedule a f/u and refill until then 

## 2019-11-19 NOTE — Telephone Encounter (Signed)
Pt hasn't been seen in over a year and no future appts., please advise  

## 2019-11-28 ENCOUNTER — Ambulatory Visit: Payer: Medicare Other | Admitting: Family Medicine

## 2019-12-05 ENCOUNTER — Ambulatory Visit (INDEPENDENT_AMBULATORY_CARE_PROVIDER_SITE_OTHER): Payer: Medicare Other | Admitting: Family Medicine

## 2019-12-05 ENCOUNTER — Other Ambulatory Visit: Payer: Self-pay

## 2019-12-05 ENCOUNTER — Encounter: Payer: Self-pay | Admitting: Family Medicine

## 2019-12-05 VITALS — BP 140/66 | HR 73 | Temp 96.9°F | Ht 63.5 in | Wt 216.5 lb

## 2019-12-05 DIAGNOSIS — E7800 Pure hypercholesterolemia, unspecified: Secondary | ICD-10-CM

## 2019-12-05 DIAGNOSIS — E559 Vitamin D deficiency, unspecified: Secondary | ICD-10-CM | POA: Diagnosis not present

## 2019-12-05 DIAGNOSIS — E538 Deficiency of other specified B group vitamins: Secondary | ICD-10-CM

## 2019-12-05 DIAGNOSIS — K219 Gastro-esophageal reflux disease without esophagitis: Secondary | ICD-10-CM

## 2019-12-05 DIAGNOSIS — R7303 Prediabetes: Secondary | ICD-10-CM | POA: Diagnosis not present

## 2019-12-05 DIAGNOSIS — I10 Essential (primary) hypertension: Secondary | ICD-10-CM | POA: Diagnosis not present

## 2019-12-05 DIAGNOSIS — Z853 Personal history of malignant neoplasm of breast: Secondary | ICD-10-CM

## 2019-12-05 DIAGNOSIS — Z6837 Body mass index (BMI) 37.0-37.9, adult: Secondary | ICD-10-CM

## 2019-12-05 DIAGNOSIS — Z1211 Encounter for screening for malignant neoplasm of colon: Secondary | ICD-10-CM

## 2019-12-05 LAB — CBC WITH DIFFERENTIAL/PLATELET
Basophils Absolute: 0.1 10*3/uL (ref 0.0–0.1)
Basophils Relative: 0.8 % (ref 0.0–3.0)
Eosinophils Absolute: 0.3 10*3/uL (ref 0.0–0.7)
Eosinophils Relative: 4.2 % (ref 0.0–5.0)
HCT: 34 % — ABNORMAL LOW (ref 36.0–46.0)
Hemoglobin: 11.1 g/dL — ABNORMAL LOW (ref 12.0–15.0)
Lymphocytes Relative: 28.8 % (ref 12.0–46.0)
Lymphs Abs: 1.9 10*3/uL (ref 0.7–4.0)
MCHC: 32.5 g/dL (ref 30.0–36.0)
MCV: 86.5 fl (ref 78.0–100.0)
Monocytes Absolute: 0.5 10*3/uL (ref 0.1–1.0)
Monocytes Relative: 7.8 % (ref 3.0–12.0)
Neutro Abs: 3.9 10*3/uL (ref 1.4–7.7)
Neutrophils Relative %: 58.4 % (ref 43.0–77.0)
Platelets: 282 10*3/uL (ref 150.0–400.0)
RBC: 3.93 Mil/uL (ref 3.87–5.11)
RDW: 16 % — ABNORMAL HIGH (ref 11.5–15.5)
WBC: 6.7 10*3/uL (ref 4.0–10.5)

## 2019-12-05 LAB — LIPID PANEL
Cholesterol: 227 mg/dL — ABNORMAL HIGH (ref 0–200)
HDL: 50.1 mg/dL (ref >39.00)
LDL Cholesterol: 151 mg/dL — ABNORMAL HIGH (ref 0–99)
NonHDL: 176.62
Total CHOL/HDL Ratio: 5
Triglycerides: 129 mg/dL (ref 0.0–149.0)
VLDL: 25.8 mg/dL (ref 0.0–40.0)

## 2019-12-05 LAB — COMPREHENSIVE METABOLIC PANEL
ALT: 10 U/L (ref 0–35)
AST: 14 U/L (ref 0–37)
Albumin: 3.8 g/dL (ref 3.5–5.2)
Alkaline Phosphatase: 72 U/L (ref 39–117)
BUN: 22 mg/dL (ref 6–23)
CO2: 31 mEq/L (ref 19–32)
Calcium: 8.6 mg/dL (ref 8.4–10.5)
Chloride: 102 mEq/L (ref 96–112)
Creatinine, Ser: 0.92 mg/dL (ref 0.40–1.20)
GFR: 59.9 mL/min — ABNORMAL LOW (ref >60.00)
Glucose, Bld: 116 mg/dL — ABNORMAL HIGH (ref 70–99)
Potassium: 4 mEq/L (ref 3.5–5.1)
Sodium: 137 mEq/L (ref 135–145)
Total Bilirubin: 0.4 mg/dL (ref 0.2–1.2)
Total Protein: 6.8 g/dL (ref 6.0–8.3)

## 2019-12-05 LAB — VITAMIN D 25 HYDROXY (VIT D DEFICIENCY, FRACTURES): VITD: 20.58 ng/mL — ABNORMAL LOW (ref 30.00–100.00)

## 2019-12-05 LAB — VITAMIN B12: Vitamin B-12: 302 pg/mL (ref 211–911)

## 2019-12-05 LAB — HEMOGLOBIN A1C: Hgb A1c MFr Bld: 6.2 % (ref 4.6–6.5)

## 2019-12-05 LAB — TSH: TSH: 6.16 u[IU]/mL — ABNORMAL HIGH (ref 0.35–4.50)

## 2019-12-05 NOTE — Progress Notes (Signed)
Subjective:    Patient ID: Leticia Clas, female    DOB: November 12, 1945, 74 y.o.   MRN: [id number]  This visit occurred during the SARS-CoV-2 public health emergency.  Safety protocols were in place, including screening questions prior to the visit, additional usage of staff PPE, and extensive cleaning of exam room while observing appropriate contact time as indicated for disinfecting solutions.    HPI Pt presents for f/u of chronic health problems   Wt Readings from Last 3 Encounters:  12/05/19 216 lb 8 oz (98.2 kg)  06/09/19 213 lb 8 oz (96.8 kg)  06/03/18 201 lb 14.4 oz (91.6 kg)   37.75 kg/m   Had her covid vaccination -happy about that  Has been doing well  Very busy -esp with grandchildren   Taking care of herself  [place] for exercise- driveway and garden  Also uses a sit down pedaler   bp is stable today  No cp or palpitations or headaches or edema  No side effects to medicines  BP Readings from Last 3 Encounters:  12/05/19 140/66  06/09/19 (!) 142/75  06/03/18 (!) 170/76   hctz 25 mg  No issues  2nd check BP: 140/66    Lab Results  Component Value Date   CREATININE 1.00 06/09/2019   BUN 18 06/09/2019   NA 142 06/09/2019   K 3.3 (L) 06/09/2019   CL 103 06/09/2019   CO2 28 06/09/2019   Hyperlipidemia Lab Results  Component Value Date   CHOL 176 07/06/2017   HDL 46.90 07/06/2017   LDLCALC 110 (H) 07/06/2017   LDLDIRECT 139.2 08/25/2013   TRIG 96.0 07/06/2017   CHOLHDL 4 07/06/2017     prediabetes Lab Results  Component Value Date   HGBA1C 6.0 07/06/2017   B12 deficiency Lab Results  Component Value Date   VITAMINB12 433 07/06/2017   In context of bariatric surgery  Oral supplementation  Still taking it   Also taking vitamin D   Past h/o breast cancer -4 y since surgery  Taking tamoxifen (5 y planned)  Gabapentin at bedtime for hot flashes along with effexor xr  Mammogram was 9/20   GERd- still takes omeprazole -works well   Diet  has been fair  Does not care for meat a lot anymore (some chicken and fish)  For protein- dairy (yogurt/oatmeal/cheese/beans and lentils)   Interested in cologuard   Patient Active Problem List   Diagnosis Date Noted  . History of breast cancer 12/05/2019  . Prediabetes 07/04/2016  . Routine general medical examination at a health care facility 07/04/2016  . Encounter for hepatitis C screening test for low risk patient 06/29/2016  . Estrogen deficiency 01/11/2016  . Malignant neoplasm of upper-inner quadrant of right breast in female, estrogen receptor positive ([place]) 02/26/2015  . Colon cancer screening 09/06/2012  . Vitamin D deficiency 12/28/2009  . STRESS REACTION, ACUTE, WITH EMOTIONAL DISTURBANCE 12/07/2009  . HYPOGLYCEMIA NOS 05/28/2007  . Vitamin B12 deficiency 03/07/2007  . HYPERCHOLESTEROLEMIA 03/07/2007  . Class 2 severe obesity due to excess calories with serious comorbidity and body mass index (BMI) of 37.0 to 37.9 in adult ([place]) 03/07/2007  . Essential hypertension 03/07/2007  . GERD 03/07/2007   Past Medical History:  Diagnosis Date  . Anemia   . Anxiety   . Breast cancer ([place])   . Breast cancer of upper-inner quadrant of right female breast ([place]) 02/26/2015  . GERD (gastroesophageal reflux disease)   . Hot flashes   . Hyperlipidemia   .  Hypertension   . Hypoglycemia   . Obesity   . Personal history of radiation therapy   . Vitamin B 12 deficiency    Past Surgical History:  Procedure Laterality Date  . ABDOMINAL HYSTERECTOMY    . BREAST BIOPSY    . BREAST LUMPECTOMY Right    2016  . GASTRIC BYPASS  08/2003  . HERNIA REPAIR    . KNEE SURGERY     left  . PARTIAL HYSTERECTOMY  1990's   bleeding  . RADIOACTIVE SEED GUIDED PARTIAL MASTECTOMY WITH AXILLARY SENTINEL LYMPH NODE BIOPSY Right 03/09/2015   Procedure: RADIOACTIVE SEED GUIDED RIGHT PARTIAL MASTECTOMY WITH AXILLARY SENTINEL LYMPH NODE BIOPSY;  Surgeon: Stark Klein, MD;  Location: [facility];  Service: General;  Laterality: Right;  . TUBAL LIGATION     Social History   Tobacco Use  . Smoking status: Never Smoker  . Smokeless tobacco: Never Used  Substance Use Topics  . Alcohol use: No  . Drug use: No   Family History  Problem Relation Age of Onset  . Heart disease Mother        MI  . Hypertension Mother   . Alcohol abuse Father   . Heart disease Father        smoker  . Hypertension Father   . Breast cancer Paternal Grandmother   . Diabetes Maternal Aunt    Allergies  Allergen Reactions  . Crestor [Rosuvastatin Calcium] Other (See Comments)    Myalgia   . Lipitor [Atorvastatin Calcium] Other (See Comments)    Aches and pains  . Penicillins Shortness Of Breath and Swelling  . Zocor [Simvastatin - High Dose] Other (See Comments)    Muscle/joint aches   Current Outpatient Medications on File Prior to Visit  Medication Sig Dispense Refill  . BIOTIN PO Take 1 tablet by mouth daily.    Marland Kitchen CALCIUM PO Take by mouth daily.      . cholecalciferol (VITAMIN D) 400 UNITS TABS Take 400 Units by mouth daily.     Marland Kitchen gabapentin (NEURONTIN) 300 MG capsule Take 2 capsules (600 mg total) by mouth at bedtime. 180 capsule 4  . hydrochlorothiazide (HYDRODIURIL) 25 MG tablet Take 1 tablet by mouth once daily 90 tablet 0  . loratadine (CLARITIN) 10 MG tablet Take 10 mg by mouth daily.    . Multiple Vitamin (MULTIVITAMIN) capsule Take 1 capsule by mouth daily.      Marland Kitchen omeprazole (PRILOSEC) 20 MG capsule Take 1 capsule by mouth once daily 90 capsule 0  . tamoxifen (NOLVADEX) 20 MG tablet Take 1 tablet (20 mg total) by mouth daily. 90 tablet 4  . venlafaxine XR (EFFEXOR-XR) 150 MG 24 hr capsule Take 1 capsule (150 mg total) by mouth daily with breakfast. 90 capsule 4   No current facility-administered medications on file prior to visit.    Review of Systems  Constitutional: Negative for activity change, appetite change, fatigue, fever and unexpected weight change.  HENT:  Negative for congestion, ear pain, rhinorrhea, sinus pressure and sore throat.   Eyes: Negative for pain, redness and visual disturbance.  Respiratory: Negative for cough, shortness of breath and wheezing.   Cardiovascular: Negative for chest pain and palpitations.  Gastrointestinal: Negative for abdominal pain, blood in stool, constipation and diarrhea.  Endocrine: Negative for polydipsia and polyuria.  Genitourinary: Negative for dysuria, frequency and urgency.  Musculoskeletal: Negative for arthralgias, back pain and myalgias.  Skin: Negative for pallor and rash.  Allergic/Immunologic: Negative for environmental  allergies.  Neurological: Negative for dizziness, syncope and headaches.  Hematological: Negative for adenopathy. Does not bruise/bleed easily.  Psychiatric/Behavioral: Negative for decreased concentration and dysphoric mood. The patient is not nervous/anxious.        Objective:   Physical Exam Constitutional:      General: She is not in acute distress.    Appearance: Normal appearance. She is well-developed. She is obese. She is not ill-appearing or diaphoretic.  HENT:     Head: Normocephalic and atraumatic.  Eyes:     Conjunctiva/sclera: Conjunctivae normal.     Pupils: Pupils are equal, round, and reactive to light.  Neck:     Thyroid: No thyromegaly.     Vascular: No carotid bruit or JVD.  Cardiovascular:     Rate and Rhythm: Normal rate and regular rhythm.     Pulses: Normal pulses.     Heart sounds: Normal heart sounds. No gallop.   Pulmonary:     Effort: Pulmonary effort is normal. No respiratory distress.     Breath sounds: Normal breath sounds. No wheezing or rales.  Abdominal:     General: Bowel sounds are normal. There is no distension or abdominal bruit.     Palpations: Abdomen is soft. There is no mass.     Tenderness: There is no abdominal tenderness.  Musculoskeletal:     Cervical back: Normal range of motion and neck supple.  Lymphadenopathy:      Cervical: No cervical adenopathy.  Skin:    General: Skin is warm and dry.     Findings: No erythema or rash.     Comments: Fair complexion   Neurological:     Mental Status: She is alert. Mental status is at baseline.     Sensory: No sensory deficit.     Coordination: Coordination normal.     Deep Tendon Reflexes: Reflexes are normal and symmetric.  Psychiatric:        Mood and Affect: Mood normal.           Assessment & Plan:   Problem List Items Addressed This Visit      Cardiovascular and Mediastinum   Essential hypertension - Primary    bp in fair control at this time  BP Readings from Last 1 Encounters:  12/05/19 140/66   No changes needed Most recent labs reviewed  Disc lifstyle change with low sodium diet and exercise  Labs today      Relevant Orders   Comprehensive metabolic panel (Completed)   Lipid panel (Completed)   TSH (Completed)   CBC with Differential/Platelet (Completed)     Digestive   GERD    Continues omeprazole and in good control         Other   Vitamin B12 deficiency    Pt taking oral B12  Bariatric surgery in past and also on ppi      Relevant Orders   Vitamin B12 (Completed)   Vitamin D deficiency    D level today  Pt supplements orally otc  Past bariatric surgery  Disc imp for bone and overall health      Relevant Orders   VITAMIN D 25 Hydroxy (Vit-D Deficiency, Fractures) (Completed)   HYPERCHOLESTEROLEMIA    Due for lipid check Disc goals for lipids and reasons to control them Rev last labs with pt Rev low sat fat diet in detail       Relevant Orders   Lipid panel (Completed)   Class 2 severe obesity due to excess calories  with serious comorbidity and body mass index (BMI) of 37.0 to 37.9 in adult [facility])    Discussed how this problem influences overall health and the risks it imposes  Reviewed plan for weight loss with lower calorie diet (via better food choices and also portion control or program like weight  watchers) and exercise building up to or more than 30 minutes 5 days per week including some aerobic activity   Has gained since bariatric surgery  Urged to make better diet choices and gradually inc exercise as tolerated      Colon cancer screening    Signed up for cologuard       Prediabetes    Due for A1C Continues to gain weight  Urged better diet changes      Relevant Orders   Hemoglobin A1c (Completed)   History of breast cancer    Taking tamoxifen-in final year  Continues gabapentin and effexor xr for hot flashes  mammog was 9/20 Continues oncol f/u

## 2019-12-05 NOTE — Patient Instructions (Addendum)
Make sure you get protein from plant or dairy sources with every meal   Keep exercising   Labs today   We will sign you up for cologuard test for colon screening

## 2019-12-07 NOTE — Assessment & Plan Note (Signed)
Continues omeprazole and in good control

## 2019-12-07 NOTE — Assessment & Plan Note (Signed)
Signed up for cologuard

## 2019-12-07 NOTE — Assessment & Plan Note (Signed)
Discussed how this problem influences overall health and the risks it imposes  Reviewed plan for weight loss with lower calorie diet (via better food choices and also portion control or program like weight watchers) and exercise building up to or more than 30 minutes 5 days per week including some aerobic activity   Has gained since bariatric surgery  Urged to make better diet choices and gradually inc exercise as tolerated

## 2019-12-07 NOTE — Assessment & Plan Note (Signed)
Taking tamoxifen-in final year  Continues gabapentin and effexor xr for hot flashes  mammog was 9/20 Continues oncol f/u

## 2019-12-07 NOTE — Assessment & Plan Note (Signed)
Due for A1C Continues to gain weight  Urged better diet changes

## 2019-12-07 NOTE — Assessment & Plan Note (Signed)
Pt taking oral B12  Bariatric surgery in past and also on ppi

## 2019-12-07 NOTE — Assessment & Plan Note (Signed)
D level today  Pt supplements orally otc  Past bariatric surgery  Disc imp for bone and overall health

## 2019-12-07 NOTE — Assessment & Plan Note (Addendum)
bp in fair control at this time  BP Readings from Last 1 Encounters:  12/05/19 140/66   No changes needed Most recent labs reviewed  Disc lifstyle change with low sodium diet and exercise  Labs today

## 2019-12-07 NOTE — Assessment & Plan Note (Signed)
Due for lipid check Disc goals for lipids and reasons to control them Rev last labs with pt Rev low sat fat diet in detail

## 2019-12-09 ENCOUNTER — Telehealth: Payer: Self-pay | Admitting: *Deleted

## 2019-12-09 NOTE — Telephone Encounter (Signed)
-----   Message from Abner Greenspan, MD sent at 12/08/2019  7:01 PM EDT ----- Please get vit D3 otc and take 4000 iu daily  Please add to med list

## 2019-12-09 NOTE — Telephone Encounter (Signed)
Pt notified and med list updated.

## 2020-01-01 DIAGNOSIS — M1711 Unilateral primary osteoarthritis, right knee: Secondary | ICD-10-CM | POA: Diagnosis not present

## 2020-01-01 DIAGNOSIS — M545 Low back pain: Secondary | ICD-10-CM | POA: Diagnosis not present

## 2020-01-05 ENCOUNTER — Other Ambulatory Visit: Payer: Self-pay | Admitting: Family Medicine

## 2020-01-05 DIAGNOSIS — Z1212 Encounter for screening for malignant neoplasm of rectum: Secondary | ICD-10-CM | POA: Diagnosis not present

## 2020-01-05 DIAGNOSIS — Z1211 Encounter for screening for malignant neoplasm of colon: Secondary | ICD-10-CM | POA: Diagnosis not present

## 2020-01-05 LAB — COLOGUARD: Cologuard: NEGATIVE

## 2020-01-07 ENCOUNTER — Telehealth: Payer: Self-pay | Admitting: Family Medicine

## 2020-01-07 DIAGNOSIS — R7989 Other specified abnormal findings of blood chemistry: Secondary | ICD-10-CM

## 2020-01-07 DIAGNOSIS — E039 Hypothyroidism, unspecified: Secondary | ICD-10-CM | POA: Insufficient documentation

## 2020-01-07 NOTE — Telephone Encounter (Signed)
-----   Message from Ellamae Sia sent at 12/24/2019 12:16 PM EDT ----- Regarding: Lab orders for Thursday, 6.10.21 Labs for 1 month f/u

## 2020-01-08 ENCOUNTER — Other Ambulatory Visit (INDEPENDENT_AMBULATORY_CARE_PROVIDER_SITE_OTHER): Payer: Medicare Other

## 2020-01-08 ENCOUNTER — Other Ambulatory Visit: Payer: Self-pay

## 2020-01-08 DIAGNOSIS — R7989 Other specified abnormal findings of blood chemistry: Secondary | ICD-10-CM

## 2020-01-08 LAB — EXTERNAL GENERIC LAB PROCEDURE: COLOGUARD: NEGATIVE

## 2020-01-08 LAB — COLOGUARD: COLOGUARD: NEGATIVE

## 2020-01-08 LAB — TSH: TSH: 5.2 u[IU]/mL — ABNORMAL HIGH (ref 0.35–4.50)

## 2020-01-08 LAB — T4, FREE: Free T4: 1.41 ng/dL (ref 0.60–1.60)

## 2020-01-09 ENCOUNTER — Telehealth: Payer: Self-pay | Admitting: *Deleted

## 2020-01-09 NOTE — Telephone Encounter (Signed)
Left VM requesting pt to call the office back regarding lab results  

## 2020-01-13 NOTE — Telephone Encounter (Signed)
Addressed through lab results  

## 2020-01-16 ENCOUNTER — Encounter: Payer: Self-pay | Admitting: Family Medicine

## 2020-01-25 ENCOUNTER — Other Ambulatory Visit: Payer: Self-pay | Admitting: Family Medicine

## 2020-03-26 ENCOUNTER — Ambulatory Visit (INDEPENDENT_AMBULATORY_CARE_PROVIDER_SITE_OTHER): Payer: Medicare Other

## 2020-03-26 ENCOUNTER — Other Ambulatory Visit: Payer: Self-pay

## 2020-03-26 DIAGNOSIS — Z0000 Encounter for general adult medical examination without abnormal findings: Secondary | ICD-10-CM

## 2020-03-26 NOTE — Patient Instructions (Signed)
Jennifer Bartlett , Thank you for taking time to come for your Medicare Wellness Visit. I appreciate your ongoing commitment to your health goals. Please review the following plan we discussed and let me know if I can assist you in the future.   Screening recommendations/referrals: Colonoscopy: Cologuard completed 01/05/2020, due 12/2022 Mammogram: Up to date, completed 04/25/2019, due 03/2020 Bone Density: due, Cal to have this completed as soon as you can.  Recommended yearly ophthalmology/optometry visit for glaucoma screening and checkup Recommended yearly dental visit for hygiene and checkup  Vaccinations: Influenza vaccine: due, will receive this in office ar at pharmacy  Pneumococcal vaccine: Completed series Tdap vaccine: Up to date, completed 07/04/2016, due 06/2026 Shingles vaccine: due, check with your insurance regarding coverage    Covid-19: Completed series  Advanced directives: Advance directive discussed with you today. I have provided a copy for you to complete at home and have notarized. Once this is complete please bring a copy in to our office so we can scan it into your chart.  Conditions/risks identified: hypertension, hypercholesterolemia  Next appointment: Follow up in one year for your annual wellness visit    Preventive Care 40 Years and Older, Female Preventive care refers to lifestyle choices and visits with your health care provider that can promote health and wellness. What does preventive care include?  A yearly physical exam. This is also called an annual well check.  Dental exams once or twice a year.  Routine eye exams. Ask your health care provider how often you should have your eyes checked.  Personal lifestyle choices, including:  Daily care of your teeth and gums.  Regular physical activity.  Eating a healthy diet.  Avoiding tobacco and drug use.  Limiting alcohol use.  Practicing safe sex.  Taking low-dose aspirin every day.  Taking  vitamin and mineral supplements as recommended by your health care provider. What happens during an annual well check? The services and screenings done by your health care provider during your annual well check will depend on your age, overall health, lifestyle risk factors, and family history of disease. Counseling  Your health care provider may ask you questions about your:  Alcohol use.  Tobacco use.  Drug use.  Emotional well-being.  Home and relationship well-being.  Sexual activity.  Eating habits.  History of falls.  Memory and ability to understand (cognition).  Work and work Statistician.  Reproductive health. Screening  You may have the following tests or measurements:  Height, weight, and BMI.  Blood pressure.  Lipid and cholesterol levels. These may be checked every 5 years, or more frequently if you are over 39 years old.  Skin check.  Lung cancer screening. You may have this screening every year starting at age 61 if you have a 30-pack-year history of smoking and currently smoke or have quit within the past 15 years.  Fecal occult blood test (FOBT) of the stool. You may have this test every year starting at age 73.  Flexible sigmoidoscopy or colonoscopy. You may have a sigmoidoscopy every 5 years or a colonoscopy every 10 years starting at age 33.  Hepatitis C blood test.  Hepatitis B blood test.  Sexually transmitted disease (STD) testing.  Diabetes screening. This is done by checking your blood sugar (glucose) after you have not eaten for a while (fasting). You may have this done every 1-3 years.  Bone density scan. This is done to screen for osteoporosis. You may have this done starting at age 54.  Mammogram. This may be done every 1-2 years. Talk to your health care provider about how often you should have regular mammograms. Talk with your health care provider about your test results, treatment options, and if necessary, the need for more  tests. Vaccines  Your health care provider may recommend certain vaccines, such as:  Influenza vaccine. This is recommended every year.  Tetanus, diphtheria, and acellular pertussis (Tdap, Td) vaccine. You may need a Td booster every 10 years.  Zoster vaccine. You may need this after age 46.  Pneumococcal 13-valent conjugate (PCV13) vaccine. One dose is recommended after age 68.  Pneumococcal polysaccharide (PPSV23) vaccine. One dose is recommended after age 22. Talk to your health care provider about which screenings and vaccines you need and how often you need them. This information is not intended to replace advice given to you by your health care provider. Make sure you discuss any questions you have with your health care provider. Document Released: 08/13/2015 Document Revised: 04/05/2016 Document Reviewed: 05/18/2015 Elsevier Interactive Patient Education  2017 [place] Prevention in the Home Falls can cause injuries. They can happen to people of all ages. There are many things you can do to make your home safe and to help prevent falls. What can I do on the outside of my home?  Regularly fix the edges of walkways and driveways and fix any cracks.  Remove anything that might make you trip as you walk through a door, such as a raised step or threshold.  Trim any bushes or trees on the path to your home.  Use bright outdoor lighting.  Clear any walking paths of anything that might make someone trip, such as rocks or tools.  Regularly check to see if handrails are loose or broken. Make sure that both sides of any steps have handrails.  Any raised decks and porches should have guardrails on the edges.  Have any leaves, snow, or ice cleared regularly.  Use sand or salt on walking paths during winter.  Clean up any spills in your garage right away. This includes oil or grease spills. What can I do in the bathroom?  Use night lights.  Install grab bars by the  toilet and in the tub and shower. Do not use towel bars as grab bars.  Use non-skid mats or decals in the tub or shower.  If you need to sit down in the shower, use a plastic, non-slip stool.  Keep the floor dry. Clean up any water that spills on the floor as soon as it happens.  Remove soap buildup in the tub or shower regularly.  Attach bath mats securely with double-sided non-slip rug tape.  Do not have throw rugs and other things on the floor that can make you trip. What can I do in the bedroom?  Use night lights.  Make sure that you have a light by your bed that is easy to reach.  Do not use any sheets or blankets that are too big for your bed. They should not hang down onto the floor.  Have a firm chair that has side arms. You can use this for support while you get dressed.  Do not have throw rugs and other things on the floor that can make you trip. What can I do in the kitchen?  Clean up any spills right away.  Avoid walking on wet floors.  Keep items that you use a lot in easy-to-reach places.  If you need to reach  something above you, use a strong step stool that has a grab bar.  Keep electrical cords out of the way.  Do not use floor polish or wax that makes floors slippery. If you must use wax, use non-skid floor wax.  Do not have throw rugs and other things on the floor that can make you trip. What can I do with my stairs?  Do not leave any items on the stairs.  Make sure that there are handrails on both sides of the stairs and use them. Fix handrails that are broken or loose. Make sure that handrails are as long as the stairways.  Check any carpeting to make sure that it is firmly attached to the stairs. Fix any carpet that is loose or worn.  Avoid having throw rugs at the top or bottom of the stairs. If you do have throw rugs, attach them to the floor with carpet tape.  Make sure that you have a light switch at the top of the stairs and the bottom of  the stairs. If you do not have them, ask someone to add them for you. What else can I do to help prevent falls?  Wear shoes that:  Do not have high heels.  Have rubber bottoms.  Are comfortable and fit you well.  Are closed at the toe. Do not wear sandals.  If you use a stepladder:  Make sure that it is fully opened. Do not climb a closed stepladder.  Make sure that both sides of the stepladder are locked into place.  Ask someone to hold it for you, if possible.  Clearly mark and make sure that you can see:  Any grab bars or handrails.  First and last steps.  Where the edge of each step is.  Use tools that help you move around (mobility aids) if they are needed. These include:  Canes.  Walkers.  Scooters.  Crutches.  Turn on the lights when you go into a dark area. Replace any light bulbs as soon as they burn out.  Set up your furniture so you have a clear path. Avoid moving your furniture around.  If any of your floors are uneven, fix them.  If there are any pets around you, be aware of where they are.  Review your medicines with your doctor. Some medicines can make you feel dizzy. This can increase your chance of falling. Ask your doctor what other things that you can do to help prevent falls. This information is not intended to replace advice given to you by your health care provider. Make sure you discuss any questions you have with your health care provider. Document Released: 05/13/2009 Document Revised: 12/23/2015 Document Reviewed: 08/21/2014 Elsevier Interactive Patient Education  2017 [employer].

## 2020-03-26 NOTE — Progress Notes (Signed)
Subjective:   Jennifer Bartlett is a 74 y.o. female who presents for Medicare Annual (Subsequent) preventive examination.  Review of Systems: N/A      I connected with the patient today by telephone and verified that I am speaking with the correct person using two identifiers. Location patient: home Location nurse: work Persons participating in the telephone visit: patient, nurse.   I discussed the limitations, risks, security and privacy concerns of performing an evaluation and management service by telephone and the availability of in person appointments. I also discussed with the patient that there may be a patient responsible charge related to this service. The patient expressed understanding and verbally consented to this telephonic visit.        Cardiac Risk Factors include: advanced age (>1men, >30 women);hypertension;Other (see comment), Risk factor comments: hypercholesterolemia     Objective:    Today's Vitals   There is no height or weight on file to calculate BMI.  Advanced Directives 03/26/2020 06/30/2016 05/03/2016 12/01/2015 12/01/2015 09/29/2015 07/21/2015  Does Patient Have a Medical Advance Directive? No Yes No No No No No  Type of Advance Directive - Healthcare Power of [place];Living will - - - - -  Copy of [place] in Chart? - No - copy requested - - - - -  Would patient like information on creating a medical advance directive? Yes (MAU/Ambulatory/Procedural Areas - Information given) - - - - - -    Current Medications (verified) Outpatient Encounter Medications as of 03/26/2020  Medication Sig  . BIOTIN PO Take 1 tablet by mouth daily.  Marland Kitchen CALCIUM PO Take by mouth daily.    . Cholecalciferol (D3 VITAMIN PO) Take 4,000 Units by mouth daily.  Marland Kitchen gabapentin (NEURONTIN) 300 MG capsule Take 2 capsules (600 mg total) by mouth at bedtime.  . hydrochlorothiazide (HYDRODIURIL) 25 MG tablet Take 1 tablet by mouth once daily  . loratadine (CLARITIN) 10  MG tablet Take 10 mg by mouth daily.  . Multiple Vitamin (MULTIVITAMIN) capsule Take 1 capsule by mouth daily.    Marland Kitchen omeprazole (PRILOSEC) 20 MG capsule Take 1 capsule by mouth once daily  . tamoxifen (NOLVADEX) 20 MG tablet Take 1 tablet (20 mg total) by mouth daily.  Marland Kitchen venlafaxine XR (EFFEXOR-XR) 150 MG 24 hr capsule Take 1 capsule (150 mg total) by mouth daily with breakfast.   No facility-administered encounter medications on file as of 03/26/2020.    Allergies (verified) Crestor [rosuvastatin calcium], Lipitor [atorvastatin calcium], Penicillins, and Zocor [simvastatin - high dose]   History: Past Medical History:  Diagnosis Date  . Anemia   . Anxiety   . Breast cancer ([place])   . Breast cancer of upper-inner quadrant of right female breast ([place]) 02/26/2015  . GERD (gastroesophageal reflux disease)   . Hot flashes   . Hyperlipidemia   . Hypertension   . Hypoglycemia   . Obesity   . Personal history of radiation therapy   . Vitamin B 12 deficiency    Past Surgical History:  Procedure Laterality Date  . ABDOMINAL HYSTERECTOMY    . BREAST BIOPSY    . BREAST LUMPECTOMY Right    2016  . GASTRIC BYPASS  08/2003  . HERNIA REPAIR    . KNEE SURGERY     left  . PARTIAL HYSTERECTOMY  1990's   bleeding  . RADIOACTIVE SEED GUIDED PARTIAL MASTECTOMY WITH AXILLARY SENTINEL LYMPH NODE BIOPSY Right 03/09/2015   Procedure: RADIOACTIVE SEED GUIDED RIGHT PARTIAL MASTECTOMY WITH AXILLARY  SENTINEL LYMPH NODE BIOPSY;  Surgeon: Stark Klein, MD;  Location: [facility];  Service: General;  Laterality: Right;  . TUBAL LIGATION     Family History  Problem Relation Age of Onset  . Heart disease Mother        MI  . Hypertension Mother   . Alcohol abuse Father   . Heart disease Father        smoker  . Hypertension Father   . Breast cancer Paternal Grandmother   . Diabetes Maternal Aunt    Social History   Socioeconomic History  . Marital status: Married    Spouse name: Not on  file  . Number of children: Not on file  . Years of education: Not on file  . Highest education level: Not on file  Occupational History    Employer: [employer]  Tobacco Use  . Smoking status: Never Smoker  . Smokeless tobacco: Never Used  Substance and Sexual Activity  . Alcohol use: No  . Drug use: No  . Sexual activity: Yes    Partners: Male    Birth control/protection: Surgical    Comment: Hyst.  Other Topics Concern  . Not on file  Social History Narrative   Regular walking for exercise.   Social Determinants of Health   Financial Resource Strain: Low Risk   . Difficulty of Paying Living Expenses: Not hard at all  Food Insecurity: No Food Insecurity  . Worried About Charity fundraiser in the Last Year: Never true  . Ran Out of Food in the Last Year: Never true  Transportation Needs: No Transportation Needs  . Lack of Transportation (Medical): No  . Lack of Transportation (Non-Medical): No  Physical Activity: Insufficiently Active  . Days of Exercise per Week: 3 days  . Minutes of Exercise per Session: 20 min  Stress: No Stress Concern Present  . Feeling of Stress : Not at all  Social Connections:   . Frequency of Communication with Friends and Family: Not on file  . Frequency of Social Gatherings with Friends and Family: Not on file  . Attends Religious Services: Not on file  . Active Member of Clubs or Organizations: Not on file  . Attends Archivist Meetings: Not on file  . Marital Status: Not on file    Tobacco Counseling Counseling given: Not Answered   Clinical Intake:  Pre-visit preparation completed: Yes  Pain : No/denies pain     Nutritional Risks: None Diabetes: No  How often do you need to have someone help you when you read instructions, pamphlets, or other written materials from your doctor or pharmacy?: 1 - Never What is the last grade level you completed in school?: bachelors  Diabetic: No Nutrition Risk  Assessment:  Has the patient had any N/V/D within the last 2 months?  No  Does the patient have any non-healing wounds?  No  Has the patient had any unintentional weight loss or weight gain?  No   Diabetes:  Is the patient diabetic?  No  If diabetic, was a CBG obtained today?  N/A Did the patient bring in their glucometer from home?  N/A How often do you monitor your CBG's? N/A.   Financial Strains and Diabetes Management:  Are you having any financial strains with the device, your supplies or your medication? N/A.  Does the patient want to be seen by Chronic Care Management for management of their diabetes?  N/A Would the patient like to be  referred to a Nutritionist or for Diabetic Management?  N/A   Interpreter Needed?: No  Information entered by :: CJohnson, LPN   Activities of Daily Living In your present state of health, do you have any difficulty performing the following activities: 03/26/2020  Hearing? N  Vision? N  Difficulty concentrating or making decisions? N  Walking or climbing stairs? N  Dressing or bathing? N  Doing errands, shopping? N  Preparing Food and eating ? N  Using the Toilet? N  In the past six months, have you accidently leaked urine? N  Do you have problems with loss of bowel control? N  Managing your Medications? N  Managing your Finances? N  Housekeeping or managing your Housekeeping? N  Some recent data might be hidden    Patient Care Team: Tower, Wynelle Fanny, MD as PCP - General Annamaria Boots Candiss Norse, NP as Nurse Practitioner (Obstetrics and Gynecology) Stark Klein, MD as Consulting Physician (General Surgery) Magrinat, Virgie Dad, MD as Consulting Physician (Oncology) Mauro Kaufmann, RN as Registered Nurse Rockwell Germany, RN as Registered Nurse Jake Shark, Johny Blamer, NP as Nurse Practitioner (Nurse Practitioner) Vickey Huger, MD as Consulting Physician (Orthopedic Surgery)  Indicate any recent Medical Services you may have received from  other than [facility] providers in the past year (date may be approximate).     Assessment:   This is a routine wellness examination for Jennifer Bartlett.  Hearing/Vision screen  Hearing Screening   125Hz  250Hz  500Hz  1000Hz  2000Hz  3000Hz  4000Hz  6000Hz  8000Hz   Right ear:           Left ear:           Vision Screening Comments: Patient gets annual eye exams   Dietary issues and exercise activities discussed: Current Exercise Habits: Home exercise routine, Type of exercise: walking, Time (Minutes): 20, Frequency (Times/Week): 3, Weekly Exercise (Minutes/Week): 60, Intensity: Moderate, Exercise limited by: None identified  Goals    . Increase physical activity     Starting 06/30/2016, I will continue to walk at least 20 min 2-3 days per week.     . Patient Stated     03/26/2020, I will continue to walk 2-3 times a week for about 20 minutes.       Depression Screen PHQ 2/9 Scores 03/26/2020 12/05/2019 07/16/2017 06/30/2016 03/23/2015 09/06/2012  PHQ - 2 Score 0 0 0 0 0 0  PHQ- 9 Score 0 2 - - - -    Fall Risk Fall Risk  03/26/2020 07/16/2017 06/30/2016 03/23/2015 09/06/2012  Falls in the past year? 0 No Yes No No  Comment - - accidental fall in kitchen on liquip soap dropped by grandchildren - -  Number falls in past yr: 0 - 1 - -  Injury with Fall? 0 - No - -  Risk for fall due to : No Fall Risks - - - -  Follow up Falls evaluation completed;Falls prevention discussed - Falls evaluation completed - -    Any stairs in or around the home? Yes  If so, are there any without handrails? No  Home free of loose throw rugs in walkways, pet beds, electrical cords, etc? Yes  Adequate lighting in your home to reduce risk of falls? Yes   ASSISTIVE DEVICES UTILIZED TO PREVENT FALLS:  Life alert? No  Use of a cane, walker or w/c? No  Grab bars in the bathroom? No  Shower chair or bench in shower? No  Elevated toilet seat or a handicapped toilet? No  TIMED UP AND GO:  Was the test performed? N/A. Telephonic  visit  Cognitive Function: MMSE - Mini Mental State Exam 03/26/2020 06/30/2016  Orientation to time 5 5  Orientation to Place 5 5  Registration 3 3  Attention/ Calculation 5 0  Recall 3 1  Recall-comments - pt was unable to recall 2 of 3 words  Language- name 2 objects - 0  Language- repeat 1 1  Language- follow 3 step command - 3  Language- read & follow direction - 0  Write a sentence - 0  Copy design - 0  Total score - 18  Mini Cog  Mini-Cog screen was completed. Maximum score is 22. A value of 0 denotes this part of the MMSE was not completed or the patient failed this part of the Mini-Cog screening.       Immunizations Immunization History  Administered Date(s) Administered  . Fluad Quad(high Dose 65+) 05/15/2019  . Influenza Split 04/25/2011  . Influenza Whole 08/01/2003, 04/20/2010  . Influenza,inj,Quad PF,6+ Mos 05/15/2013, 04/20/2014, 06/30/2015, 07/06/2017  . Influenza-Unspecified 05/14/2016  . PFIZER SARS-COV-2 Vaccination 09/22/2019, 10/13/2019  . Pneumococcal Conjugate-13 08/18/2014  . Pneumococcal Polysaccharide-23 09/06/2012  . Td 11/17/2004  . Tdap 07/04/2016  . Zoster 06/21/2010    TDAP status: Up to date Flu Vaccine status: due, will have this completed in the office or at pharmacy Pneumococcal vaccine status: Up to date Covid-19 vaccine status: Completed vaccines  Qualifies for Shingles Vaccine? Yes   Zostavax completed Yes   Shingrix Completed?: No.    Education has been provided regarding the importance of this vaccine. Patient has been advised to call insurance company to determine out of pocket expense if they have not yet received this vaccine. Advised may also receive vaccine at local pharmacy or Health Dept. Verbalized acceptance and understanding.  Screening Tests Health Maintenance  Topic Date Due  . INFLUENZA VACCINE  02/29/2020  . MAMMOGRAM  04/24/2021  . Fecal DNA (Cologuard)  01/05/2023  . TETANUS/TDAP  07/04/2026  . DEXA SCAN   Completed  . COVID-19 Vaccine  Completed  . Hepatitis C Screening  Completed  . PNA vac Low Risk Adult  Completed    Health Maintenance  Health Maintenance Due  Topic Date Due  . INFLUENZA VACCINE  02/29/2020    Colorectal cancer screening: Completed Cologuard completed 01/05/2020. Repeat every 3 years Mammogram status: Completed 04/25/2019. Repeat every year Bone Density status: due, will call to have this setup once ordered by provider.   Lung Cancer Screening: (Low Dose CT Chest recommended if Age 93-80 years, 30 pack-year currently smoking OR have quit w/in 15 years.) does not qualify.    Additional Screening:  Hepatitis C Screening: does qualify; Completed 06/30/2016  Vision Screening: Recommended annual ophthalmology exams for early detection of glaucoma and other disorders of the eye. Is the patient up to date with their annual eye exam?  Yes  Who is the provider or what is the name of the office in which the patient attends annual eye exams? Dr. Chong Sicilian If pt is not established with a provider, would they like to be referred to a provider to establish care? No .   Dental Screening: Recommended annual dental exams for proper oral hygiene  Community Resource Referral / Chronic Care Management: CRR required this visit?  No   CCM required this visit?  No      Plan:     I have personally reviewed and noted the following in the patient's chart:   .  Medical and social history . Use of alcohol, tobacco or illicit drugs  . Current medications and supplements . Functional ability and status . Nutritional status . Physical activity . Advanced directives . List of other physicians . Hospitalizations, surgeries, and ER visits in previous 12 months . Vitals . Screenings to include cognitive, depression, and falls . Referrals and appointments  In addition, I have reviewed and discussed with patient certain preventive protocols, quality metrics, and best practice  recommendations. A written personalized care plan for preventive services as well as general preventive health recommendations were provided to patient.   Due to this being a telephonic visit, the after visit summary with patients personalized plan was offered to patient via mail or my-chart. Patient preferred to pick up at office at next visit.   Andrez Grime, LPN   [id number]

## 2020-03-26 NOTE — Progress Notes (Signed)
PCP notes:  Health Maintenance: Flu- due Shingrix- due Dexa- due   Abnormal Screenings: none   Patient concerns: none   Nurse concerns: none   Next PCP appt.: none

## 2020-04-14 ENCOUNTER — Ambulatory Visit: Payer: Medicare Other

## 2020-05-01 ENCOUNTER — Telehealth: Payer: Self-pay | Admitting: Family Medicine

## 2020-05-01 DIAGNOSIS — R7989 Other specified abnormal findings of blood chemistry: Secondary | ICD-10-CM

## 2020-05-01 DIAGNOSIS — E559 Vitamin D deficiency, unspecified: Secondary | ICD-10-CM

## 2020-05-01 DIAGNOSIS — E7800 Pure hypercholesterolemia, unspecified: Secondary | ICD-10-CM

## 2020-05-01 DIAGNOSIS — E538 Deficiency of other specified B group vitamins: Secondary | ICD-10-CM

## 2020-05-01 DIAGNOSIS — I10 Essential (primary) hypertension: Secondary | ICD-10-CM

## 2020-05-01 DIAGNOSIS — R7303 Prediabetes: Secondary | ICD-10-CM

## 2020-05-01 NOTE — Telephone Encounter (Signed)
-----   Message from Cloyd Stagers, RT sent at 04/20/2020  2:34 PM EDT ----- Regarding: Lab Orders for Monday 10.4.2021 Please place lab orders for Monday 10.4.2021, office visit for physical on Tuesday 10.12.2021 Thank you, Dyke Maes RT(R)

## 2020-05-03 ENCOUNTER — Other Ambulatory Visit: Payer: Self-pay

## 2020-05-03 ENCOUNTER — Other Ambulatory Visit (INDEPENDENT_AMBULATORY_CARE_PROVIDER_SITE_OTHER): Payer: Medicare Other

## 2020-05-03 DIAGNOSIS — E538 Deficiency of other specified B group vitamins: Secondary | ICD-10-CM | POA: Diagnosis not present

## 2020-05-03 DIAGNOSIS — R7303 Prediabetes: Secondary | ICD-10-CM

## 2020-05-03 DIAGNOSIS — R7989 Other specified abnormal findings of blood chemistry: Secondary | ICD-10-CM

## 2020-05-03 DIAGNOSIS — E7800 Pure hypercholesterolemia, unspecified: Secondary | ICD-10-CM

## 2020-05-03 DIAGNOSIS — I10 Essential (primary) hypertension: Secondary | ICD-10-CM | POA: Diagnosis not present

## 2020-05-03 DIAGNOSIS — E559 Vitamin D deficiency, unspecified: Secondary | ICD-10-CM

## 2020-05-03 LAB — COMPREHENSIVE METABOLIC PANEL
ALT: 10 U/L (ref 0–35)
AST: 13 U/L (ref 0–37)
Albumin: 4 g/dL (ref 3.5–5.2)
Alkaline Phosphatase: 55 U/L (ref 39–117)
BUN: 23 mg/dL (ref 6–23)
CO2: 31 mEq/L (ref 19–32)
Calcium: 8.7 mg/dL (ref 8.4–10.5)
Chloride: 99 mEq/L (ref 96–112)
Creatinine, Ser: 1.02 mg/dL (ref 0.40–1.20)
GFR: 53.11 mL/min — ABNORMAL LOW (ref >60.00)
Glucose, Bld: 119 mg/dL — ABNORMAL HIGH (ref 70–99)
Potassium: 3.8 mEq/L (ref 3.5–5.1)
Sodium: 139 mEq/L (ref 135–145)
Total Bilirubin: 0.4 mg/dL (ref 0.2–1.2)
Total Protein: 6.9 g/dL (ref 6.0–8.3)

## 2020-05-03 LAB — VITAMIN D 25 HYDROXY (VIT D DEFICIENCY, FRACTURES): VITD: 84.53 ng/mL (ref 30.00–100.00)

## 2020-05-03 LAB — CBC WITH DIFFERENTIAL/PLATELET
Basophils Absolute: 0.1 10*3/uL (ref 0.0–0.1)
Basophils Relative: 0.7 % (ref 0.0–3.0)
Eosinophils Absolute: 0.3 10*3/uL (ref 0.0–0.7)
Eosinophils Relative: 3.9 % (ref 0.0–5.0)
HCT: 35.8 % — ABNORMAL LOW (ref 36.0–46.0)
Hemoglobin: 11.6 g/dL — ABNORMAL LOW (ref 12.0–15.0)
Lymphocytes Relative: 42 % (ref 12.0–46.0)
Lymphs Abs: 3.5 10*3/uL (ref 0.7–4.0)
MCHC: 32.3 g/dL (ref 30.0–36.0)
MCV: 89.3 fl (ref 78.0–100.0)
Monocytes Absolute: 0.6 10*3/uL (ref 0.1–1.0)
Monocytes Relative: 7.7 % (ref 3.0–12.0)
Neutro Abs: 3.8 10*3/uL (ref 1.4–7.7)
Neutrophils Relative %: 45.7 % (ref 43.0–77.0)
Platelets: 282 10*3/uL (ref 150.0–400.0)
RBC: 4.01 Mil/uL (ref 3.87–5.11)
RDW: 15.9 % — ABNORMAL HIGH (ref 11.5–15.5)
WBC: 8.3 10*3/uL (ref 4.0–10.5)

## 2020-05-03 LAB — LIPID PANEL
Cholesterol: 222 mg/dL — ABNORMAL HIGH (ref 0–200)
HDL: 59.6 mg/dL (ref >39.00)
LDL Cholesterol: 136 mg/dL — ABNORMAL HIGH (ref 0–99)
NonHDL: 162.75
Total CHOL/HDL Ratio: 4
Triglycerides: 133 mg/dL (ref 0.0–149.0)
VLDL: 26.6 mg/dL (ref 0.0–40.0)

## 2020-05-03 LAB — VITAMIN B12: Vitamin B-12: 280 pg/mL (ref 211–911)

## 2020-05-03 LAB — HEMOGLOBIN A1C: Hgb A1c MFr Bld: 6.2 % (ref 4.6–6.5)

## 2020-05-03 LAB — TSH: TSH: 7.89 u[IU]/mL — ABNORMAL HIGH (ref 0.35–4.50)

## 2020-05-11 ENCOUNTER — Other Ambulatory Visit: Payer: Self-pay

## 2020-05-11 ENCOUNTER — Encounter: Payer: Self-pay | Admitting: Family Medicine

## 2020-05-11 ENCOUNTER — Ambulatory Visit (INDEPENDENT_AMBULATORY_CARE_PROVIDER_SITE_OTHER): Payer: Medicare Other | Admitting: Family Medicine

## 2020-05-11 VITALS — BP 132/88 | HR 80 | Temp 97.6°F | Ht 63.5 in | Wt 219.0 lb

## 2020-05-11 DIAGNOSIS — E7800 Pure hypercholesterolemia, unspecified: Secondary | ICD-10-CM

## 2020-05-11 DIAGNOSIS — Z23 Encounter for immunization: Secondary | ICD-10-CM | POA: Diagnosis not present

## 2020-05-11 DIAGNOSIS — E538 Deficiency of other specified B group vitamins: Secondary | ICD-10-CM

## 2020-05-11 DIAGNOSIS — E039 Hypothyroidism, unspecified: Secondary | ICD-10-CM

## 2020-05-11 DIAGNOSIS — I10 Essential (primary) hypertension: Secondary | ICD-10-CM

## 2020-05-11 DIAGNOSIS — Z1211 Encounter for screening for malignant neoplasm of colon: Secondary | ICD-10-CM | POA: Diagnosis not present

## 2020-05-11 DIAGNOSIS — Z0000 Encounter for general adult medical examination without abnormal findings: Secondary | ICD-10-CM

## 2020-05-11 DIAGNOSIS — Z6838 Body mass index (BMI) 38.0-38.9, adult: Secondary | ICD-10-CM

## 2020-05-11 DIAGNOSIS — E2839 Other primary ovarian failure: Secondary | ICD-10-CM

## 2020-05-11 DIAGNOSIS — Z853 Personal history of malignant neoplasm of breast: Secondary | ICD-10-CM

## 2020-05-11 DIAGNOSIS — R7303 Prediabetes: Secondary | ICD-10-CM

## 2020-05-11 DIAGNOSIS — E559 Vitamin D deficiency, unspecified: Secondary | ICD-10-CM

## 2020-05-11 MED ORDER — OMEPRAZOLE 20 MG PO CPDR
20.0000 mg | DELAYED_RELEASE_CAPSULE | Freq: Every day | ORAL | 3 refills | Status: DC
Start: 2020-05-11 — End: 2021-02-11

## 2020-05-11 MED ORDER — LEVOTHYROXINE SODIUM 25 MCG PO TABS: 25.0000 ug | ORAL_TABLET | Freq: Every day | ORAL | 3 refills | Status: DC

## 2020-05-11 MED ORDER — HYDROCHLOROTHIAZIDE 25 MG PO TABS
25.0000 mg | ORAL_TABLET | Freq: Every day | ORAL | 3 refills | Status: DC
Start: 2020-05-11 — End: 2021-02-11

## 2020-05-11 NOTE — Assessment & Plan Note (Signed)
Reviewed health habits including diet and exercise and skin cancer prevention Reviewed appropriate screening tests for age  Also reviewed health mt list, fam hx and immunization status , as well as social and family history   See HPI amw rev Labs rev  Flu shot given  Discussed shingrix  covid immunized and planning booster next wk utd mammogram in setting of past breast cancer cologuard neg 6/21 dexa ordered

## 2020-05-11 NOTE — Assessment & Plan Note (Signed)
bp in fair control at this time  BP Readings from Last 1 Encounters:  05/11/20 132/88   No changes needed Plan to continues hctz 25 mg daily  Most recent labs reviewed  Disc lifstyle change with low sodium diet and exercise

## 2020-05-11 NOTE — Assessment & Plan Note (Signed)
Disc goals for lipids and reasons to control them Rev last labs with pt Rev low sat fat diet in detail  LDL is down to 136 Intolerant of all statins Good HDL

## 2020-05-11 NOTE — Progress Notes (Signed)
Subjective:    Patient ID: Jennifer Bartlett, female    DOB: 22-Jul-1946, 74 y.o.   MRN: [id number]  This visit occurred during the SARS-CoV-2 public health emergency.  Safety protocols were in place, including screening questions prior to the visit, additional usage of staff PPE, and extensive cleaning of exam room while observing appropriate contact time as indicated for disinfecting solutions.    HPI Here for health maintenance exam and to review chronic medical problems   Wt Readings from Last 3 Encounters:  05/11/20 219 lb (99.3 kg)  12/05/19 216 lb 8 oz (98.2 kg)  06/09/19 213 lb 8 oz (96.8 kg)   38.19 kg/m  Full time care of grandkids - no time for self care  She uses a sitting pedaler for exercise    She had amw on 8/27 Reviewed today   Flu shot - today/high dose   Zoster status -zostavax 11/11 Interested in shingrix   covid status -vaccinated [employer] booster next week   Mammogram 9/20 - personal h/o breast cancer/ her mammogram is scheduled the end of next mo  Self breast exam -no changes/lumps Taking tamoxifen (hopes this is the last year) - hot flashes (she tolerates)   cologuard 6/21 -negative   dexa 6/17  Vit D level is 84.5 Takes ca and D  Does exercise  No falls or fx    HTN bp is stable today  No cp or palpitations or headaches or edema  No side effects to medicines  BP Readings from Last 3 Encounters:  05/11/20 132/88  12/05/19 140/66  06/09/19 (!) 142/75    Taking hctz 25 mg   Pulse Readings from Last 3 Encounters:  05/11/20 80  12/05/19 73  06/09/19 76    Hyperlipidemia Lab Results  Component Value Date   CHOL 222 (H) 05/03/2020   CHOL 227 (H) 12/05/2019   CHOL 176 07/06/2017   Lab Results  Component Value Date   HDL 59.60 05/03/2020   HDL 50.10 12/05/2019   HDL 46.90 07/06/2017   Lab Results  Component Value Date   LDLCALC 136 (H) 05/03/2020   LDLCALC 151 (H) 12/05/2019   LDLCALC 110 (H) 07/06/2017    Lab Results  Component Value Date   TRIG 133.0 05/03/2020   TRIG 129.0 12/05/2019   TRIG 96.0 07/06/2017   Lab Results  Component Value Date   CHOLHDL 4 05/03/2020   CHOLHDL 5 12/05/2019   CHOLHDL 4 07/06/2017   Lab Results  Component Value Date   LDLDIRECT 139.2 08/25/2013   LDLDIRECT 151.5 03/31/2011   HDL is up and LDL down- improved  Intol of all statins   Prediabetes Lab Results  Component Value Date   HGBA1C 6.2 05/03/2020  stable  Diet is fair  Does not eat sweets    B12 def Lab Results  Component Value Date   VITAMINB12 280 05/03/2020  bariatric surgery in past  Takes oral B12  Taking 1000 mcg daily    H/o elevated TSH Lab Results  Component Value Date   TSH 7.89 (H) 05/03/2020   last FT4 was 1.4 in June Is tired -blames on schedule  Some weight gain   Lab Results  Component Value Date   WBC 8.3 05/03/2020   HGB 11.6 (L) 05/03/2020   HCT 35.8 (L) 05/03/2020   MCV 89.3 05/03/2020   PLT 282.0 05/03/2020  no change in anemia  bariatric surgery status   Patient Active Problem List   Diagnosis Date  Noted  . Hypothyroid 01/07/2020  . History of breast cancer 12/05/2019  . Prediabetes 07/04/2016  . Routine general medical examination at a health care facility 07/04/2016  . Encounter for hepatitis C screening test for low risk patient 06/29/2016  . Estrogen deficiency 01/11/2016  . Malignant neoplasm of upper-inner quadrant of right breast in female, estrogen receptor positive ([place]) 02/26/2015  . Colon cancer screening 09/06/2012  . Vitamin D deficiency 12/28/2009  . STRESS REACTION, ACUTE, WITH EMOTIONAL DISTURBANCE 12/07/2009  . HYPOGLYCEMIA NOS 05/28/2007  . Vitamin B12 deficiency 03/07/2007  . HYPERCHOLESTEROLEMIA 03/07/2007  . Class 2 severe obesity due to excess calories with serious comorbidity and body mass index (BMI) of 38.0 to 38.9 in adult ([place]) 03/07/2007  . Essential hypertension 03/07/2007  . GERD 03/07/2007   Past Medical  History:  Diagnosis Date  . Anemia   . Anxiety   . Breast cancer ([place])   . Breast cancer of upper-inner quadrant of right female breast ([place]) 02/26/2015  . GERD (gastroesophageal reflux disease)   . Hot flashes   . Hyperlipidemia   . Hypertension   . Hypoglycemia   . Obesity   . Personal history of radiation therapy   . Vitamin B 12 deficiency    Past Surgical History:  Procedure Laterality Date  . ABDOMINAL HYSTERECTOMY    . BREAST BIOPSY    . BREAST LUMPECTOMY Right    2016  . GASTRIC BYPASS  08/2003  . HERNIA REPAIR    . KNEE SURGERY     left  . PARTIAL HYSTERECTOMY  1990's   bleeding  . RADIOACTIVE SEED GUIDED PARTIAL MASTECTOMY WITH AXILLARY SENTINEL LYMPH NODE BIOPSY Right 03/09/2015   Procedure: RADIOACTIVE SEED GUIDED RIGHT PARTIAL MASTECTOMY WITH AXILLARY SENTINEL LYMPH NODE BIOPSY;  Surgeon: Stark Klein, MD;  Location: [facility];  Service: General;  Laterality: Right;  . TUBAL LIGATION     Social History   Tobacco Use  . Smoking status: Never Smoker  . Smokeless tobacco: Never Used  Substance Use Topics  . Alcohol use: No  . Drug use: No   Family History  Problem Relation Age of Onset  . Heart disease Mother        MI  . Hypertension Mother   . Alcohol abuse Father   . Heart disease Father        smoker  . Hypertension Father   . Breast cancer Paternal Grandmother   . Diabetes Maternal Aunt    Allergies  Allergen Reactions  . Crestor [Rosuvastatin Calcium] Other (See Comments)    Myalgia   . Lipitor [Atorvastatin Calcium] Other (See Comments)    Aches and pains  . Penicillins Shortness Of Breath and Swelling  . Zocor [Simvastatin - High Dose] Other (See Comments)    Muscle/joint aches   Current Outpatient Medications on File Prior to Visit  Medication Sig Dispense Refill  . BIOTIN PO Take 1 tablet by mouth daily.    Marland Kitchen CALCIUM PO Take by mouth daily.      . Cholecalciferol (D3 VITAMIN PO) Take 4,000 Units by mouth daily.    Marland Kitchen  gabapentin (NEURONTIN) 300 MG capsule Take 2 capsules (600 mg total) by mouth at bedtime. 180 capsule 4  . loratadine (CLARITIN) 10 MG tablet Take 10 mg by mouth daily.    . Multiple Vitamin (MULTIVITAMIN) capsule Take 1 capsule by mouth daily.      . tamoxifen (NOLVADEX) 20 MG tablet Take 1 tablet (20 mg total) by  mouth daily. 90 tablet 4  . venlafaxine XR (EFFEXOR-XR) 150 MG 24 hr capsule Take 1 capsule (150 mg total) by mouth daily with breakfast. 90 capsule 4   No current facility-administered medications on file prior to visit.     Review of Systems  Constitutional: Positive for fatigue. Negative for activity change, appetite change, fever and unexpected weight change.  HENT: Negative for congestion, ear pain, rhinorrhea, sinus pressure and sore throat.   Eyes: Negative for pain, redness and visual disturbance.  Respiratory: Negative for cough, shortness of breath and wheezing.   Cardiovascular: Negative for chest pain and palpitations.  Gastrointestinal: Negative for abdominal pain, blood in stool, constipation and diarrhea.  Endocrine: Negative for polydipsia and polyuria.  Genitourinary: Negative for dysuria, frequency and urgency.  Musculoskeletal: Negative for arthralgias, back pain and myalgias.  Skin: Negative for pallor and rash.  Allergic/Immunologic: Negative for environmental allergies.  Neurological: Negative for dizziness, syncope and headaches.  Hematological: Negative for adenopathy. Does not bruise/bleed easily.  Psychiatric/Behavioral: Negative for decreased concentration and dysphoric mood. The patient is not nervous/anxious.        Objective:   Physical Exam Constitutional:      General: She is not in acute distress.    Appearance: Normal appearance. She is well-developed. She is obese. She is not ill-appearing or diaphoretic.  HENT:     Head: Normocephalic and atraumatic.     Right Ear: Tympanic membrane, ear canal and external ear normal.     Left Ear:  Tympanic membrane, ear canal and external ear normal.     Nose: Nose normal. No congestion.     Mouth/Throat:     Mouth: Mucous membranes are moist.     Pharynx: Oropharynx is clear. No posterior oropharyngeal erythema.  Eyes:     General: No scleral icterus.    Extraocular Movements: Extraocular movements intact.     Conjunctiva/sclera: Conjunctivae normal.     Pupils: Pupils are equal, round, and reactive to light.  Neck:     Thyroid: No thyromegaly.     Vascular: No carotid bruit or JVD.  Cardiovascular:     Rate and Rhythm: Normal rate and regular rhythm.     Pulses: Normal pulses.     Heart sounds: Normal heart sounds. No gallop.   Pulmonary:     Effort: Pulmonary effort is normal. No respiratory distress.     Breath sounds: Normal breath sounds. No wheezing.     Comments: Good air exch Chest:     Chest wall: No tenderness.  Abdominal:     General: Bowel sounds are normal. There is no distension or abdominal bruit.     Palpations: Abdomen is soft. There is no mass.     Tenderness: There is no abdominal tenderness.     Hernia: No hernia is present.  Genitourinary:    Comments: Breast exam: No mass, nodules, thickening, tenderness, bulging, retraction, inflamation, nipple discharge or skin changes noted.  No axillary or clavicular LA.     Baseline surgical changes on R Musculoskeletal:        General: No tenderness. Normal range of motion.     Cervical back: Normal range of motion and neck supple. No rigidity. No muscular tenderness.     Right lower leg: No edema.     Left lower leg: No edema.  Lymphadenopathy:     Cervical: No cervical adenopathy.  Skin:    General: Skin is warm and dry.     Coloration: Skin is not  pale.     Findings: No erythema or rash.     Comments: Solar lentigines diffusely Some sks  Neurological:     Mental Status: She is alert. Mental status is at baseline.     Cranial Nerves: No cranial nerve deficit.     Motor: No abnormal muscle tone.      Coordination: Coordination normal.     Gait: Gait normal.     Deep Tendon Reflexes: Reflexes are normal and symmetric. Reflexes normal.  Psychiatric:        Mood and Affect: Mood normal.        Cognition and Memory: Cognition and memory normal.           Assessment & Plan:   Problem List Items Addressed This Visit      Cardiovascular and Mediastinum   Essential hypertension    bp in fair control at this time  BP Readings from Last 1 Encounters:  05/11/20 132/88   No changes needed Plan to continues hctz 25 mg daily  Most recent labs reviewed  Disc lifstyle change with low sodium diet and exercise        Relevant Medications   hydrochlorothiazide (HYDRODIURIL) 25 MG tablet     Endocrine   Hypothyroid    Lab Results  Component Value Date   TSH 7.89 (H) 05/03/2020    Starting levothyroxine 25 mcg daily -explained how to take correctly Some fatigue  Re check TSH in about 6 weeks       Relevant Medications   levothyroxine (SYNTHROID) 25 MCG tablet   Other Relevant Orders   TSH     Other   Vitamin B12 deficiency    Lab Results  Component Value Date   VITAMINB12 280 05/03/2020   Taking 1000 mcg daily oral  May need shots in the future Bariatric surgery status       Vitamin D deficiency    Vit D level 84.5 Vitamin D level is therapeutic with current supplementation Disc importance of this to bone and overall health       HYPERCHOLESTEROLEMIA    Disc goals for lipids and reasons to control them Rev last labs with pt Rev low sat fat diet in detail  LDL is down to 136 Intolerant of all statins Good HDL      Relevant Medications   hydrochlorothiazide (HYDRODIURIL) 25 MG tablet   Class 2 severe obesity due to excess calories with serious comorbidity and body mass index (BMI) of 38.0 to 38.9 in adult [facility])    Discussed how this problem influences overall health and the risks it imposes  Reviewed plan for weight loss with lower calorie diet (via better  food choices and also portion control or program like weight watchers) and exercise building up to or more than 30 minutes 5 days per week including some aerobic activity   Enc her to continue pedaling Very little time for self care Past h/o bariatric surgery      Colon cancer screening    cologuard test negative 6/21      Estrogen deficiency    dexa order done No falls or fractures D level is therapeutic      Relevant Orders   DG Bone Density   Prediabetes    Lab Results  Component Value Date   HGBA1C 6.2 05/03/2020   Stable  disc imp of low glycemic diet and wt loss to prevent DM2       Routine general medical examination at a  health care facility - Primary    Reviewed health habits including diet and exercise and skin cancer prevention Reviewed appropriate screening tests for age  Also reviewed health mt list, fam hx and immunization status , as well as social and family history   See HPI amw rev Labs rev  Flu shot given  Discussed shingrix  covid immunized and planning booster next wk utd mammogram in setting of past breast cancer cologuard neg 6/21 dexa ordered       History of breast cancer    Tamoxifen in the past  Doing well  dexa ordered       Other Visit Diagnoses    Need for immunization against influenza       Relevant Orders   Flu Vaccine QUAD High Dose(Fluad) (Completed)

## 2020-05-11 NOTE — Patient Instructions (Addendum)
Call to schedule your bone density test Get your mammogram as planned   If you are interested in the new shingles vaccine (Shingrix) - call your local pharmacy to check on coverage and availability  If affordable, get on a wait list at your pharmacy to get the vaccine.  Avoid red meat/ fried foods/ egg yolks/ fatty breakfast meats/ butter, cheese and high fat dairy/ and shellfish    To avoid diabetes  Try to get most of your carbohydrates from produce (with the exception of white potatoes)  Eat less bread/pasta/rice/snack foods/cereals/sweets and other items from the middle of the grocery store (processed carbs)  Start levothyroxine 25 mcg In am 30 min before meds/vitamins or food  We will check your TSH again in about 6 weeks

## 2020-05-11 NOTE — Assessment & Plan Note (Signed)
dexa order done No falls or fractures D level is therapeutic

## 2020-05-11 NOTE — Assessment & Plan Note (Signed)
cologuard test negative 6/21

## 2020-05-11 NOTE — Assessment & Plan Note (Signed)
Tamoxifen in the past  Doing well  dexa ordered

## 2020-05-11 NOTE — Assessment & Plan Note (Signed)
Discussed how this problem influences overall health and the risks it imposes  Reviewed plan for weight loss with lower calorie diet (via better food choices and also portion control or program like weight watchers) and exercise building up to or more than 30 minutes 5 days per week including some aerobic activity   Enc her to continue pedaling Very little time for self care Past h/o bariatric surgery

## 2020-05-11 NOTE — Assessment & Plan Note (Signed)
Lab Results  Component Value Date   HGBA1C 6.2 05/03/2020   Stable  disc imp of low glycemic diet and wt loss to prevent DM2

## 2020-05-11 NOTE — Assessment & Plan Note (Signed)
Lab Results  Component Value Date   VITAMINB12 280 05/03/2020   Taking 1000 mcg daily oral  May need shots in the future Bariatric surgery status

## 2020-05-11 NOTE — Assessment & Plan Note (Signed)
Vit D level 84.5 Vitamin D level is therapeutic with current supplementation Disc importance of this to bone and overall health

## 2020-05-11 NOTE — Assessment & Plan Note (Signed)
Lab Results  Component Value Date   TSH 7.89 (H) 05/03/2020    Starting levothyroxine 25 mcg daily -explained how to take correctly Some fatigue  Re check TSH in about 6 weeks

## 2020-05-13 ENCOUNTER — Other Ambulatory Visit: Payer: Self-pay | Admitting: Oncology

## 2020-05-13 DIAGNOSIS — Z853 Personal history of malignant neoplasm of breast: Secondary | ICD-10-CM

## 2020-06-03 ENCOUNTER — Ambulatory Visit
Admission: RE | Admit: 2020-06-03 | Discharge: 2020-06-03 | Disposition: A | Discharge status: Home / Self Care | Payer: Medicare Other | Source: Ambulatory Visit | Attending: Oncology | Admitting: Oncology

## 2020-06-03 ENCOUNTER — Other Ambulatory Visit: Payer: Self-pay

## 2020-06-03 DIAGNOSIS — Z853 Personal history of malignant neoplasm of breast: Secondary | ICD-10-CM

## 2020-06-03 DIAGNOSIS — R922 Inconclusive mammogram: Secondary | ICD-10-CM | POA: Diagnosis not present

## 2020-06-07 NOTE — Progress Notes (Signed)
[facility]  [phone] [phone]     ID: Jennifer Bartlett DOB: [birth year]  MR#: [id number]  JYN#:[id number]  Bartlett Care Team: Judy Pimple, MD as PCP - General Young, Davonna Belling, NP (Inactive) as Nurse Practitioner (Obstetrics and Gynecology) Almond Lint, MD as Consulting Physician (General Surgery) Josslynn Mentzer, Valentino Hue, MD as Consulting Physician (Oncology) Pershing Proud, RN as Registered Nurse Donnelly Angelica, RN as Registered Nurse Damita Lack, Marcy Salvo, NP as Nurse Practitioner (Nurse Practitioner) Dannielle Huh, MD as Consulting Physician (Orthopedic Surgery) OTHER MD:  CHIEF COMPLAINT: Estrogen receptor positive breast cancer  CURRENT TREATMENT: Completing 5 years of tamoxifen   INTERVAL HISTORY: Jennifer Bartlett returns today for follow-up of Jennifer Bartlett estrogen receptor positive breast cancer.  This is Jennifer Bartlett "graduation" visit.  Jennifer Bartlett continues on tamoxifen, generally with good tolerance.  Jennifer Bartlett does have hot flashes and Jennifer Bartlett is helped by gabapentin at bedtime.  Currently Jennifer Bartlett is taking 600 mg daily.  Jennifer Bartlett is not having problems with vaginal wetness or discharge  Since Jennifer Bartlett last visit, Jennifer Bartlett underwent bilateral diagnostic mammography with tomography at [facility] on 06/03/2020 showing: breast density category C; no evidence of malignancy in either breast.  Jennifer Bartlett is scheduled for bone density screening on 08/10/2020.   REVIEW OF SYSTEMS: Jennifer Bartlett is generally doing well, taking little walks, and has gotten herself some exercise equipment at home which Jennifer Bartlett is enjoying.  A detailed review of systems was otherwise stable except as noted below in Jennifer plan section   COVID 19 VACCINATION STATUS: Received Pfizer x2, most recently March 2021   BREAST CANCER HISTORY:  From Jennifer original intake note:  Jennifer Bartlett underwent bilateral screening mammography with tomography at [facility] July 2016. This showed a possible mass in Jennifer right breast. Jennifer Bartlett was recalled  for diagnostic right mammography with tomosynthesis and right breast ultrasonography 02/17/2015. This found Jennifer breast density to be category C. In Jennifer right breast upper inner quadrant there was a persistent poorly marginated nodule which was not palpable. By ultrasound this measured 0.5 cm. There was no evidence of right axillary lymphadenopathy.  Biopsy of this mass 02/24/2015 showed ([id number]) an invasive lobular breast cancer, E-cadherin negative, grade 1, estrogen receptor 95% positive, progesterone receptor 60% positive, both with strong staining intensity, with an MIB-1 of 3%, and no Jennifer Bartlett-2 amplification, Jennifer signals ratio being 1.29 and Jennifer number per cell 2.00.  Jennifer Bartlett subsequent history is as detailed below   PAST MEDICAL HISTORY: Past Medical History:  Diagnosis Date  . Anemia   . Anxiety   . Breast cancer (HCC)   . 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  . GERD (gastroesophageal reflux disease)   . Hot flashes   . Hyperlipidemia   . Hypertension   . Hypoglycemia   . Obesity   . Personal history of radiation therapy   . Vitamin B 12 deficiency     PAST SURGICAL HISTORY: Past Surgical History:  Procedure Laterality Date  . ABDOMINAL HYSTERECTOMY    . BREAST BIOPSY    . BREAST LUMPECTOMY Right    2016  . GASTRIC BYPASS  08/2003  . HERNIA REPAIR    . KNEE SURGERY     left  . PARTIAL HYSTERECTOMY  1990's   bleeding  . RADIOACTIVE SEED GUIDED PARTIAL MASTECTOMY WITH AXILLARY SENTINEL LYMPH NODE BIOPSY Right 03/09/2015   Procedure: RADIOACTIVE SEED GUIDED RIGHT PARTIAL MASTECTOMY WITH AXILLARY SENTINEL LYMPH NODE BIOPSY;  Surgeon: Almond Lint,  MD;  Location: [facility];  Service: General;  Laterality: Right;  . TUBAL LIGATION      FAMILY HISTORY Family History  Problem Relation Age of Onset  . Heart disease Mother        MI  . Hypertension Mother   . Alcohol abuse Father   . Heart disease Father        smoker    . Hypertension Father   . Breast cancer Paternal Grandmother   . Diabetes Maternal Aunt    Jennifer Bartlett's father died at Jennifer age of 47 and Jennifer Bartlett mother at Jennifer age of 9, both from heart attacks in Jennifer setting of tobacco abuse. Jennifer Bartlett has 3 brothers, one sister. Jennifer Bartlett's paternal grandmother may have been diagnosed with breast cancer in Jennifer Bartlett late 72s, but that is not certain. There is no other history of breast or ovarian cancer in Jennifer family to Jennifer Bartlett's knowledge.   GYNECOLOGIC HISTORY:  Bartlett's last menstrual period was 06/08/1989. Menarche age 80, first live birth age 72; Jennifer Bartlett is GX P2. Jennifer Bartlett underwent hysterectomy without salpingo-oophorectomy more than 20 years ago. Jennifer Bartlett has been on hormone replacement more than 20 years. Jennifer Bartlett understands this does increase Jennifer risk of developing breast cancer. This was stopped July 2016.   SOCIAL HISTORY: (Updated 06/01/17) Jennifer Bartlett used to work for [employer] but is now retired. Jennifer Bartlett husband Tiya Bruner (goes by [name]) worked 49 years for [employer]. Jennifer Bartlett's daughter Leonel Ramsay lives in [place] and is Interior and spatial designer of Jennifer afterschool program in [place]. Jennifer Bartlett's son [name] lives in [place] and works for [employer], a company that cleans up after disaster or certain crimes. Jennifer Bartlett also has an adopted daughter with knee Clinton Sawyer who lives in [place] and is a homemaker. Jennifer Bartlett has 4 blood grandchildren and 2 adopted grandchildren. At home its Jennifer Bartlett and Jennifer Bartlett husband, Jennifer Bartlett daughter and Jennifer Bartlett husband and 2 grandchildren. Annaelle attends a [employer]    ADVANCED DIRECTIVES: In Jennifer absence of any documentation to Jennifer contrary, Jennifer Bartlett's spouse is their HCPOA.    HEALTH MAINTENANCE: Social History   Tobacco Use  . Smoking status: Never Smoker  . Smokeless tobacco: Never Used  Substance Use Topics  . Alcohol use: No  . Drug use: No     Colonoscopy: Never (status post bypass  surgery)  PAP: 2015  Bone density: c. 2012; normal  Lipid panel:  Allergies  Allergen Reactions  . Crestor [Rosuvastatin Calcium] Other (See Comments)    Myalgia   . Lipitor [Atorvastatin Calcium] Other (See Comments)    Aches and pains  . Penicillins Shortness Of Breath and Swelling  . Zocor [Simvastatin - High Dose] Other (See Comments)    Muscle/joint aches    Current Outpatient Medications  Medication Sig Dispense Refill  . BIOTIN PO Take 1 tablet by mouth daily.    Marland Kitchen CALCIUM PO Take by mouth daily.      . Cholecalciferol (D3 VITAMIN PO) Take 4,000 Units by mouth daily.    Marland Kitchen gabapentin (NEURONTIN) 300 MG capsule Take 2 capsules (600 mg total) by mouth at bedtime. 180 capsule 4  . hydrochlorothiazide (HYDRODIURIL) 25 MG tablet Take 1 tablet (25 mg total) by mouth daily. 90 tablet 3  . ketoconazole (NIZORAL) 2 % cream Apply 1 application topically daily. 15 g 0  . levothyroxine (SYNTHROID) 25 MCG tablet Take 1 tablet (25 mcg total) by mouth daily before breakfast. 30 tablet  3  . loratadine (CLARITIN) 10 MG tablet Take 10 mg by mouth daily.    . Multiple Vitamin (MULTIVITAMIN) capsule Take 1 capsule by mouth daily.      Marland Kitchen omeprazole (PRILOSEC) 20 MG capsule Take 1 capsule (20 mg total) by mouth daily. 90 capsule 3  . venlafaxine XR (EFFEXOR-XR) 150 MG 24 hr capsule Take 1 capsule (150 mg total) by mouth daily with breakfast. 90 capsule 4   No current facility-administered medications for this visit.    OBJECTIVE: White woman in no acute distress  Vitals:   06/08/20 0900  BP: (!) 159/66  Pulse: 76  Resp: 18  Temp: 97.6 F (36.4 C)  SpO2: 95%     Body mass index is 37.65 kg/m.    ECOG FS:1 - Symptomatic but completely ambulatory  Sclerae unicteric, EOMs intact Wearing a mask No cervical or supraclavicular adenopathy Lungs no rales or rhonchi Heart regular rate and rhythm Abd soft, nontender, positive bowel sounds MSK no focal spinal tenderness, no upper extremity  lymphedema Neuro: nonfocal, well oriented, appropriate affect Breasts: Jennifer right breast is status post lumpectomy and radiation.  There is no evidence of disease recurrence.  Jennifer left breast is benign.  Both axillae are benign.   LAB RESULTS:  CMP     Component Value Date/Time   NA 139 05/03/2020 0828   NA 141 06/01/2017 1145   K 3.8 05/03/2020 0828   K 4.0 06/01/2017 1145   CL 99 05/03/2020 0828   CO2 31 05/03/2020 0828   CO2 30 (H) 06/01/2017 1145   GLUCOSE 119 (H) 05/03/2020 0828   GLUCOSE 65 (L) 06/01/2017 1145   BUN 23 05/03/2020 0828   BUN 15.8 06/01/2017 1145   CREATININE 1.02 05/03/2020 0828   CREATININE 0.87 06/03/2018 0923   CREATININE 0.8 06/01/2017 1145   CALCIUM 8.7 05/03/2020 0828   CALCIUM 9.1 06/01/2017 1145   PROT 6.9 05/03/2020 0828   PROT 7.3 06/01/2017 1145   ALBUMIN 4.0 05/03/2020 0828   ALBUMIN 3.6 06/01/2017 1145   AST 13 05/03/2020 0828   AST 17 06/03/2018 0923   AST 19 06/01/2017 1145   ALT 10 05/03/2020 0828   ALT 13 06/03/2018 0923   ALT 15 06/01/2017 1145   ALKPHOS 55 05/03/2020 0828   ALKPHOS 59 06/01/2017 1145   BILITOT 0.4 05/03/2020 0828   BILITOT 0.3 06/03/2018 0923   BILITOT 0.28 06/01/2017 1145   GFRNONAA 56 (L) 06/09/2019 0839   GFRNONAA >60 06/03/2018 0923   GFRAA >60 06/09/2019 0839   GFRAA >60 06/03/2018 0923    INo results found for: SPEP, UPEP  Lab Results  Component Value Date   WBC 6.5 06/08/2020   NEUTROABS 3.2 06/08/2020   HGB 10.9 (L) 06/08/2020   HCT 34.4 (L) 06/08/2020   MCV 88.2 06/08/2020   PLT 276 06/08/2020      Chemistry      Component Value Date/Time   NA 139 05/03/2020 0828   NA 141 06/01/2017 1145   K 3.8 05/03/2020 0828   K 4.0 06/01/2017 1145   CL 99 05/03/2020 0828   CO2 31 05/03/2020 0828   CO2 30 (H) 06/01/2017 1145   BUN 23 05/03/2020 0828   BUN 15.8 06/01/2017 1145   CREATININE 1.02 05/03/2020 0828   CREATININE 0.87 06/03/2018 0923   CREATININE 0.8 06/01/2017 1145      Component  Value Date/Time   CALCIUM 8.7 05/03/2020 0828   CALCIUM 9.1 06/01/2017 1145   ALKPHOS 55  05/03/2020 0828   ALKPHOS 59 06/01/2017 1145   AST 13 05/03/2020 0828   AST 17 06/03/2018 0923   AST 19 06/01/2017 1145   ALT 10 05/03/2020 0828   ALT 13 06/03/2018 0923   ALT 15 06/01/2017 1145   BILITOT 0.4 05/03/2020 0828   BILITOT 0.3 06/03/2018 0923   BILITOT 0.28 06/01/2017 1145       No results found for: LABCA2  No components found for: LABCA125  No results for input(s): INR in Jennifer last 168 hours.  Urinalysis No results found for: COLORURINE, APPEARANCEUR, LABSPEC, PHURINE, GLUCOSEU, HGBUR, BILIRUBINUR, KETONESUR, PROTEINUR, UROBILINOGEN, NITRITE, LEUKOCYTESUR   STUDIES: MM DIAG BREAST TOMO BILATERAL  Result Date: 06/03/2020 CLINICAL DATA:  74 year old female presenting for routine annual surveillance status post right breast lumpectomy in 2016. EXAM: DIGITAL DIAGNOSTIC BILATERAL MAMMOGRAM WITH TOMO AND CAD COMPARISON:  Previous exam(s). ACR Breast Density Category c: Jennifer breast tissue is heterogeneously dense, which may obscure small masses. FINDINGS: Jennifer right breast lumpectomy site is stable. No suspicious calcifications, masses or areas of distortion are seen in Jennifer bilateral breasts. Mammographic images were processed with CAD. IMPRESSION: Stable right breast lumpectomy site. No mammographic evidence of malignancy in Jennifer bilateral breasts. RECOMMENDATION: Screening mammogram in one year.(Code:[id number]-B-01Y) I have discussed Jennifer findings and recommendations with Jennifer Bartlett. If applicable, a reminder letter will be sent to Jennifer Bartlett regarding Jennifer next appointment. BI-RADS CATEGORY  2: Benign. Electronically Signed   By: Frederico Hamman M.D.   On: 06/03/2020 11:40    ASSESSMENT: 74 y.o. [place], [place] woman status post right breast Upper inner quadrant biopsy 02/24/2015 for a clinical T1a N0, stage IA invasive lobular carcinoma, grade 1, estrogen and progesterone receptor strongly  positive, Jennifer Bartlett-2 not amplified, with an MIB-1 of 3%  (1)  Right lumpectomy and sentinel lymph node sampling 03/09/2015 showed a pT1b pN0, stage IA  Invasive lobular carcinoma, grade 2 , repeat Jennifer Bartlett-2 again negative  (2) adjuvant radiation 04/07/2015 through 05/13/2015: Right breast 4860 cGy in 27 sessions, no boost    (3)  started tamoxifen in January 2017, completing 5 years January 2022  (a)  Status post hysterectomy without salpingo-oophorectomy remotely   PLAN: Syrita will complete 5 years of tamoxifen in January 2022.  Jennifer Bartlett is now ready to "graduate" from follow-up here.  Jennifer Bartlett has had problems with inframammary yeast.  I think Jennifer Bartlett would benefit from ketoconazole cream and I have written a prescription for that.  Jennifer Bartlett would like a referral to plastic surgery to consider left reduction mammoplasty.  I have placed that referral for Jennifer Bartlett.  If Jennifer Bartlett does not get a call within a week or 2 Jennifer Bartlett will let me know and I will place it again.  We have given Jennifer Bartlett gabapentin and venlafaxine to help with hot flashes from Jennifer tamoxifen.  I have refilled both of these however when Jennifer refills and in about a year if Jennifer Bartlett wishes to continue Jennifer Bartlett will have to obtain those refills from Jennifer Bartlett primary care physician.  At this point I am comfortable releasing Jennifer Bartlett to Jennifer Bartlett primary care physician.  All Vivienne will need in terms of breast cancer follow-up is Jennifer Bartlett yearly mammography and a yearly physician breast exam  I will be glad to see Jennifer Bartlett again at any point in Jennifer future if and when Jennifer need arises but as of now we are making no further appointments for Jennifer Bartlett here  Total encounter time 35 minutes.  Ura Yingling, Valentino Hue, MD  06/08/20 9:14 AM Medical  Oncology and Hematology [facility] [address] [place], [place] [address] Tel. [phone]    Fax. [phone]    I, Mickie Bail, am acting as scribe for Dr. Valentino Hue. Tikita Mabee.  I, Ruthann Cancer MD, have reviewed Jennifer above  documentation for accuracy and completeness, and I agree with Jennifer above.   *Total Encounter Time as defined by Jennifer Centers for Medicare and Medicaid Services includes, in addition to Jennifer face-to-face time of a Bartlett visit (documented in Jennifer note above) non-face-to-face time: obtaining and reviewing outside history, ordering and reviewing medications, tests or procedures, care coordination (communications with other health care professionals or caregivers) and documentation in Jennifer medical record.

## 2020-06-08 ENCOUNTER — Inpatient Hospital Stay: Payer: Medicare Other | Admitting: Oncology

## 2020-06-08 ENCOUNTER — Encounter: Payer: Self-pay | Admitting: Oncology

## 2020-06-08 ENCOUNTER — Inpatient Hospital Stay: Payer: Medicare Other | Attending: Oncology

## 2020-06-08 ENCOUNTER — Other Ambulatory Visit: Payer: Self-pay

## 2020-06-08 VITALS — BP 159/66 | HR 76 | Temp 97.6°F | Resp 18 | Ht 63.5 in | Wt 215.9 lb

## 2020-06-08 DIAGNOSIS — E538 Deficiency of other specified B group vitamins: Secondary | ICD-10-CM | POA: Diagnosis not present

## 2020-06-08 DIAGNOSIS — E785 Hyperlipidemia, unspecified: Secondary | ICD-10-CM | POA: Insufficient documentation

## 2020-06-08 DIAGNOSIS — Z170 Estrogen receptor positive status [ER+]: Secondary | ICD-10-CM | POA: Diagnosis not present

## 2020-06-08 DIAGNOSIS — Z79899 Other long term (current) drug therapy: Secondary | ICD-10-CM | POA: Diagnosis not present

## 2020-06-08 DIAGNOSIS — Z79810 Long term (current) use of selective estrogen receptor modulators (SERMs): Secondary | ICD-10-CM | POA: Diagnosis not present

## 2020-06-08 DIAGNOSIS — R232 Flushing: Secondary | ICD-10-CM | POA: Diagnosis not present

## 2020-06-08 DIAGNOSIS — C50211 Malignant neoplasm of upper-inner quadrant of right female breast: Secondary | ICD-10-CM

## 2020-06-08 DIAGNOSIS — I10 Essential (primary) hypertension: Secondary | ICD-10-CM | POA: Insufficient documentation

## 2020-06-08 DIAGNOSIS — Z6837 Body mass index (BMI) 37.0-37.9, adult: Secondary | ICD-10-CM | POA: Insufficient documentation

## 2020-06-08 DIAGNOSIS — E669 Obesity, unspecified: Secondary | ICD-10-CM | POA: Insufficient documentation

## 2020-06-08 DIAGNOSIS — K219 Gastro-esophageal reflux disease without esophagitis: Secondary | ICD-10-CM | POA: Diagnosis not present

## 2020-06-08 DIAGNOSIS — Z923 Personal history of irradiation: Secondary | ICD-10-CM | POA: Diagnosis not present

## 2020-06-08 LAB — COMPREHENSIVE METABOLIC PANEL
ALT: 13 U/L (ref 0–44)
AST: 17 U/L (ref 15–41)
Albumin: 3.4 g/dL — ABNORMAL LOW (ref 3.5–5.0)
Alkaline Phosphatase: 60 U/L (ref 38–126)
Anion gap: 6 (ref 5–15)
BUN: 18 mg/dL (ref 8–23)
CO2: 30 mmol/L (ref 22–32)
Calcium: 8.6 mg/dL — ABNORMAL LOW (ref 8.9–10.3)
Chloride: 102 mmol/L (ref 98–111)
Creatinine, Ser: 0.92 mg/dL (ref 0.44–1.00)
GFR, Estimated: >60 mL/min (ref >60)
Glucose, Bld: 104 mg/dL — ABNORMAL HIGH (ref 70–99)
Potassium: 3.3 mmol/L — ABNORMAL LOW (ref 3.5–5.1)
Sodium: 138 mmol/L (ref 135–145)
Total Bilirubin: 0.4 mg/dL (ref 0.3–1.2)
Total Protein: 6.9 g/dL (ref 6.5–8.1)

## 2020-06-08 LAB — CBC WITH DIFFERENTIAL/PLATELET
Abs Immature Granulocytes: 0.02 10*3/uL (ref 0.00–0.07)
Basophils Absolute: 0.1 10*3/uL (ref 0.0–0.1)
Basophils Relative: 1 %
Eosinophils Absolute: 0.3 10*3/uL (ref 0.0–0.5)
Eosinophils Relative: 5 %
HCT: 34.4 % — ABNORMAL LOW (ref 36.0–46.0)
Hemoglobin: 10.9 g/dL — ABNORMAL LOW (ref 12.0–15.0)
Immature Granulocytes: 0 %
Lymphocytes Relative: 36 %
Lymphs Abs: 2.3 10*3/uL (ref 0.7–4.0)
MCH: 27.9 pg (ref 26.0–34.0)
MCHC: 31.7 g/dL (ref 30.0–36.0)
MCV: 88.2 fL (ref 80.0–100.0)
Monocytes Absolute: 0.6 10*3/uL (ref 0.1–1.0)
Monocytes Relative: 9 %
Neutro Abs: 3.2 10*3/uL (ref 1.7–7.7)
Neutrophils Relative %: 49 %
Platelets: 276 10*3/uL (ref 150–400)
RBC: 3.9 MIL/uL (ref 3.87–5.11)
RDW: 14.6 % (ref 11.5–15.5)
WBC: 6.5 10*3/uL (ref 4.0–10.5)
nRBC: 0 % (ref 0.0–0.2)

## 2020-06-08 MED ORDER — VENLAFAXINE HCL ER 150 MG PO CP24
150.0000 mg | ORAL_CAPSULE | Freq: Every day | ORAL | 4 refills | Status: DC
Start: 2020-06-08 — End: 2021-06-01

## 2020-06-08 MED ORDER — KETOCONAZOLE 2 % EX CREA: 1.0000 "application " | TOPICAL_CREAM | Freq: Every day | CUTANEOUS | 0 refills | Status: DC

## 2020-06-08 MED ORDER — GABAPENTIN 300 MG PO CAPS
600.0000 mg | ORAL_CAPSULE | Freq: Every day | ORAL | 4 refills | Status: DC
Start: 2020-06-08 — End: 2021-06-01

## 2020-06-10 ENCOUNTER — Telehealth: Payer: Self-pay | Admitting: Oncology

## 2020-06-10 NOTE — Telephone Encounter (Signed)
No 11/9 los, no changes made to pt schedule  

## 2020-07-19 DIAGNOSIS — N6489 Other specified disorders of breast: Secondary | ICD-10-CM | POA: Diagnosis not present

## 2020-07-19 DIAGNOSIS — L304 Erythema intertrigo: Secondary | ICD-10-CM | POA: Diagnosis not present

## 2020-07-19 DIAGNOSIS — Z853 Personal history of malignant neoplasm of breast: Secondary | ICD-10-CM | POA: Diagnosis not present

## 2020-07-19 DIAGNOSIS — Z923 Personal history of irradiation: Secondary | ICD-10-CM | POA: Diagnosis not present

## 2020-08-05 ENCOUNTER — Other Ambulatory Visit: Payer: Medicare Other

## 2020-08-10 ENCOUNTER — Other Ambulatory Visit: Payer: Medicare Other

## 2020-08-11 ENCOUNTER — Other Ambulatory Visit: Payer: Self-pay | Admitting: Family Medicine

## 2020-08-11 ENCOUNTER — Other Ambulatory Visit: Payer: Medicare Other

## 2020-08-11 DIAGNOSIS — E2839 Other primary ovarian failure: Secondary | ICD-10-CM

## 2020-08-27 ENCOUNTER — Other Ambulatory Visit: Payer: Self-pay | Admitting: *Deleted

## 2020-08-27 NOTE — Telephone Encounter (Signed)
See labs from 05/03/20, pt never came back for f/u lab appt to recheck TSH, please advise

## 2020-08-28 NOTE — Telephone Encounter (Signed)
Please refill once and re schedule lab check

## 2020-08-30 ENCOUNTER — Other Ambulatory Visit: Payer: Self-pay | Admitting: Oncology

## 2020-08-30 MED ORDER — LEVOTHYROXINE SODIUM 25 MCG PO TABS
25.0000 ug | ORAL_TABLET | Freq: Every day | ORAL | 0 refills | Status: DC
Start: 2020-08-30 — End: 2020-10-14

## 2020-08-30 NOTE — Telephone Encounter (Signed)
Left message for patient to call back to be scheduled for lab appointment. Refilled medication x 1.

## 2020-08-31 HISTORY — PX: REDUCTION MAMMAPLASTY: SUR839

## 2020-09-02 ENCOUNTER — Other Ambulatory Visit: Payer: Self-pay

## 2020-09-02 ENCOUNTER — Other Ambulatory Visit (INDEPENDENT_AMBULATORY_CARE_PROVIDER_SITE_OTHER): Payer: Medicare Other

## 2020-09-02 DIAGNOSIS — E039 Hypothyroidism, unspecified: Secondary | ICD-10-CM

## 2020-09-02 LAB — TSH: TSH: 4.41 u[IU]/mL (ref 0.35–4.50)

## 2020-09-06 DIAGNOSIS — N6489 Other specified disorders of breast: Secondary | ICD-10-CM | POA: Diagnosis not present

## 2020-09-06 DIAGNOSIS — Z853 Personal history of malignant neoplasm of breast: Secondary | ICD-10-CM | POA: Diagnosis not present

## 2020-09-06 DIAGNOSIS — Z923 Personal history of irradiation: Secondary | ICD-10-CM | POA: Diagnosis not present

## 2020-09-06 NOTE — H&P (Signed)
  Subjective:     Patient ID: Jennifer Bartlett is a 75 y.o. female.  HPI  Here for follow up discussion prior to planned breast reduction. Diagnosed with right breast ca 2016 and underwent right lumpectomy SLN with Dr. Barry Dienes for final pT1b pN0, stage IA ILC, Er/PR+, Her2- . Completed adjuvant radiation. Completed 5 years of tamoxifen Jan 2022. Notes significant asymmetry breasts post treatment with left larger. Notes frequent rashes beneath breasts, on topical antifungal/steroid cream without improvement.  MMG 06/03/2020 normal  Current 42 D  Highest wt 300 lb. Underwent gastric bypass 2005. Lowest weight 180 lb. Notes 15 lb weight gain over last year.  Retired from [employer]. Lives with spouse. Daughter, who is a CNA, and her family including 4 children ages 82-11 live upstairs from them  Review of Systems  Musculoskeletal: Positive for arthralgias.  Skin: Positive for rash.  Remainder 12 point review negative    Objective:   Physical Exam Cardiovascular:     Rate and Rhythm: Normal rate and regular rhythm.     Heart sounds: Normal heart sounds.  Pulmonary:     Effort: Pulmonary effort is normal.     Breath sounds: Normal breath sounds.  Chest:   Lymphadenopathy:     Upper Body:     Right upper body: No axillary adenopathy.     Left upper body: No axillary adenopathy.  Skin:    Comments: Fitzpatrick 2    Breasts:  Grade 3 ptosis bilateral, left>right volume No masses palpable Curvilinear scars right UOQ and UIQ Sn to nipple R 29 L 33 cm BW R 24 L 24 cm Nipple to IMF R 10.5 L 11 cm Asymmetry NAC diameter, left larger     Assessment:     History right breast cancer UIQ s/p right lumpectomy, SLN Adjuvant radiation Post operative asymmetry breasts Intertrigo    Plan:     Plan bilateral breast reduction. Reviewedanchor type scars, OP surgery, drains, post operative visits and limitations, recovery, including work limitations. Diminished sensation  nipple and breast skin, risk of nipple loss, asymmetry, incidental carcinoma, changes with wt gain/loss, aging, unacceptable cosmetic appearance reviewed.  Reviewed increased risk complications in setting RT including wound healing problems. Reviewed though over time will have recurrent asymmetry as radiated breast will not develop as much ptosis as right breast with aging.  Additional risks including but not limited to bleeding infection seroma hematoma damage to adjacent structures need for additional procedures blood clots in legs or lungs reviewed.  Discussed risk COVID infectionthrough this elective surgery. Patient will receive COVID testing prior to surgery. Discussed even if patient receivesa negative test result, the tests in some cases may fail to detect the virus or patient maycontract COVID after the test.COVID 19 infectionbefore/during/aftersurgery may result in lead to a higher chance of complication and death.  Drain teaching completed. Rx for oxycodone given.

## 2020-09-07 ENCOUNTER — Encounter (HOSPITAL_BASED_OUTPATIENT_CLINIC_OR_DEPARTMENT_OTHER): Payer: Self-pay | Admitting: Plastic Surgery

## 2020-09-07 ENCOUNTER — Other Ambulatory Visit: Payer: Self-pay

## 2020-09-10 ENCOUNTER — Encounter (HOSPITAL_BASED_OUTPATIENT_CLINIC_OR_DEPARTMENT_OTHER)
Admission: RE | Admit: 2020-09-10 | Discharge: 2020-09-10 | Disposition: A | Discharge status: Home / Self Care | Payer: Medicare Other | Source: Ambulatory Visit | Attending: Plastic Surgery | Admitting: Plastic Surgery

## 2020-09-10 ENCOUNTER — Other Ambulatory Visit (HOSPITAL_COMMUNITY)
Admission: RE | Admit: 2020-09-10 | Discharge: 2020-09-10 | Disposition: A | Discharge status: Home / Self Care | Payer: Medicare Other | Source: Ambulatory Visit | Attending: Plastic Surgery | Admitting: Plastic Surgery

## 2020-09-10 DIAGNOSIS — Z20822 Contact with and (suspected) exposure to covid-19: Secondary | ICD-10-CM | POA: Diagnosis not present

## 2020-09-10 DIAGNOSIS — Z01812 Encounter for preprocedural laboratory examination: Secondary | ICD-10-CM | POA: Insufficient documentation

## 2020-09-10 DIAGNOSIS — N651 Disproportion of reconstructed breast: Secondary | ICD-10-CM | POA: Diagnosis not present

## 2020-09-10 DIAGNOSIS — Z923 Personal history of irradiation: Secondary | ICD-10-CM | POA: Diagnosis not present

## 2020-09-10 DIAGNOSIS — Z9884 Bariatric surgery status: Secondary | ICD-10-CM | POA: Diagnosis not present

## 2020-09-10 DIAGNOSIS — Z853 Personal history of malignant neoplasm of breast: Secondary | ICD-10-CM | POA: Diagnosis not present

## 2020-09-10 DIAGNOSIS — L304 Erythema intertrigo: Secondary | ICD-10-CM | POA: Diagnosis not present

## 2020-09-10 DIAGNOSIS — N6489 Other specified disorders of breast: Secondary | ICD-10-CM | POA: Diagnosis not present

## 2020-09-10 LAB — CBC WITH DIFFERENTIAL/PLATELET
Abs Immature Granulocytes: 0.02 10*3/uL (ref 0.00–0.07)
Basophils Absolute: 0.1 10*3/uL (ref 0.0–0.1)
Basophils Relative: 1 %
Eosinophils Absolute: 0.3 10*3/uL (ref 0.0–0.5)
Eosinophils Relative: 4 %
HCT: 36.5 % (ref 36.0–46.0)
Hemoglobin: 11.2 g/dL — ABNORMAL LOW (ref 12.0–15.0)
Immature Granulocytes: 0 %
Lymphocytes Relative: 35 %
Lymphs Abs: 3 10*3/uL (ref 0.7–4.0)
MCH: 26.5 pg (ref 26.0–34.0)
MCHC: 30.7 g/dL (ref 30.0–36.0)
MCV: 86.5 fL (ref 80.0–100.0)
Monocytes Absolute: 0.6 10*3/uL (ref 0.1–1.0)
Monocytes Relative: 7 %
Neutro Abs: 4.5 10*3/uL (ref 1.7–7.7)
Neutrophils Relative %: 53 %
Platelets: 344 10*3/uL (ref 150–400)
RBC: 4.22 MIL/uL (ref 3.87–5.11)
RDW: 15.8 % — ABNORMAL HIGH (ref 11.5–15.5)
WBC: 8.6 10*3/uL (ref 4.0–10.5)
nRBC: 0 % (ref 0.0–0.2)

## 2020-09-10 LAB — BASIC METABOLIC PANEL
Anion gap: 11 (ref 5–15)
BUN: 16 mg/dL (ref 8–23)
CO2: 25 mmol/L (ref 22–32)
Calcium: 8.7 mg/dL — ABNORMAL LOW (ref 8.9–10.3)
Chloride: 104 mmol/L (ref 98–111)
Creatinine, Ser: 0.9 mg/dL (ref 0.44–1.00)
GFR, Estimated: >60 mL/min (ref >60)
Glucose, Bld: 125 mg/dL — ABNORMAL HIGH (ref 70–99)
Potassium: 3.8 mmol/L (ref 3.5–5.1)
Sodium: 140 mmol/L (ref 135–145)

## 2020-09-10 LAB — SARS CORONAVIRUS 2 (TAT 6-24 HRS): SARS Coronavirus 2: NEGATIVE

## 2020-09-10 NOTE — Progress Notes (Signed)

## 2020-09-13 NOTE — Anesthesia Preprocedure Evaluation (Addendum)
Anesthesia Evaluation  Patient identified by MRN, date of birth, ID band Patient awake    Reviewed: Allergy & Precautions, NPO status , Patient's Chart, lab work & pertinent test results  History of Anesthesia Complications Negative for: history of anesthetic complications  Airway Mallampati: II  TM Distance: >3 FB Neck ROM: Full    Dental  (+) Dental Advisory Given, Edentulous Upper, Partial Lower   Pulmonary neg pulmonary ROS,    Pulmonary exam normal breath sounds clear to auscultation       Cardiovascular hypertension, Pt. on medications (-) angina(-) Past MI Normal cardiovascular exam Rhythm:Regular Rate:Normal     Neuro/Psych PSYCHIATRIC DISORDERS Anxiety Depression negative neurological ROS     GI/Hepatic Neg liver ROS, GERD  Medicated and Controlled,  Endo/Other  Hypothyroidism Obesity   Renal/GU negative Renal ROS     Musculoskeletal  (+) Arthritis ,   Abdominal   Peds  Hematology  (+) Blood dyscrasia, anemia ,   Anesthesia Other Findings H/o breast cancer   Reproductive/Obstetrics                           Anesthesia Physical Anesthesia Plan  ASA: II  Anesthesia Plan: General   Post-op Pain Management:    Induction: Intravenous  PONV Risk Score and Plan: 3 and Dexamethasone, Ondansetron and Treatment may vary due to age or medical condition  Airway Management Planned: Oral ETT  Additional Equipment:   Intra-op Plan:   Post-operative Plan: Extubation in OR  Informed Consent: I have reviewed the patients History and Physical, chart, labs and discussed the procedure including the risks, benefits and alternatives for the proposed anesthesia with the patient or authorized representative who has indicated his/her understanding and acceptance.     Dental advisory given  Plan Discussed with: CRNA  Anesthesia Plan Comments:        Anesthesia Quick  Evaluation

## 2020-09-14 ENCOUNTER — Encounter (HOSPITAL_BASED_OUTPATIENT_CLINIC_OR_DEPARTMENT_OTHER)
Admission: RE | Disposition: A | Discharge status: Home / Self Care | Payer: Self-pay | Source: Home / Self Care | Attending: Plastic Surgery

## 2020-09-14 ENCOUNTER — Other Ambulatory Visit: Payer: Self-pay

## 2020-09-14 ENCOUNTER — Encounter (HOSPITAL_BASED_OUTPATIENT_CLINIC_OR_DEPARTMENT_OTHER): Payer: Self-pay | Admitting: Plastic Surgery

## 2020-09-14 ENCOUNTER — Ambulatory Visit (HOSPITAL_BASED_OUTPATIENT_CLINIC_OR_DEPARTMENT_OTHER): Payer: Medicare Other | Admitting: Anesthesiology

## 2020-09-14 ENCOUNTER — Ambulatory Visit (HOSPITAL_BASED_OUTPATIENT_CLINIC_OR_DEPARTMENT_OTHER)
Admission: RE | Admit: 2020-09-14 | Discharge: 2020-09-14 | Disposition: A | Discharge status: Home / Self Care | Payer: Medicare Other | Attending: Plastic Surgery | Admitting: Plastic Surgery

## 2020-09-14 DIAGNOSIS — Z853 Personal history of malignant neoplasm of breast: Secondary | ICD-10-CM | POA: Insufficient documentation

## 2020-09-14 DIAGNOSIS — N62 Hypertrophy of breast: Secondary | ICD-10-CM | POA: Diagnosis not present

## 2020-09-14 DIAGNOSIS — Z923 Personal history of irradiation: Secondary | ICD-10-CM | POA: Insufficient documentation

## 2020-09-14 DIAGNOSIS — N651 Disproportion of reconstructed breast: Secondary | ICD-10-CM | POA: Diagnosis not present

## 2020-09-14 DIAGNOSIS — Z9884 Bariatric surgery status: Secondary | ICD-10-CM | POA: Diagnosis not present

## 2020-09-14 DIAGNOSIS — N6011 Diffuse cystic mastopathy of right breast: Secondary | ICD-10-CM | POA: Diagnosis not present

## 2020-09-14 DIAGNOSIS — L304 Erythema intertrigo: Secondary | ICD-10-CM | POA: Diagnosis not present

## 2020-09-14 DIAGNOSIS — C50911 Malignant neoplasm of unspecified site of right female breast: Secondary | ICD-10-CM | POA: Diagnosis not present

## 2020-09-14 DIAGNOSIS — N6012 Diffuse cystic mastopathy of left breast: Secondary | ICD-10-CM | POA: Diagnosis not present

## 2020-09-14 DIAGNOSIS — N6489 Other specified disorders of breast: Secondary | ICD-10-CM | POA: Insufficient documentation

## 2020-09-14 HISTORY — DX: Unspecified osteoarthritis, unspecified site: M19.90

## 2020-09-14 HISTORY — PX: BREAST REDUCTION SURGERY: SHX8

## 2020-09-14 HISTORY — DX: Depression, unspecified: F32.A

## 2020-09-14 HISTORY — DX: Hypothyroidism, unspecified: E03.9

## 2020-09-14 SURGERY — MAMMOPLASTY, REDUCTION
Anesthesia: General | Site: Breast | Laterality: Bilateral

## 2020-09-14 MED ORDER — HEPARIN SODIUM (PORCINE) 5000 UNIT/ML IJ SOLN
5000.0000 [IU] | Freq: Once | INTRAMUSCULAR | Status: AC
  Administered 2020-09-14: 5000 [IU] via SUBCUTANEOUS

## 2020-09-14 MED ORDER — GABAPENTIN 300 MG PO CAPS
ORAL_CAPSULE | ORAL | Status: AC
  Filled 2020-09-14: qty 1

## 2020-09-14 MED ORDER — EPHEDRINE 5 MG/ML INJ
INTRAVENOUS | Status: AC
  Filled 2020-09-14: qty 10

## 2020-09-14 MED ORDER — CHLORHEXIDINE GLUCONATE CLOTH 2 % EX PADS: 6.0000 | MEDICATED_PAD | Freq: Once | CUTANEOUS | Status: DC

## 2020-09-14 MED ORDER — ONDANSETRON HCL 4 MG/2ML IJ SOLN: 4.0000 mg | Freq: Once | INTRAMUSCULAR | Status: DC | PRN

## 2020-09-14 MED ORDER — ACETAMINOPHEN 500 MG PO TABS
1000.0000 mg | ORAL_TABLET | Freq: Once | ORAL | Status: AC
  Administered 2020-09-14: 1000 mg via ORAL

## 2020-09-14 MED ORDER — ONDANSETRON HCL 4 MG/2ML IJ SOLN
INTRAMUSCULAR | Status: AC
  Filled 2020-09-14: qty 2

## 2020-09-14 MED ORDER — EPHEDRINE SULFATE 50 MG/ML IJ SOLN
INTRAMUSCULAR | Status: DC | PRN
  Administered 2020-09-14 (×3): 10 mg via INTRAVENOUS

## 2020-09-14 MED ORDER — DIPHENHYDRAMINE HCL 50 MG/ML IJ SOLN
INTRAMUSCULAR | Status: DC | PRN
  Administered 2020-09-14: 12.5 mg via INTRAVENOUS

## 2020-09-14 MED ORDER — PROPOFOL 10 MG/ML IV BOLUS
INTRAVENOUS | Status: DC | PRN
  Administered 2020-09-14: 150 mg via INTRAVENOUS

## 2020-09-14 MED ORDER — LIDOCAINE 2% (20 MG/ML) 5 ML SYRINGE
INTRAMUSCULAR | Status: AC
  Filled 2020-09-14: qty 5

## 2020-09-14 MED ORDER — DEXAMETHASONE SODIUM PHOSPHATE 10 MG/ML IJ SOLN
INTRAMUSCULAR | Status: AC
  Filled 2020-09-14: qty 1

## 2020-09-14 MED ORDER — ROCURONIUM BROMIDE 100 MG/10ML IV SOLN
INTRAVENOUS | Status: DC | PRN
  Administered 2020-09-14: 10 mg via INTRAVENOUS
  Administered 2020-09-14: 60 mg via INTRAVENOUS

## 2020-09-14 MED ORDER — BUPIVACAINE HCL (PF) 0.5 % IJ SOLN
INTRAMUSCULAR | Status: AC
  Filled 2020-09-14: qty 30

## 2020-09-14 MED ORDER — CLINDAMYCIN PHOSPHATE 900 MG/50ML IV SOLN
INTRAVENOUS | Status: AC
  Filled 2020-09-14: qty 50

## 2020-09-14 MED ORDER — GABAPENTIN 300 MG PO CAPS
300.0000 mg | ORAL_CAPSULE | ORAL | Status: AC
  Administered 2020-09-14: 300 mg via ORAL

## 2020-09-14 MED ORDER — ACETAMINOPHEN 500 MG PO TABS: 1000.0000 mg | ORAL_TABLET | ORAL | Status: AC

## 2020-09-14 MED ORDER — 0.9 % SODIUM CHLORIDE (POUR BTL) OPTIME
TOPICAL | Status: DC | PRN
  Administered 2020-09-14: 1000 mL

## 2020-09-14 MED ORDER — SUCCINYLCHOLINE CHLORIDE 200 MG/10ML IV SOSY
PREFILLED_SYRINGE | INTRAVENOUS | Status: AC
  Filled 2020-09-14: qty 10

## 2020-09-14 MED ORDER — CLINDAMYCIN PHOSPHATE 900 MG/50ML IV SOLN
900.0000 mg | INTRAVENOUS | Status: AC
  Administered 2020-09-14 (×2): 900 mg via INTRAVENOUS

## 2020-09-14 MED ORDER — DEXAMETHASONE SODIUM PHOSPHATE 4 MG/ML IJ SOLN
INTRAMUSCULAR | Status: DC | PRN
  Administered 2020-09-14: 10 mg via INTRAVENOUS

## 2020-09-14 MED ORDER — DIPHENHYDRAMINE HCL 50 MG/ML IJ SOLN
INTRAMUSCULAR | Status: AC
  Filled 2020-09-14: qty 1

## 2020-09-14 MED ORDER — BUPIVACAINE HCL (PF) 0.5 % IJ SOLN
INTRAMUSCULAR | Status: DC | PRN
  Administered 2020-09-14: 30 mL

## 2020-09-14 MED ORDER — FENTANYL CITRATE (PF) 100 MCG/2ML IJ SOLN
INTRAMUSCULAR | Status: AC
  Filled 2020-09-14: qty 2

## 2020-09-14 MED ORDER — FENTANYL CITRATE (PF) 100 MCG/2ML IJ SOLN
INTRAMUSCULAR | Status: DC | PRN
  Administered 2020-09-14 (×4): 50 ug via INTRAVENOUS

## 2020-09-14 MED ORDER — ACETAMINOPHEN 500 MG PO TABS
ORAL_TABLET | ORAL | Status: AC
  Filled 2020-09-14: qty 2

## 2020-09-14 MED ORDER — LACTATED RINGERS IV SOLN: INTRAVENOUS | Status: DC

## 2020-09-14 MED ORDER — MIDAZOLAM HCL 2 MG/2ML IJ SOLN
INTRAMUSCULAR | Status: AC
  Filled 2020-09-14: qty 2

## 2020-09-14 MED ORDER — HEPARIN SODIUM (PORCINE) 5000 UNIT/ML IJ SOLN
INTRAMUSCULAR | Status: AC
  Filled 2020-09-14: qty 1

## 2020-09-14 MED ORDER — LIDOCAINE HCL (CARDIAC) PF 100 MG/5ML IV SOSY
PREFILLED_SYRINGE | INTRAVENOUS | Status: DC | PRN
  Administered 2020-09-14 (×2): 100 mg via INTRAVENOUS

## 2020-09-14 MED ORDER — SUGAMMADEX SODIUM 200 MG/2ML IV SOLN
INTRAVENOUS | Status: DC | PRN
  Administered 2020-09-14: 200 mg via INTRAVENOUS

## 2020-09-14 MED ORDER — PHENYLEPHRINE 40 MCG/ML (10ML) SYRINGE FOR IV PUSH (FOR BLOOD PRESSURE SUPPORT)
PREFILLED_SYRINGE | INTRAVENOUS | Status: AC
  Filled 2020-09-14: qty 10

## 2020-09-14 MED ORDER — PROPOFOL 500 MG/50ML IV EMUL
INTRAVENOUS | Status: AC
  Filled 2020-09-14: qty 50

## 2020-09-14 MED ORDER — ROCURONIUM BROMIDE 10 MG/ML (PF) SYRINGE
PREFILLED_SYRINGE | INTRAVENOUS | Status: AC
  Filled 2020-09-14: qty 10

## 2020-09-14 MED ORDER — MIDAZOLAM HCL 5 MG/5ML IJ SOLN
INTRAMUSCULAR | Status: DC | PRN
  Administered 2020-09-14: 1 mg via INTRAVENOUS

## 2020-09-14 MED ORDER — FENTANYL CITRATE (PF) 100 MCG/2ML IJ SOLN: 25.0000 ug | INTRAMUSCULAR | Status: DC | PRN

## 2020-09-14 SURGICAL SUPPLY — 60 items
ADH SKN CLS APL DERMABOND .7 (GAUZE/BANDAGES/DRESSINGS) ×4
APL PRP STRL LF DISP 70% ISPRP (MISCELLANEOUS) ×2
APPLIER CLIP 9.375 MED OPEN (MISCELLANEOUS)
APR CLP MED 9.3 20 MLT OPN (MISCELLANEOUS)
BINDER BREAST 3XL (GAUZE/BANDAGES/DRESSINGS) IMPLANT
BINDER BREAST LRG (GAUZE/BANDAGES/DRESSINGS) IMPLANT
BINDER BREAST MEDIUM (GAUZE/BANDAGES/DRESSINGS) IMPLANT
BINDER BREAST XLRG (GAUZE/BANDAGES/DRESSINGS) IMPLANT
BINDER BREAST XXLRG (GAUZE/BANDAGES/DRESSINGS) ×2 IMPLANT
BLADE SURG 10 STRL SS (BLADE) ×12 IMPLANT
BNDG GAUZE ELAST 4 BULKY (GAUZE/BANDAGES/DRESSINGS) ×6 IMPLANT
CANISTER SUCT 1200ML W/VALVE (MISCELLANEOUS) ×3 IMPLANT
CHLORAPREP W/TINT 26 (MISCELLANEOUS) ×6 IMPLANT
CLIP APPLIE 9.375 MED OPEN (MISCELLANEOUS) IMPLANT
CLIP VESOCCLUDE MED 6/CT (CLIP) IMPLANT
COVER BACK TABLE 60X90IN (DRAPES) ×3 IMPLANT
COVER MAYO STAND STRL (DRAPES) ×3 IMPLANT
COVER WAND RF STERILE (DRAPES) IMPLANT
DERMABOND ADVANCED (GAUZE/BANDAGES/DRESSINGS) ×8
DERMABOND ADVANCED .7 DNX12 (GAUZE/BANDAGES/DRESSINGS) ×1 IMPLANT
DRAIN CHANNEL 15F RND FF W/TCR (WOUND CARE) ×6 IMPLANT
DRAPE TOP ARMCOVERS (MISCELLANEOUS) ×3 IMPLANT
DRAPE U-SHAPE 76X120 STRL (DRAPES) ×3 IMPLANT
DRAPE UTILITY XL STRL (DRAPES) ×5 IMPLANT
DRSG PAD ABDOMINAL 8X10 ST (GAUZE/BANDAGES/DRESSINGS) ×10 IMPLANT
ELECT COATED BLADE 2.86 ST (ELECTRODE) ×3 IMPLANT
ELECT REM PT RETURN 9FT ADLT (ELECTROSURGICAL) ×3
ELECTRODE REM PT RTRN 9FT ADLT (ELECTROSURGICAL) ×1 IMPLANT
EVACUATOR SILICONE 100CC (DRAIN) ×4 IMPLANT
GLOVE SURG ENC MOIS LTX SZ6 (GLOVE) ×2 IMPLANT
GLOVE SURG ENC MOIS LTX SZ7 (GLOVE) ×4 IMPLANT
GOWN STRL REUS W/ TWL LRG LVL3 (GOWN DISPOSABLE) ×2 IMPLANT
GOWN STRL REUS W/TWL LRG LVL3 (GOWN DISPOSABLE) ×6
MARKER SKIN DUAL TIP RULER LAB (MISCELLANEOUS) IMPLANT
NDL HYPO 25X1 1.5 SAFETY (NEEDLE) IMPLANT
NEEDLE HYPO 25X1 1.5 SAFETY (NEEDLE) ×3 IMPLANT
NS IRRIG 1000ML POUR BTL (IV SOLUTION) ×3 IMPLANT
PACK BASIN DAY SURGERY FS (CUSTOM PROCEDURE TRAY) ×3 IMPLANT
PENCIL SMOKE EVACUATOR (MISCELLANEOUS) ×3 IMPLANT
PIN SAFETY STERILE (MISCELLANEOUS) ×3 IMPLANT
SHEET MEDIUM DRAPE 40X70 STRL (DRAPES) ×6 IMPLANT
SLEEVE SCD COMPRESS KNEE MED (MISCELLANEOUS) ×3 IMPLANT
SLEEVE SURGEON STRL (DRAPES) ×3 IMPLANT
SPONGE LAP 18X18 RF (DISPOSABLE) ×10 IMPLANT
STAPLER VISISTAT 35W (STAPLE) ×5 IMPLANT
SUT ETHILON 2 0 FS 18 (SUTURE) ×3 IMPLANT
SUT MNCRL AB 4-0 PS2 18 (SUTURE) ×12 IMPLANT
SUT PDS AB 2-0 CT2 27 (SUTURE) IMPLANT
SUT VIC AB 3-0 PS1 18 (SUTURE) ×21
SUT VIC AB 3-0 PS1 18XBRD (SUTURE) ×4 IMPLANT
SUT VICRYL 4-0 PS2 18IN ABS (SUTURE) ×8 IMPLANT
SYR BULB IRRIG 60ML STRL (SYRINGE) ×3 IMPLANT
SYR CONTROL 10ML LL (SYRINGE) ×2 IMPLANT
TAPE MEASURE VINYL STERILE (MISCELLANEOUS) IMPLANT
TOWEL GREEN STERILE FF (TOWEL DISPOSABLE) ×4 IMPLANT
TRAY FOLEY W/BAG SLVR 14FR LF (SET/KITS/TRAYS/PACK) IMPLANT
TUBE CONNECTING 20'X1/4 (TUBING) ×1
TUBE CONNECTING 20X1/4 (TUBING) ×2 IMPLANT
UNDERPAD 30X36 HEAVY ABSORB (UNDERPADS AND DIAPERS) ×6 IMPLANT
YANKAUER SUCT BULB TIP NO VENT (SUCTIONS) ×3 IMPLANT

## 2020-09-14 NOTE — Op Note (Signed)
Operative Note   DATE OF OPERATION: 2.15.22  LOCATION: [facility]-outpatient  SURGICAL DIVISION: Plastic Surgery  PREOPERATIVE DIAGNOSES:  1. History right breast cancer 2. History therapeutic radiation 3. Post operative asymmetry breasts 4. Intertrigo  POSTOPERATIVE DIAGNOSES:  same  PROCEDURE:  Bilateral breast reduction  SURGEON: Irene Limbo MD MBA  ASSISTANT: none  ANESTHESIA:  General.   EBL: 35 ml  COMPLICATIONS: None immediate.   INDICATIONS FOR PROCEDURE:  The patient, Jennifer Bartlett, is a 75 y.o. female born on [birth year], is here for treatment breast asymmetry, intertrigo in setting macromastia and prior lumpectomy for cancer treatment.   FINDINGS: Right reduction 267 g Left reduction 316 g  DESCRIPTION OF PROCEDURE:  The patient was marked standing in the preoperative area to mark sternal notch, chest midline, anterior axillary lines, inframammary folds. The location of new nipple areolar complex was marked at level of on inframammary fold on anterior surface breast by palpation. This was marked symmetric over bilateral breasts. With aid of Wise pattern marker, location of new nipple areolar complex and vertical limbs (6.5cm) were markedby displacement of breasts along meridian. The patient was taken to the operating room. SCDs were placedand IV antibiotics were given. The patient's operative site was prepped and draped in a sterile fashion. A time out was performed and all information was confirmed to be correct.   Over left breast, superomedial pedicle marked and nipple areolar complexincisedwith 51mm diameter marker on stretch. Pedicle deepithlialized and developedto chest wall. Breast tissue resected over lower pole. Medial and lateral flaps developed.Additionalsuperior pole andlateral chest wall tissue excised.Breast tailor tacked closed.  I then directed attention to right breast where superomedial pedicle designed.NAC marked with41mm  diameter marker.The pedicle was deepithelialized. Pedicle developed until tension free rotation was possible.Breast tissue resected over lower pole. Medial and lateral flaps developed.Additionalsuperior pole andlateral chest wall tissue excised. Additional dissection completed over superior pole to free tethered soft tissue in area prior lumpectomy. Small seroma/fat necrosis drained. On hemoclip removed. Additional hemoclip visible remained in prior lumpectomy cavity. Breast tailor tacked closed, and patient brought to upright sitting position and assessed for symmetry. Patent returned to supine position. Breast cavities irrigated and hemostasis obtained.Local anestheticinfiltrated throughout each breast. 15 Fr JP placed in each breast and secured with 2-0 nylon. Closure completed bilateralwith 3-0 vicryl to approximate dermis along inframammary fold and vertical limb. NAC inset with 4-0 vicryl in dermis. Skin closure completed with 4-0 monocryl subcuticular throughout. Tissue adhesive applied. Dry dressing and breast binder applied.  The patient was allowed to wake from anesthesia, extubated and taken to the recovery room in satisfactory condition.   SPECIMENS: right and left breast reduction  DRAINS: 15 Fr JP in right and left breast

## 2020-09-14 NOTE — Interval H&P Note (Signed)
History and Physical Interval Note:  09/14/2020 7:00 AM  Jennifer Bartlett  has presented today for surgery, with the diagnosis of history breast ca, history therapeutic radiation, post operative asymmetry breasts.  The various methods of treatment have been discussed with the patient and family. After consideration of risks, benefits and other options for treatment, the patient has consented to  Procedure(s): MAMMARY REDUCTION  (BREAST) (Bilateral) as a surgical intervention.  The patient's history has been reviewed, patient examined, no change in status, stable for surgery.  I have reviewed the patient's chart and labs.  Questions were answered to the patient's satisfaction.     Arnoldo Hooker Audrina Marten

## 2020-09-14 NOTE — Anesthesia Postprocedure Evaluation (Signed)
Anesthesia Post Note  Patient: Jennifer Bartlett  Procedure(s) Performed: MAMMARY REDUCTION  (BREAST) (Bilateral Breast)     Patient location during evaluation: PACU Anesthesia Type: General Level of consciousness: awake and alert Pain management: pain level controlled Vital Signs Assessment: post-procedure vital signs reviewed and stable Respiratory status: spontaneous breathing, nonlabored ventilation, respiratory function stable and patient connected to nasal cannula oxygen Cardiovascular status: blood pressure returned to baseline and stable Postop Assessment: no apparent nausea or vomiting Anesthetic complications: no   No complications documented.  Last Vitals:  Vitals:   09/14/20 1145 09/14/20 1157  BP: (!) 151/75 (!) 163/76  Pulse: 95 100  Resp: 15 18  Temp:    SpO2: 94% 97%    Last Pain:  Vitals:   09/14/20 1157  TempSrc:   PainSc: 0-No pain                 Catalina Gravel

## 2020-09-14 NOTE — Transfer of Care (Signed)
Immediate Anesthesia Transfer of Care Note  Patient: Jennifer Bartlett  Procedure(s) Performed: MAMMARY REDUCTION  (BREAST) (Bilateral Breast)  Patient Location: PACU  Anesthesia Type:General  Level of Consciousness: awake, alert , oriented, drowsy and patient cooperative  Airway & Oxygen Therapy: Patient Spontanous Breathing and Patient connected to face mask oxygen  Post-op Assessment: Report given to RN and Post -op Vital signs reviewed and stable  Post vital signs: Reviewed and stable  Last Vitals:  Vitals Value Taken Time  BP    Temp    Pulse    Resp    SpO2      Last Pain:  Vitals:   09/14/20 0630  TempSrc: Oral  PainSc: 0-No pain      Patients Stated Pain Goal: 6 (09/11/15 3567)  Complications: No complications documented.

## 2020-09-14 NOTE — Anesthesia Procedure Notes (Signed)

## 2020-09-14 NOTE — Discharge Instructions (Signed)
No Tylenol until after 1230pm today, if needed.  Post Anesthesia Home Care Instructions  Activity: Get plenty of rest for the remainder of the day. A responsible individual must stay with you for 24 hours following the procedure.  For the next 24 hours, DO NOT: -Drive a car -Paediatric nurse -Drink alcoholic beverages -Take any medication unless instructed by your physician -Make any legal decisions or sign important papers.      JP Drain [employer] this sheet to all of your post-operative appointments while you have your drains.  Please measure your drains by CC's or ML's.  Make sure you drain and measure your JP Drains 2 or 3 times per day.  At the end of each day, add up totals for the left side and add up totals for the right side.    ( 9 am )     ( 3 pm )        ( 9 pm )                Date L  R  L  R  L  R  Total L/R                                                                                                                                                                                        Meals: Start with liquid foods such as gelatin or soup. Progress to regular foods as tolerated. Avoid greasy, spicy, heavy foods. If nausea and/or vomiting occur, drink only clear liquids until the nausea and/or vomiting subsides. Call your physician if vomiting continues.  Special Instructions/Symptoms: Your throat may feel dry or sore from the anesthesia or the breathing tube placed in your throat during surgery. If this causes discomfort, gargle with warm salt water. The discomfort should disappear within 24 hours.  If you had a scopolamine patch placed behind your ear for the management of post- operative nausea and/or vomiting:  1. The medication in the patch is effective for 72 hours, after which it should be removed.  Wrap patch in a tissue and discard in the trash. Wash hands thoroughly with soap and water. 2. You may remove the patch earlier than 72 hours  if you experience unpleasant side effects which may include dry mouth, dizziness or visual disturbances. 3. Avoid touching the patch. Wash your hands with soap and water after contact with the patch.

## 2020-09-15 ENCOUNTER — Encounter (HOSPITAL_BASED_OUTPATIENT_CLINIC_OR_DEPARTMENT_OTHER): Payer: Self-pay | Admitting: Plastic Surgery

## 2020-09-15 LAB — SURGICAL PATHOLOGY

## 2020-10-13 ENCOUNTER — Other Ambulatory Visit: Payer: Self-pay | Admitting: Family Medicine

## 2020-10-13 ENCOUNTER — Other Ambulatory Visit: Payer: Self-pay | Admitting: Oncology

## 2020-10-14 NOTE — Telephone Encounter (Signed)
Pharmacy requests refill on: Euthyrox 25 mcg   LAST REFILL: 08/30/2020 (Q-30, R-0)  LAST OV: 05/11/2020 NEXT OV: Not Scheduled  PHARMACY: [facility], [place] requests refill on: Omeprazole 20 mg   LAST REFILL: 05/11/2020 (Q-90, R-3) LAST OV: 05/11/2020 NEXT OV: Not Scheduled  PHARMACY: [place], [place] [facility]liest [place] Date: 05/11/2021

## 2020-11-06 ENCOUNTER — Other Ambulatory Visit: Payer: Self-pay | Admitting: Family Medicine

## 2020-11-24 DIAGNOSIS — Z20822 Contact with and (suspected) exposure to covid-19: Secondary | ICD-10-CM | POA: Diagnosis not present

## 2020-11-26 ENCOUNTER — Telehealth (INDEPENDENT_AMBULATORY_CARE_PROVIDER_SITE_OTHER): Payer: Medicare Other | Admitting: Family Medicine

## 2020-11-26 ENCOUNTER — Telehealth: Payer: Self-pay

## 2020-11-26 VITALS — Temp 98.6°F | Ht 64.5 in | Wt 210.0 lb

## 2020-11-26 DIAGNOSIS — U071 COVID-19: Secondary | ICD-10-CM | POA: Insufficient documentation

## 2020-11-26 MED ORDER — PROMETHAZINE-DM 6.25-15 MG/5ML PO SYRP: 5.0000 mL | ORAL_SOLUTION | Freq: Every evening | ORAL | 0 refills | Status: DC | PRN

## 2020-11-26 NOTE — Telephone Encounter (Signed)
Pt said she traveled in a car no mask with 2 other ladies for a day trip to go to [employer] hat convention in [place] on 11/20/20.did not wear mask at event (10 people per table). On 11/21/20 symptoms started with pt being achy, first of wk pt had temp 100; pt continues with prod cough with clear phlegm, some body aches,runny nose, scratchy throat. Pt had diarrhea until 11/25/20 and pt had a h/a on 11/23/20 but no h/a or diarrhea today. Pt had covid test done at [facility] and got back + covid test results on 11/25/20. Pt does not want to do a video visit but said she can do a phone visit; pt will ck temp prior to appt.  No CP or SOB. UC & ED precautions given and pt voiced understanding. Pt scheduled phone virtual visit with Dr Diona Browner 11/26/20 at [facility]. Pt will self quarantine,drink plenty of liquids,rest and take tylenol if needed.

## 2020-11-26 NOTE — Progress Notes (Signed)
TELEPHONE VISIT  Due to national recommendations of social distancing due to [place] 19, Audio telehealth visit is felt to be most appropriate for this patient at this time.   Interactive audio and video telecommunications were attempted between this provider and patient, however failed, due to patient having technical difficulties OR patient did not have access to video capability.  We continued and completed visit with audio only.   I connected with Jennifer Bartlett on 11/26/20 at  2:00 PM EDT by telephone and verified that I am speaking with the correct person using two identifiers.    I discussed the limitations, risks, security and privacy concerns of performing an evaluation and management service by telephone and the availability of in person appointments. I also discussed with the patient that there may be a patient responsible charge related to this service. The patient expressed understanding and agreed to proceed.  Patient location: Home Provider Location: [facility] [place] Participants: Eliezer Lofts and Leticia Clas  Chief Complaint  Patient presents with  . Cough    All symptoms since Sunday.   . Generalized Body Aches    Monday- Thursday  . Sore Throat  . Covid Positive    Tested on Monday    History of Present Illness: 75 year old female with history of obesity and HTN presents with COVID infection.  Date of Onset: 4/24 night.. on day 6 of illness  She reports body ache, flu like symptoms. NO SOB, no wheeze.  She has dry cough, occ clearly productive. Occ keeping her up at night initially, no longer. Nasal congestion. No fever.   Her symptoms are improving now.  Using tylenol.  No chronic lung disease. She is not currently immunocompromised.  History of breast cancer.  COVID 19 screen COVID testing: positive COVID vaccine: 10/13/2019 , 09/22/2019 COVID exposure: No recent travel or known exposure to [place]  The importance of social distancing was  discussed today.     Review of Systems  Constitutional: Positive for malaise/fatigue. Negative for fever.  HENT: Positive for congestion.   Respiratory: Positive for cough. Negative for shortness of breath.   Cardiovascular: Negative for chest pain.      Past Medical History:  Diagnosis Date  . Anemia   . Anxiety   . Arthritis    hands, knees  . Breast cancer ([facility])   . Breast cancer of upper-inner quadrant of right female breast ([facility]) 02/26/2015  . Depression   . GERD (gastroesophageal reflux disease)   . Hot flashes   . Hyperlipidemia   . Hypertension   . Hypoglycemia   . Hypothyroidism   . Obesity   . Personal history of radiation therapy   . Vitamin B 12 deficiency     reports that she has never smoked. She has never used smokeless tobacco. She reports that she does not drink alcohol and does not use drugs.   Current Outpatient Medications:  .  acetaminophen (TYLENOL) 500 MG tablet, Take 1,000 mg by mouth every 6 (six) hours as needed., Disp: , Rfl:  .  BIOTIN PO, Take 1 tablet by mouth daily., Disp: , Rfl:  .  CALCIUM PO, Take by mouth daily., Disp: , Rfl:  .  Cholecalciferol (D3 VITAMIN PO), Take 4,000 Units by mouth daily., Disp: , Rfl:  .  EUTHYROX 25 MCG tablet, TAKE 1 TABLET BY MOUTH ONCE DAILY BEFORE BREAKFAST, Disp: 90 tablet, Rfl: 1 .  gabapentin (NEURONTIN) 300 MG capsule, Take 2 capsules (600 mg total) by  mouth at bedtime., Disp: 180 capsule, Rfl: 4 .  hydrochlorothiazide (HYDRODIURIL) 25 MG tablet, Take 1 tablet (25 mg total) by mouth daily., Disp: 90 tablet, Rfl: 3 .  ketoconazole (NIZORAL) 2 % cream, APPLY TOPICALLY DAILY, Disp: 15 g, Rfl: 0 .  loratadine (CLARITIN) 10 MG tablet, Take 10 mg by mouth daily., Disp: , Rfl:  .  Multiple Vitamin (MULTIVITAMIN) capsule, Take 1 capsule by mouth daily., Disp: , Rfl:  .  omeprazole (PRILOSEC) 20 MG capsule, Take 1 capsule (20 mg total) by mouth daily., Disp: 90 capsule, Rfl: 3 .  venlafaxine XR (EFFEXOR-XR) 150 MG  24 hr capsule, Take 1 capsule (150 mg total) by mouth daily with breakfast., Disp: 90 capsule, Rfl: 4   Observations/Objective: Temperature 98.6 F (37 C), temperature source Oral, height 5' 4.5" (1.638 m), weight 210 lb (95.3 kg), last menstrual period 06/08/1989.  Physical Exam  Physical Exam Constitutional:      General: The patient is not in acute distress. Pulmonary:     Effort: Pulmonary effort is normal. No respiratory distress.  Neurological:     Mental Status: The patient is alert and oriented to person, place, and time.  Psychiatric:        Mood and Affect: Mood normal.        Behavior: Behavior normal.   Assessment and Plan    Problem List Items Addressed This Visit    COVID-19 virus infection - Primary    COVID19  Infection > 5 days from onset of symptoms in double vaccinated overweight, > 60 year old individual with HTN.  No clear sign of bacterial infection at this time.   No SOB.  No red flags/need for ER visit or in-person exam at respiratory clinic at this time..    Pt higher risk for COVID complications but she has passed the 5 day requirement for starting antivirals. Offered MAB given < 7 days but given she is doing so well she would like to hold off on this treatment.   Symptomatic care with mucinex and cough suppressant at night. If SOB begins symptoms worsening.. have low threshold for in-person exam, if severe shortness of breath ER visit recommended.  Can monitor Oxygen saturation at home with home monitor if able to obtain.  Go to ER if O2 sat < 90% on room air.  Reviewed home care and provided information. Recommended quarantine 5 days isolation recommended. Return to work/public day 6 and wear mask for 4 more days to complete 10 days. Provided info about prevention of spread of COVID 19.          I discussed the assessment and treatment plan with the patient. The patient was provided an opportunity to ask questions and all were answered. The  patient agreed with the plan and demonstrated an understanding of the instructions.   The patient was advised to call back or seek an in-person evaluation if the symptoms worsen or if the condition fails to improve as anticipated.  I provided 13.5 minutes of non-face-to-face time during this encounter.   Eliezer Lofts, MD

## 2020-11-26 NOTE — Assessment & Plan Note (Signed)
COVID19  Infection > 5 days from onset of symptoms in double vaccinated overweight, > 74 year old individual with HTN.  No clear sign of bacterial infection at this time.   No SOB.  No red flags/need for ER visit or in-person exam at respiratory clinic at this time..    Pt higher risk for COVID complications but she has passed the 5 day requirement for starting antivirals. Offered MAB given < 7 days but given she is doing so well she would like to hold off on this treatment.   Symptomatic care with mucinex and cough suppressant at night. If SOB begins symptoms worsening.. have low threshold for in-person exam, if severe shortness of breath ER visit recommended.  Can monitor Oxygen saturation at home with home monitor if able to obtain.  Go to ER if O2 sat < 90% on room air.  Reviewed home care and provided information. Recommended quarantine 5 days isolation recommended. Return to work/public day 6 and wear mask for 4 more days to complete 10 days. Provided info about prevention of spread of COVID 19.

## 2020-11-30 ENCOUNTER — Other Ambulatory Visit: Payer: Medicare Other

## 2020-11-30 NOTE — Telephone Encounter (Signed)
Pt had visit with Dr Diona Browner on 11/26/20.

## 2020-12-04 ENCOUNTER — Other Ambulatory Visit: Payer: Self-pay | Admitting: Family Medicine

## 2020-12-07 NOTE — Telephone Encounter (Signed)
Earliest Fill Date for medications: 05/11/2021

## 2020-12-28 DIAGNOSIS — H2513 Age-related nuclear cataract, bilateral: Secondary | ICD-10-CM | POA: Diagnosis not present

## 2020-12-30 ENCOUNTER — Other Ambulatory Visit: Payer: Self-pay | Admitting: Family Medicine

## 2020-12-30 DIAGNOSIS — Z1231 Encounter for screening mammogram for malignant neoplasm of breast: Secondary | ICD-10-CM

## 2021-01-12 ENCOUNTER — Other Ambulatory Visit: Payer: Self-pay | Admitting: Family Medicine

## 2021-01-17 DIAGNOSIS — Z923 Personal history of irradiation: Secondary | ICD-10-CM | POA: Diagnosis not present

## 2021-01-17 DIAGNOSIS — Z853 Personal history of malignant neoplasm of breast: Secondary | ICD-10-CM | POA: Diagnosis not present

## 2021-01-17 DIAGNOSIS — Z9889 Other specified postprocedural states: Secondary | ICD-10-CM | POA: Diagnosis not present

## 2021-01-19 DIAGNOSIS — H25812 Combined forms of age-related cataract, left eye: Secondary | ICD-10-CM | POA: Diagnosis not present

## 2021-01-19 DIAGNOSIS — H25811 Combined forms of age-related cataract, right eye: Secondary | ICD-10-CM | POA: Diagnosis not present

## 2021-01-19 DIAGNOSIS — Z01818 Encounter for other preprocedural examination: Secondary | ICD-10-CM | POA: Diagnosis not present

## 2021-02-04 DIAGNOSIS — H2512 Age-related nuclear cataract, left eye: Secondary | ICD-10-CM | POA: Diagnosis not present

## 2021-02-04 DIAGNOSIS — H25811 Combined forms of age-related cataract, right eye: Secondary | ICD-10-CM | POA: Diagnosis not present

## 2021-02-04 DIAGNOSIS — H2511 Age-related nuclear cataract, right eye: Secondary | ICD-10-CM | POA: Diagnosis not present

## 2021-02-10 ENCOUNTER — Other Ambulatory Visit: Payer: Self-pay | Admitting: Family Medicine

## 2021-02-18 DIAGNOSIS — H2512 Age-related nuclear cataract, left eye: Secondary | ICD-10-CM | POA: Diagnosis not present

## 2021-02-18 DIAGNOSIS — H25812 Combined forms of age-related cataract, left eye: Secondary | ICD-10-CM | POA: Diagnosis not present

## 2021-02-18 DIAGNOSIS — H2511 Age-related nuclear cataract, right eye: Secondary | ICD-10-CM | POA: Diagnosis not present

## 2021-04-13 DIAGNOSIS — Z20822 Contact with and (suspected) exposure to covid-19: Secondary | ICD-10-CM | POA: Diagnosis not present

## 2021-04-22 ENCOUNTER — Other Ambulatory Visit: Payer: Self-pay | Admitting: Family Medicine

## 2021-04-25 NOTE — Telephone Encounter (Signed)
Pt's due for her AWV (phone call if possible) and CPE appt in Oct, please schedule before refill pt's thyroid med, if she can't come to [place] schedule appt's in Late Dec early Jan then route note back to me, thanks

## 2021-04-26 NOTE — Telephone Encounter (Signed)
Called patient and left vm.

## 2021-04-27 NOTE — Telephone Encounter (Signed)
2nd attempt  LMTCB to schedule appts

## 2021-04-27 NOTE — Telephone Encounter (Signed)
Will give # 30 to give pt time to make appt

## 2021-05-12 ENCOUNTER — Other Ambulatory Visit: Payer: Self-pay

## 2021-05-12 ENCOUNTER — Ambulatory Visit
Admission: RE | Admit: 2021-05-12 | Discharge: 2021-05-12 | Disposition: A | Discharge status: Home / Self Care | Payer: Medicare Other | Source: Ambulatory Visit | Attending: Family Medicine | Admitting: Family Medicine

## 2021-05-12 DIAGNOSIS — Z780 Asymptomatic menopausal state: Secondary | ICD-10-CM | POA: Diagnosis not present

## 2021-05-12 DIAGNOSIS — E2839 Other primary ovarian failure: Secondary | ICD-10-CM

## 2021-05-14 DIAGNOSIS — Z20822 Contact with and (suspected) exposure to covid-19: Secondary | ICD-10-CM | POA: Diagnosis not present

## 2021-05-16 ENCOUNTER — Encounter: Payer: Self-pay | Admitting: *Deleted

## 2021-05-20 ENCOUNTER — Other Ambulatory Visit: Payer: Self-pay

## 2021-05-20 ENCOUNTER — Ambulatory Visit (INDEPENDENT_AMBULATORY_CARE_PROVIDER_SITE_OTHER): Payer: Medicare Other

## 2021-05-20 DIAGNOSIS — Z23 Encounter for immunization: Secondary | ICD-10-CM | POA: Diagnosis not present

## 2021-06-01 ENCOUNTER — Ambulatory Visit (INDEPENDENT_AMBULATORY_CARE_PROVIDER_SITE_OTHER): Payer: Medicare Other | Admitting: Family Medicine

## 2021-06-01 ENCOUNTER — Encounter: Payer: Self-pay | Admitting: Family Medicine

## 2021-06-01 ENCOUNTER — Other Ambulatory Visit: Payer: Self-pay

## 2021-06-01 VITALS — BP 138/70 | HR 82 | Temp 97.5°F | Ht 63.5 in | Wt 222.1 lb

## 2021-06-01 DIAGNOSIS — Z79899 Other long term (current) drug therapy: Secondary | ICD-10-CM | POA: Insufficient documentation

## 2021-06-01 DIAGNOSIS — R7303 Prediabetes: Secondary | ICD-10-CM | POA: Diagnosis not present

## 2021-06-01 DIAGNOSIS — Z853 Personal history of malignant neoplasm of breast: Secondary | ICD-10-CM

## 2021-06-01 DIAGNOSIS — E559 Vitamin D deficiency, unspecified: Secondary | ICD-10-CM | POA: Diagnosis not present

## 2021-06-01 DIAGNOSIS — I10 Essential (primary) hypertension: Secondary | ICD-10-CM | POA: Diagnosis not present

## 2021-06-01 DIAGNOSIS — E039 Hypothyroidism, unspecified: Secondary | ICD-10-CM

## 2021-06-01 DIAGNOSIS — E7800 Pure hypercholesterolemia, unspecified: Secondary | ICD-10-CM | POA: Diagnosis not present

## 2021-06-01 DIAGNOSIS — E538 Deficiency of other specified B group vitamins: Secondary | ICD-10-CM | POA: Diagnosis not present

## 2021-06-01 DIAGNOSIS — Z6838 Body mass index (BMI) 38.0-38.9, adult: Secondary | ICD-10-CM

## 2021-06-01 DIAGNOSIS — Z0000 Encounter for general adult medical examination without abnormal findings: Secondary | ICD-10-CM | POA: Diagnosis not present

## 2021-06-01 LAB — HEMOGLOBIN A1C: Hgb A1c MFr Bld: 6.1 % (ref 4.6–6.5)

## 2021-06-01 LAB — LIPID PANEL
Cholesterol: 214 mg/dL — ABNORMAL HIGH (ref 0–200)
HDL: 48.2 mg/dL (ref >39.00)
LDL Cholesterol: 140 mg/dL — ABNORMAL HIGH (ref 0–99)
NonHDL: 166.17
Total CHOL/HDL Ratio: 4
Triglycerides: 132 mg/dL (ref 0.0–149.0)
VLDL: 26.4 mg/dL (ref 0.0–40.0)

## 2021-06-01 LAB — CBC WITH DIFFERENTIAL/PLATELET
Basophils Absolute: 0.1 10*3/uL (ref 0.0–0.1)
Basophils Relative: 0.9 % (ref 0.0–3.0)
Eosinophils Absolute: 0.3 10*3/uL (ref 0.0–0.7)
Eosinophils Relative: 3.7 % (ref 0.0–5.0)
HCT: 31.8 % — ABNORMAL LOW (ref 36.0–46.0)
Hemoglobin: 9.9 g/dL — ABNORMAL LOW (ref 12.0–15.0)
Lymphocytes Relative: 20.5 % (ref 12.0–46.0)
Lymphs Abs: 1.7 10*3/uL (ref 0.7–4.0)
MCHC: 31.1 g/dL (ref 30.0–36.0)
MCV: 80.7 fl (ref 78.0–100.0)
Monocytes Absolute: 0.8 10*3/uL (ref 0.1–1.0)
Monocytes Relative: 8.9 % (ref 3.0–12.0)
Neutro Abs: 5.6 10*3/uL (ref 1.4–7.7)
Neutrophils Relative %: 66 % (ref 43.0–77.0)
Platelets: 337 10*3/uL (ref 150.0–400.0)
RBC: 3.94 Mil/uL (ref 3.87–5.11)
RDW: 16.9 % — ABNORMAL HIGH (ref 11.5–15.5)
WBC: 8.4 10*3/uL (ref 4.0–10.5)

## 2021-06-01 LAB — COMPREHENSIVE METABOLIC PANEL
ALT: 10 U/L (ref 0–35)
AST: 15 U/L (ref 0–37)
Albumin: 4 g/dL (ref 3.5–5.2)
Alkaline Phosphatase: 95 U/L (ref 39–117)
BUN: 18 mg/dL (ref 6–23)
CO2: 31 mEq/L (ref 19–32)
Calcium: 8.9 mg/dL (ref 8.4–10.5)
Chloride: 98 mEq/L (ref 96–112)
Creatinine, Ser: 0.91 mg/dL (ref 0.40–1.20)
GFR: 62.31 mL/min (ref >60.00)
Glucose, Bld: 119 mg/dL — ABNORMAL HIGH (ref 70–99)
Potassium: 4.3 mEq/L (ref 3.5–5.1)
Sodium: 137 mEq/L (ref 135–145)
Total Bilirubin: 0.3 mg/dL (ref 0.2–1.2)
Total Protein: 7.1 g/dL (ref 6.0–8.3)

## 2021-06-01 LAB — TSH: TSH: 7.01 u[IU]/mL — ABNORMAL HIGH (ref 0.35–5.50)

## 2021-06-01 LAB — VITAMIN B12: Vitamin B-12: 432 pg/mL (ref 211–911)

## 2021-06-01 LAB — VITAMIN D 25 HYDROXY (VIT D DEFICIENCY, FRACTURES): VITD: 50.42 ng/mL (ref 30.00–100.00)

## 2021-06-01 MED ORDER — HYDROCHLOROTHIAZIDE 25 MG PO TABS: 25.0000 mg | ORAL_TABLET | Freq: Every day | ORAL | 3 refills | Status: DC

## 2021-06-01 MED ORDER — OMEPRAZOLE 20 MG PO CPDR: 20.0000 mg | DELAYED_RELEASE_CAPSULE | Freq: Every day | ORAL | 3 refills | Status: DC

## 2021-06-01 MED ORDER — LEVOTHYROXINE SODIUM 25 MCG PO TABS: 25.0000 ug | ORAL_TABLET | Freq: Every day | ORAL | 3 refills | Status: DC

## 2021-06-01 NOTE — Assessment & Plan Note (Signed)
A1C ordered Diet is fairly stable  Obesity plays a role  disc imp of low glycemic diet and wt loss to prevent DM2

## 2021-06-01 NOTE — Progress Notes (Signed)
Subjective:    Patient ID: Jennifer Bartlett, female    DOB: [birth year], 75 y.o.   MRN: [id number]  This visit occurred during the SARS-CoV-2 public health emergency.  Safety protocols were in place, including screening questions prior to the visit, additional usage of staff PPE, and extensive cleaning of exam room while observing appropriate contact time as indicated for disinfecting solutions.   HPI Pt presents for amw and health mt visit   I have personally reviewed the Medicare Annual Wellness questionnaire and have noted 1. The patient's medical and social history 2. Their use of alcohol, tobacco or illicit drugs 3. Their current medications and supplements 4. The patient's functional ability including ADL's, fall risks, home safety risks and hearing or visual             impairment. 5. Diet and physical activities 6. Evidence for depression or mood disorders  The patients weight, height, BMI have been recorded in the chart and visual acuity is per eye clinic.  I have made referrals, counseling and provided education to the patient based review of the above and I have provided the pt with a written personalized care plan for preventive services. Reviewed and updated provider list, see scanned forms.  See scanned forms.  Routine anticipatory guidance given to patient.  See health maintenance. Colon cancer screening  cologuard neg 12/2019 Breast cancer screening  mammogram 05/2020 has it scheduled on the 7th Personal h/o breast cancer, has had a breast reduction and taken tamoxifen  Self breast exam: no new lumps / she has baseline altered sensation in L nipple, goes to see her surgeon next month  Tetanus vaccine  10/17 Tdap  Covid immunized  Flu shot last month  pneumovax Zoster vaccine: interested if affordable  Dexa last month was normal  Falls: none  Fractures:none  Supplements vit D 2000 iu daily  Exercise :   walking /getting back to after surgery    Advance  directive:  given materials to work on  Cognitive function addressed- see scanned forms- and if abnormal then additional documentation follows.   No concerns at all  She takes prevagen over the counter  Also exercises  Socializes a lot /interfaces with the public and goes to events  No problems doing finances  Loves to read  Keeps up with current events   PMH and SH reviewed  Meds, vitals, and allergies reviewed.   ROS: See HPI.  Otherwise negative.    Weight : Wt Readings from Last 3 Encounters:  06/01/21 222 lb 2 oz (100.8 kg)  11/26/20 210 lb (95.3 kg)  09/14/20 213 lb 6.5 oz (96.8 kg)   38.73 kg/m  Diet is fair  Does not eat much added sugar   Drinks water and some tea or coffee  No sodas at all    H/o gastric bypass in the past   Hearing/vision: Hearing Screening   500Hz  1000Hz  2000Hz  4000Hz   Right ear 40 40 40 40  Left ear 40 40 40 40  Vision Screening - Comments:: Dr. Jean Rosenthal at [facility] exam in June/July 2022    PHQ: Depression screen [facility] 2/9 06/01/2021 05/11/2020 03/26/2020 12/05/2019 07/16/2017  Decreased Interest 0 0 0 0 0  Down, Depressed, Hopeless 0 0 0 0 0  PHQ - 2 Score 0 0 0 0 0  Altered sleeping - 0 0 1 -  Tired, decreased energy - 0 0 1 -  Change in appetite - 0 0 0 -  Feeling  bad or failure about yourself  - 0 0 0 -  Trouble concentrating - 0 0 0 -  Moving slowly or fidgety/restless - 0 0 0 -  Suicidal thoughts - 0 0 0 -  PHQ-9 Score - 0 0 2 -  Difficult doing work/chores - - Not difficult at all - -   Takes effexor and gabapentin for hot flashes  Off of those now, doing better   ADLs: no problems  Functionality: very good   Care team ; Cherlyn Syring-pcp Byerly- gen surg Lucey-ortho  Shah-oph Thimmappa-plastic surg  HTN bp is stable today  No cp or palpitations or headaches or edema  No side effects to medicines  BP Readings from Last 3 Encounters:  06/01/21 138/70  09/14/20 (!) 163/76  06/08/20 (!) 159/66    Runs  higher in the doctor office  At home she checks once per week - usually runs under 140 systolic , even low 130s    Pulse Readings from Last 3 Encounters:  06/01/21 82  09/14/20 100  06/08/20 76   Takes hctz 25 mg daily   Hypothyroidism  Pt has no clinical changes No change in energy level/ hair or skin/ edema and no tremor She is always tired but less tired than previously  Lab Results  Component Value Date   TSH 4.41 09/02/2020    Takes levothyroxine 25 mcg daily  Vit B12 def Lab Results  Component Value Date   VITAMINB12 280 05/03/2020   Due for labs  Hyperlipidemia Lab Results  Component Value Date   CHOL 222 (H) 05/03/2020   HDL 59.60 05/03/2020   LDLCALC 136 (H) 05/03/2020   LDLDIRECT 139.2 08/25/2013   TRIG 133.0 05/03/2020   CHOLHDL 4 05/03/2020   Diet controlled Intolerant to all statin doses  No red meat (eats poultry and fish) No fried foods    Prediabetes Lab Results  Component Value Date   HGBA1C 6.2 05/03/2020   Diet has not changed Mindful of sugar  Patient Active Problem List   Diagnosis Date Noted   Medicare annual wellness visit, subsequent 06/01/2021   Current use of proton pump inhibitor 06/01/2021   COVID-19 virus infection 11/26/2020   Hypothyroid 01/07/2020   History of breast cancer 12/05/2019   Prediabetes 07/04/2016   Routine general medical examination at a health care facility 07/04/2016   Encounter for hepatitis C screening test for low risk patient 06/29/2016   Estrogen deficiency 01/11/2016   Malignant neoplasm of upper-inner quadrant of right breast in female, estrogen receptor positive (HCC) 02/26/2015   Colon cancer screening 09/06/2012   Vitamin D deficiency 12/28/2009   STRESS REACTION, ACUTE, WITH EMOTIONAL DISTURBANCE 12/07/2009   HYPOGLYCEMIA NOS 05/28/2007   Vitamin B12 deficiency 03/07/2007   HYPERCHOLESTEROLEMIA 03/07/2007   Class 2 severe obesity due to excess calories with serious comorbidity and body mass  index (BMI) of 38.0 to 38.9 in adult [facility]) 03/07/2007   Essential hypertension 03/07/2007   GERD 03/07/2007   Past Medical History:  Diagnosis Date   Anemia    Anxiety    Arthritis    hands, knees   Breast cancer (HCC)    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   Depression    GERD (gastroesophageal reflux disease)    Hot flashes    Hyperlipidemia    Hypertension    Hypoglycemia    Hypothyroidism    Obesity    Personal history of radiation therapy    Vitamin B  12 deficiency    Past Surgical History:  Procedure Laterality Date   ABDOMINAL HYSTERECTOMY     BREAST BIOPSY     BREAST LUMPECTOMY Right    2016   BREAST REDUCTION SURGERY Bilateral 09/14/2020   Procedure: MAMMARY REDUCTION  (BREAST);  Surgeon: Glenna Fellows, MD;  Location: [facility];  Service: Plastics;  Laterality: Bilateral;   GASTRIC BYPASS  08/2003   HERNIA REPAIR     KNEE SURGERY     left   PARTIAL HYSTERECTOMY  1990's   bleeding   RADIOACTIVE SEED GUIDED PARTIAL MASTECTOMY WITH AXILLARY SENTINEL LYMPH NODE BIOPSY Right 03/09/2015   Procedure: RADIOACTIVE SEED GUIDED RIGHT PARTIAL MASTECTOMY WITH AXILLARY SENTINEL LYMPH NODE BIOPSY;  Surgeon: Almond Lint, MD;  Location: [facility];  Service: General;  Laterality: Right;   TUBAL LIGATION     Social History   Tobacco Use   Smoking status: Never   Smokeless tobacco: Never  Substance Use Topics   Alcohol use: No   Drug use: No   Family History  Problem Relation Age of Onset   Heart disease Mother        MI   Hypertension Mother    Alcohol abuse Father    Heart disease Father        smoker   Hypertension Father    Breast cancer Paternal Grandmother    Diabetes Maternal Aunt    Allergies  Allergen Reactions   Crestor [Rosuvastatin Calcium] Other (See Comments)    Myalgia    Lipitor [Atorvastatin Calcium] Other (See Comments)    Aches and pains   Penicillins Shortness Of  Breath and Swelling   Zocor [Simvastatin - High Dose] Other (See Comments)    Muscle/joint aches   Current Outpatient Medications on File Prior to Visit  Medication Sig Dispense Refill   acetaminophen (TYLENOL) 500 MG tablet Take 1,000 mg by mouth every 6 (six) hours as needed.     BIOTIN PO Take 1 tablet by mouth daily.     CALCIUM PO Take by mouth daily.     Cholecalciferol (D3 VITAMIN PO) Take 4,000 Units by mouth daily.     ketoconazole (NIZORAL) 2 % cream APPLY TOPICALLY DAILY 15 g 0   loratadine (CLARITIN) 10 MG tablet Take 10 mg by mouth daily.     Multiple Vitamin (MULTIVITAMIN) capsule Take 1 capsule by mouth daily.     No current facility-administered medications on file prior to visit.    Review of Systems  Constitutional:  Positive for fatigue. Negative for activity change, appetite change, fever and unexpected weight change.  HENT:  Negative for congestion, ear pain, rhinorrhea, sinus pressure and sore throat.   Eyes:  Negative for pain, redness and visual disturbance.  Respiratory:  Negative for cough, shortness of breath and wheezing.   Cardiovascular:  Negative for chest pain and palpitations.  Gastrointestinal:  Negative for abdominal pain, blood in stool, constipation and diarrhea.  Endocrine: Negative for polydipsia and polyuria.  Genitourinary:  Negative for dysuria, frequency and urgency.  Musculoskeletal:  Negative for arthralgias, back pain and myalgias.  Skin:  Negative for pallor and rash.  Allergic/Immunologic: Negative for environmental allergies.  Neurological:  Negative for dizziness, syncope and headaches.  Hematological:  Negative for adenopathy. Does not bruise/bleed easily.  Psychiatric/Behavioral:  Negative for decreased concentration and dysphoric mood. The patient is not nervous/anxious.       Objective:   Physical Exam Constitutional:      General: She  is not in acute distress.    Appearance: Normal appearance. She is well-developed. She is  obese. She is not ill-appearing or diaphoretic.  HENT:     Head: Normocephalic and atraumatic.     Right Ear: Tympanic membrane, ear canal and external ear normal.     Left Ear: Tympanic membrane, ear canal and external ear normal.     Nose: Nose normal. No congestion.     Mouth/Throat:     Mouth: Mucous membranes are moist.     Pharynx: Oropharynx is clear. No posterior oropharyngeal erythema.  Eyes:     General: No scleral icterus.    Extraocular Movements: Extraocular movements intact.     Conjunctiva/sclera: Conjunctivae normal.     Pupils: Pupils are equal, round, and reactive to light.  Neck:     Thyroid: No thyromegaly.     Vascular: No carotid bruit or JVD.  Cardiovascular:     Rate and Rhythm: Normal rate and regular rhythm.     Pulses: Normal pulses.     Heart sounds: Normal heart sounds.    No gallop.  Pulmonary:     Effort: Pulmonary effort is normal. No respiratory distress.     Breath sounds: Normal breath sounds. No wheezing.     Comments: Good air exch Chest:     Chest wall: No tenderness.  Abdominal:     General: Bowel sounds are normal. There is no distension or abdominal bruit.     Palpations: Abdomen is soft. There is no mass.     Tenderness: There is no abdominal tenderness.     Hernia: No hernia is present.  Genitourinary:    Comments: Breast exam done by surgeon and onc Musculoskeletal:        General: No tenderness. Normal range of motion.     Cervical back: Normal range of motion and neck supple. No rigidity. No muscular tenderness.     Right lower leg: No edema.     Left lower leg: No edema.  Lymphadenopathy:     Cervical: No cervical adenopathy.  Skin:    General: Skin is warm and dry.     Coloration: Skin is not pale.     Findings: No erythema or rash.     Comments: Solar lentigines diffusely Some sks  Neurological:     Mental Status: She is alert. Mental status is at baseline.     Cranial Nerves: No cranial nerve deficit.     Motor: No  abnormal muscle tone.     Coordination: Coordination normal.     Gait: Gait normal.     Deep Tendon Reflexes: Reflexes are normal and symmetric. Reflexes normal.  Psychiatric:        Mood and Affect: Mood normal.        Cognition and Memory: Cognition and memory normal.          Assessment & Plan:   Problem List Items Addressed This Visit       Cardiovascular and Mediastinum   Essential hypertension    bp in fair control at this time  BP Readings from Last 1 Encounters:  06/01/21 138/70  No changes needed Most recent labs reviewed  Disc lifstyle change with low sodium diet and exercise  Plan to continue hctz 25 mg daily  Monitor at home/some white coat issues       Relevant Medications   hydrochlorothiazide (HYDRODIURIL) 25 MG tablet   Other Relevant Orders   CBC with Differential/Platelet   Comprehensive metabolic  panel   Lipid panel   TSH     Endocrine   Hypothyroid    TSH today Some baseline fatigue  Stopped tamoxifen recently  Taking levothyroxine 25 mcg daily      Relevant Medications   levothyroxine (SYNTHROID) 25 MCG tablet   Other Relevant Orders   TSH     Other   Vitamin B12 deficiency    B12 level today  Taking oral supplement  Past bariatric surgery      Relevant Orders   Vitamin B12   Vitamin D deficiency    Taking 4000 iu daily  Disc imp to bone and overall health         Relevant Orders   VITAMIN D 25 Hydroxy (Vit-D Deficiency, Fractures)   HYPERCHOLESTEROLEMIA    Disc goals for lipids and reasons to control them Rev last labs with pt Rev low sat fat diet in detail Intol of all statin doses Diet is good overall        Relevant Medications   hydrochlorothiazide (HYDRODIURIL) 25 MG tablet   Other Relevant Orders   Lipid panel   Class 2 severe obesity due to excess calories with serious comorbidity and body mass index (BMI) of 38.0 to 38.9 in adult [facility])    Discussed how this problem influences overall health and the risks  it imposes  Reviewed plan for weight loss with lower calorie diet (via better food choices and also portion control or program like weight watchers) and exercise building up to or more than 30 minutes 5 days per week including some aerobic activity   Past bariatric surgery       Prediabetes    A1C ordered Diet is fairly stable  Obesity plays a role  disc imp of low glycemic diet and wt loss to prevent DM2        Relevant Orders   Hemoglobin A1c   Routine general medical examination at a health care facility    Reviewed health habits including diet and exercise and skin cancer prevention Reviewed appropriate screening tests for age  Also reviewed health mt list, fam hx and immunization status , as well as social and family history   See HPI Labs ordered  cologuard utd Mammogram planned in a week (personal h/o breast cancer)  Had flu shot  Interested in shingrix if covered  Reviewed recent dexa/ no falls or fractures Given materials to work on Scientist, product/process development  No cognitive concerns/handles own affairs Nl hearing screen  utd eye/vision care  phq score of 0         History of breast cancer    Now off tamoxifen (and gabapentin and effexor) Regular onc and surg f/u      Medicare annual wellness visit, subsequent - Primary    Reviewed health habits including diet and exercise and skin cancer prevention Reviewed appropriate screening tests for age  Also reviewed health mt list, fam hx and immunization status , as well as social and family history   See HPI Labs ordered  cologuard utd Mammogram planned in a week (personal h/o breast cancer)  Had flu shot  Interested in shingrix if covered  Reviewed recent dexa/ no falls or fractures Given materials to work on Scientist, product/process development  No cognitive concerns/handles own affairs Nl hearing screen  utd eye/vision care  phq score of 0

## 2021-06-01 NOTE — Assessment & Plan Note (Signed)
Disc goals for lipids and reasons to control them Rev last labs with pt Rev low sat fat diet in detail Intol of all statin doses Diet is good overall

## 2021-06-01 NOTE — Assessment & Plan Note (Signed)
B12 level today  Taking oral supplement  Past bariatric surgery

## 2021-06-01 NOTE — Assessment & Plan Note (Signed)
Taking 4000 iu daily  Disc imp to bone and overall health

## 2021-06-01 NOTE — Assessment & Plan Note (Signed)
Discussed how this problem influences overall health and the risks it imposes  Reviewed plan for weight loss with lower calorie diet (via better food choices and also portion control or program like weight watchers) and exercise building up to or more than 30 minutes 5 days per week including some aerobic activity   Past bariatric surgery

## 2021-06-01 NOTE — Assessment & Plan Note (Signed)
Reviewed health habits including diet and exercise and skin cancer prevention Reviewed appropriate screening tests for age  Also reviewed health mt list, fam hx and immunization status , as well as social and family history   See HPI Labs ordered  cologuard utd Mammogram planned in a week (personal h/o breast cancer)  Had flu shot  Interested in shingrix if covered  Reviewed recent dexa/ no falls or fractures Given materials to work on [employer] directive  No cognitive concerns/handles own affairs Nl hearing screen  utd eye/vision care  phq score of 0

## 2021-06-01 NOTE — Assessment & Plan Note (Signed)
TSH today Some baseline fatigue  Stopped tamoxifen recently  Taking levothyroxine 25 mcg daily

## 2021-06-01 NOTE — Patient Instructions (Addendum)
If you are interested in the new shingles vaccine (Shingrix) - call your local pharmacy to check on coverage and availability  If affordable, get on a wait list at your pharmacy to get the vaccine.  Please work on your advance directive   Labs today   Take care of yourself

## 2021-06-01 NOTE — Assessment & Plan Note (Signed)
Now off tamoxifen (and gabapentin and effexor) Regular onc and surg f/u

## 2021-06-01 NOTE — Assessment & Plan Note (Signed)
bp in fair control at this time  BP Readings from Last 1 Encounters:  06/01/21 138/70   No changes needed Most recent labs reviewed  Disc lifstyle change with low sodium diet and exercise  Plan to continue hctz 25 mg daily  Monitor at home/some white coat issues

## 2021-06-03 ENCOUNTER — Telehealth: Payer: Self-pay | Admitting: *Deleted

## 2021-06-03 NOTE — Telephone Encounter (Signed)
Left VM requesting pt to call the office back regarding lab results  

## 2021-06-06 ENCOUNTER — Ambulatory Visit
Admission: RE | Admit: 2021-06-06 | Discharge: 2021-06-06 | Disposition: A | Discharge status: Home / Self Care | Payer: Medicare Other | Source: Ambulatory Visit | Attending: Family Medicine | Admitting: Family Medicine

## 2021-06-06 ENCOUNTER — Other Ambulatory Visit: Payer: Self-pay

## 2021-06-06 DIAGNOSIS — Z1231 Encounter for screening mammogram for malignant neoplasm of breast: Secondary | ICD-10-CM

## 2021-06-07 ENCOUNTER — Other Ambulatory Visit: Payer: Self-pay | Admitting: Family Medicine

## 2021-06-07 ENCOUNTER — Other Ambulatory Visit: Payer: Self-pay

## 2021-06-07 DIAGNOSIS — N6320 Unspecified lump in the left breast, unspecified quadrant: Secondary | ICD-10-CM

## 2021-06-07 MED ORDER — LEVOTHYROXINE SODIUM 50 MCG PO TABS: 50.0000 ug | ORAL_TABLET | Freq: Every day | ORAL | 3 refills | Status: DC

## 2021-07-26 ENCOUNTER — Ambulatory Visit
Admission: RE | Admit: 2021-07-26 | Discharge: 2021-07-26 | Disposition: A | Discharge status: Home / Self Care | Payer: Medicare Other | Source: Ambulatory Visit | Attending: Family Medicine | Admitting: Family Medicine

## 2021-07-26 DIAGNOSIS — R922 Inconclusive mammogram: Secondary | ICD-10-CM | POA: Diagnosis not present

## 2021-07-26 DIAGNOSIS — N6320 Unspecified lump in the left breast, unspecified quadrant: Secondary | ICD-10-CM

## 2021-10-17 ENCOUNTER — Telehealth: Payer: Self-pay | Admitting: Family Medicine

## 2021-10-17 DIAGNOSIS — E039 Hypothyroidism, unspecified: Secondary | ICD-10-CM

## 2021-10-17 NOTE — Telephone Encounter (Signed)
Encourage patient to contact the pharmacy for refills or they can request refills through [facility] ? ?Did the patient contact the pharmacy:  Y ? ? ?LAST APPOINTMENT DATE:  11.02.2022 ? ?NEXT APPOINTMENT DATE: Not scheduled yet ? ?MEDICATION:  ?levothyroxine (SYNTHROID) 50 MCG tablet ?Is the patient out of medication?  Y ? ?If not, how much is left? Pt has a few '25mg'$  tablets left, was taking two, then begin taking one '25mg'$  tablet for the last two weeks due to no '50mg'$ s ? ?Is this a 90 day supply: (Patient requesting 90 day supply) ? ?PHARMACY:  ?[facility], [place] Phone:  [phone]  ?Fax:  [phone]  ?  ? ? ?Let patient know to contact pharmacy at the end of the day to make sure medication is ready. ? ?Please notify patient to allow 48-72 hours to process ? ?

## 2021-10-18 NOTE — Telephone Encounter (Signed)
Called and spoke with patient made appt to come to have lab done , pt was suppose back in 6  weeks in nov. To have this recheck. Pt wasn't  aware of this. Explain pt the  this needs to be recheck , because this can effect the dose of her medication. Pt understood and coming in 10/18/21. Pt has enough to last until come back.  Can place lab order for patient. Thanks. ?

## 2021-10-19 ENCOUNTER — Other Ambulatory Visit (INDEPENDENT_AMBULATORY_CARE_PROVIDER_SITE_OTHER): Payer: Medicare Other

## 2021-10-19 ENCOUNTER — Other Ambulatory Visit: Payer: Self-pay

## 2021-10-19 DIAGNOSIS — E039 Hypothyroidism, unspecified: Secondary | ICD-10-CM

## 2021-10-19 LAB — TSH: TSH: 6.37 u[IU]/mL — ABNORMAL HIGH (ref 0.35–5.50)

## 2021-10-20 ENCOUNTER — Telehealth: Payer: Self-pay | Admitting: *Deleted

## 2021-10-20 MED ORDER — LEVOTHYROXINE SODIUM 75 MCG PO TABS: 75.0000 ug | ORAL_TABLET | Freq: Every day | ORAL | 0 refills | Status: DC

## 2021-10-20 NOTE — Telephone Encounter (Signed)
-----   Message from Abner Greenspan, MD sent at 10/19/2021  8:10 PM EDT ----- ?TSH is still elevated ?Need to inc levothyroxine from 50 to 75 mcg ?Please send in #[phone] refills  ?Re check TSH 6 wk please  ?

## 2021-10-20 NOTE — Telephone Encounter (Signed)
Pt called back. Made her lab appt 12-01-21. Sent med to [facility]. ?

## 2021-10-20 NOTE — Telephone Encounter (Signed)
Left VM requesting pt to call the office back 

## 2021-11-30 ENCOUNTER — Telehealth: Payer: Self-pay | Admitting: Family Medicine

## 2021-11-30 DIAGNOSIS — E7800 Pure hypercholesterolemia, unspecified: Secondary | ICD-10-CM

## 2021-11-30 DIAGNOSIS — R7303 Prediabetes: Secondary | ICD-10-CM

## 2021-11-30 DIAGNOSIS — Z79899 Other long term (current) drug therapy: Secondary | ICD-10-CM

## 2021-11-30 DIAGNOSIS — E538 Deficiency of other specified B group vitamins: Secondary | ICD-10-CM

## 2021-11-30 DIAGNOSIS — E559 Vitamin D deficiency, unspecified: Secondary | ICD-10-CM

## 2021-11-30 DIAGNOSIS — I10 Essential (primary) hypertension: Secondary | ICD-10-CM

## 2021-11-30 DIAGNOSIS — E039 Hypothyroidism, unspecified: Secondary | ICD-10-CM

## 2021-11-30 NOTE — Telephone Encounter (Signed)
-----   Message from Velna Hatchet, RT sent at 11/14/2021 11:04 AM EDT ----- ?Regarding: Lab Thu 12/01/21 ?Lab orders needed for appt on 12/01/21, please.  Thanks,  Anda Kraft ? ?

## 2021-12-01 ENCOUNTER — Other Ambulatory Visit (INDEPENDENT_AMBULATORY_CARE_PROVIDER_SITE_OTHER): Payer: Medicare Other

## 2021-12-01 DIAGNOSIS — E559 Vitamin D deficiency, unspecified: Secondary | ICD-10-CM | POA: Diagnosis not present

## 2021-12-01 DIAGNOSIS — E039 Hypothyroidism, unspecified: Secondary | ICD-10-CM

## 2021-12-01 DIAGNOSIS — E7800 Pure hypercholesterolemia, unspecified: Secondary | ICD-10-CM | POA: Diagnosis not present

## 2021-12-01 DIAGNOSIS — E538 Deficiency of other specified B group vitamins: Secondary | ICD-10-CM

## 2021-12-01 DIAGNOSIS — I10 Essential (primary) hypertension: Secondary | ICD-10-CM | POA: Diagnosis not present

## 2021-12-01 DIAGNOSIS — R7303 Prediabetes: Secondary | ICD-10-CM

## 2021-12-01 LAB — COMPREHENSIVE METABOLIC PANEL
ALT: 9 U/L (ref 0–35)
AST: 14 U/L (ref 0–37)
Albumin: 4 g/dL (ref 3.5–5.2)
Alkaline Phosphatase: 107 U/L (ref 39–117)
BUN: 17 mg/dL (ref 6–23)
CO2: 32 mEq/L (ref 19–32)
Calcium: 9 mg/dL (ref 8.4–10.5)
Chloride: 93 mEq/L — ABNORMAL LOW (ref 96–112)
Creatinine, Ser: 0.87 mg/dL (ref 0.40–1.20)
GFR: 65.53 mL/min (ref >60.00)
Glucose, Bld: 96 mg/dL (ref 70–99)
Potassium: 4.2 mEq/L (ref 3.5–5.1)
Sodium: 133 mEq/L — ABNORMAL LOW (ref 135–145)
Total Bilirubin: 0.3 mg/dL (ref 0.2–1.2)
Total Protein: 7.2 g/dL (ref 6.0–8.3)

## 2021-12-01 LAB — CBC WITH DIFFERENTIAL/PLATELET
Basophils Absolute: 0.1 10*3/uL (ref 0.0–0.1)
Basophils Relative: 1 % (ref 0.0–3.0)
Eosinophils Absolute: 0.4 10*3/uL (ref 0.0–0.7)
Eosinophils Relative: 3.8 % (ref 0.0–5.0)
HCT: 29.4 % — ABNORMAL LOW (ref 36.0–46.0)
Hemoglobin: 9.3 g/dL — ABNORMAL LOW (ref 12.0–15.0)
Lymphocytes Relative: 15.6 % (ref 12.0–46.0)
Lymphs Abs: 1.5 10*3/uL (ref 0.7–4.0)
MCHC: 31.7 g/dL (ref 30.0–36.0)
MCV: 75.4 fl — ABNORMAL LOW (ref 78.0–100.0)
Monocytes Absolute: 0.8 10*3/uL (ref 0.1–1.0)
Monocytes Relative: 8.9 % (ref 3.0–12.0)
Neutro Abs: 6.6 10*3/uL (ref 1.4–7.7)
Neutrophils Relative %: 70.7 % (ref 43.0–77.0)
Platelets: 383 10*3/uL (ref 150.0–400.0)
RBC: 3.9 Mil/uL (ref 3.87–5.11)
RDW: 16.9 % — ABNORMAL HIGH (ref 11.5–15.5)
WBC: 9.3 10*3/uL (ref 4.0–10.5)

## 2021-12-01 LAB — LIPID PANEL
Cholesterol: 189 mg/dL (ref 0–200)
HDL: 44.5 mg/dL (ref >39.00)
LDL Cholesterol: 122 mg/dL — ABNORMAL HIGH (ref 0–99)
NonHDL: 144.7
Total CHOL/HDL Ratio: 4
Triglycerides: 114 mg/dL (ref 0.0–149.0)
VLDL: 22.8 mg/dL (ref 0.0–40.0)

## 2021-12-01 LAB — VITAMIN B12: Vitamin B-12: 488 pg/mL (ref 211–911)

## 2021-12-01 LAB — VITAMIN D 25 HYDROXY (VIT D DEFICIENCY, FRACTURES): VITD: 45.59 ng/mL (ref 30.00–100.00)

## 2021-12-01 LAB — HEMOGLOBIN A1C: Hgb A1c MFr Bld: 6.3 % (ref 4.6–6.5)

## 2021-12-01 LAB — TSH: TSH: 4.02 u[IU]/mL (ref 0.35–5.50)

## 2021-12-05 ENCOUNTER — Encounter: Payer: Self-pay | Admitting: Family Medicine

## 2021-12-05 ENCOUNTER — Ambulatory Visit (INDEPENDENT_AMBULATORY_CARE_PROVIDER_SITE_OTHER): Payer: Medicare Other | Admitting: Family Medicine

## 2021-12-05 VITALS — BP 138/72 | HR 86 | Ht 63.5 in | Wt 239.2 lb

## 2021-12-05 DIAGNOSIS — R5382 Chronic fatigue, unspecified: Secondary | ICD-10-CM

## 2021-12-05 DIAGNOSIS — Z1211 Encounter for screening for malignant neoplasm of colon: Secondary | ICD-10-CM | POA: Diagnosis not present

## 2021-12-05 DIAGNOSIS — I10 Essential (primary) hypertension: Secondary | ICD-10-CM | POA: Diagnosis not present

## 2021-12-05 DIAGNOSIS — R600 Localized edema: Secondary | ICD-10-CM | POA: Diagnosis not present

## 2021-12-05 DIAGNOSIS — R1011 Right upper quadrant pain: Secondary | ICD-10-CM

## 2021-12-05 DIAGNOSIS — R7303 Prediabetes: Secondary | ICD-10-CM

## 2021-12-05 DIAGNOSIS — R0602 Shortness of breath: Secondary | ICD-10-CM

## 2021-12-05 DIAGNOSIS — K429 Umbilical hernia without obstruction or gangrene: Secondary | ICD-10-CM | POA: Diagnosis not present

## 2021-12-05 DIAGNOSIS — E66812 Obesity, class 2: Secondary | ICD-10-CM

## 2021-12-05 DIAGNOSIS — R0609 Other forms of dyspnea: Secondary | ICD-10-CM | POA: Insufficient documentation

## 2021-12-05 DIAGNOSIS — E039 Hypothyroidism, unspecified: Secondary | ICD-10-CM

## 2021-12-05 DIAGNOSIS — D649 Anemia, unspecified: Secondary | ICD-10-CM | POA: Diagnosis not present

## 2021-12-05 DIAGNOSIS — K8020 Calculus of gallbladder without cholecystitis without obstruction: Secondary | ICD-10-CM

## 2021-12-05 DIAGNOSIS — E538 Deficiency of other specified B group vitamins: Secondary | ICD-10-CM | POA: Diagnosis not present

## 2021-12-05 DIAGNOSIS — R5383 Other fatigue: Secondary | ICD-10-CM | POA: Insufficient documentation

## 2021-12-05 DIAGNOSIS — Z6838 Body mass index (BMI) 38.0-38.9, adult: Secondary | ICD-10-CM

## 2021-12-05 LAB — IRON: Iron: 23 ug/dL — ABNORMAL LOW (ref 42–145)

## 2021-12-05 MED ORDER — POLYSACCHARIDE IRON COMPLEX 150 MG PO CAPS: 150.0000 mg | ORAL_CAPSULE | Freq: Every day | ORAL | 3 refills | Status: DC

## 2021-12-05 MED ORDER — CYANOCOBALAMIN 1000 MCG/ML IJ SOLN
1000.0000 ug | Freq: Once | INTRAMUSCULAR | Status: AC
  Administered 2021-12-05: 1000 ug via INTRAMUSCULAR

## 2021-12-05 NOTE — Assessment & Plan Note (Addendum)
bp in fair control at this time  ?BP Readings from Last 1 Encounters:  ?12/05/21 138/72  ? ?No changes needed ?Most recent labs reviewed  ?Disc lifstyle change with low sodium diet and exercise  ?Continue hctz 25 mg daily  ?Renal labs are stable, mildly low na at 133 will watch ?

## 2021-12-05 NOTE — Assessment & Plan Note (Signed)
Pt suspects due to fatigue and anemia  ?Also has some pedal edema  ?Lab today incl iron/B12 and BNP ?Nl exam  ? ? ?

## 2021-12-05 NOTE — Assessment & Plan Note (Signed)
Lab Results  ?Component Value Date  ? TSH 4.02 12/01/2021  ? ?Last visit went up to levothyroxine to 75 mcg daily  ?She says her legs have been swelling more since then and more fatigued  ?Unsure if linked  ?

## 2021-12-05 NOTE — Assessment & Plan Note (Addendum)
This is new and constant  ?Some tenderness noted  ?Also fatigue ?Eating less due to it  ? ?[place] abd ordered  ?LFTs normal  ? ?

## 2021-12-05 NOTE — Assessment & Plan Note (Signed)
Pt pt gained more since going up on thyroid dose /interestingly ?

## 2021-12-05 NOTE — Patient Instructions (Addendum)
Take a stool softener ?Miralax (at least daily) also  ? ?I sent in iron  ?It it is not tolerated stop it and let me know  ?We may need to investigate iron infusions ? ?Reach out to your surgeon about the hernia  ? ?Labs today for B12 and iron levels  ? ?B12 shot today  ? ?Elevate your feet when you sit ?Try support socks to the knee  ? ?I ordered an abdominal ultrasound  ?We may need a GI consult also  ? ? ? ? ?

## 2021-12-05 NOTE — Progress Notes (Signed)
? ?Subjective:  ? ? Patient ID: Jennifer Bartlett, female    DOB: Mar 05, 1946, 76 y.o.   MRN: [id number] ? ?HPI ?Pt presents for leg swelling ? ?Wt Readings from Last 3 Encounters:  ?12/05/21 239 lb 3.2 oz (108.5 kg)  ?06/01/21 222 lb 2 oz (100.8 kg)  ?11/26/20 210 lb (95.3 kg)  ? ?41.71 kg/m? ? ?Gaining weight but still eats very well  ?Lost appetite for some foods  ?No longer snacks  ? ?She likes to walk for exercise/ driveway 30 minutes daily  ?Also stairs  ?Then got too tired to do it  ? ?Neg cologuard 2021  ? ?Colonoscopy -last was with her wt loss surgery  ? ?Hernia has been bothering her a lot  ?Umbilical  ?Does not get stuck  ?Just bothers her  ? ? ?Feels like she has been swollen since last thyroid med change (we inc dose after elevated tsh)  ? ?Feels like her abdomen is swelling on the right side  ? ?Cannot get ankle swelling down  ?Feels tight and uncomfortable  ? ?At times she feels like she cannot get her breath  ? ?No new breast lumps on self exam  ? ? ?Fatigue ?Does not feel like herself  ?Very tired ?Not depressed however  ? ?HTN ?bp is stable today  ?No cp or palpitations or headaches or edema  ?No side effects to medicines  ?BP Readings from Last 3 Encounters:  ?12/05/21 138/72  ?06/01/21 138/70  ?09/14/20 (!) 163/76  ?   ?Pulse Readings from Last 3 Encounters:  ?12/05/21 86  ?06/01/21 82  ?09/14/20 100  ? ? ? ?Takes hctz 25 mg daily  ? ?Lab Results  ?Component Value Date  ? CREATININE 0.87 12/01/2021  ? BUN 17 12/01/2021  ? NA 133 (L) 12/01/2021  ? K 4.2 12/01/2021  ? CL 93 (L) 12/01/2021  ? CO2 32 12/01/2021  ? ?Lab Results  ?Component Value Date  ? ALT 9 12/01/2021  ? AST 14 12/01/2021  ? ALKPHOS 107 12/01/2021  ? BILITOT 0.3 12/01/2021  ? ? ? ?Hypothyroid ?Lab Results  ?Component Value Date  ? TSH 4.02 12/01/2021  ? ?Levothyroxine 75 mcg daily  ? ?Takes ppi for GERD ?Omeprazole 20 mg daily  ? ?Anemia  ?Lab Results  ?Component Value Date  ? WBC 9.3 12/01/2021  ? HGB 9.3 (L) 12/01/2021  ? HCT 29.4  (L) 12/01/2021  ? MCV 75.4 (L) 12/01/2021  ? PLT 383.0 12/01/2021  ? ? ?Partial hysterectomy in the past ? ?Prediabetes ?Lab Results  ?Component Value Date  ? HGBA1C 6.3 12/01/2021  ? ?Was taking vit B12  ?Stopped this  ?Stopped the shots  ? ?Had gastric bypass  ? ?Will see Dr Barry Dienes for her umbilical hernia  ? ?Constipated also  ?Drinks lot of water  ? ?Takes vit D  ? ?Patient Active Problem List  ? Diagnosis Date Noted  ? Anemia 12/05/2021  ? Abdominal pain, right upper quadrant 12/05/2021  ? Fatigue 12/05/2021  ? Umbilical hernia 81/85/6314  ? Pedal edema 12/05/2021  ? SOB (shortness of breath) on exertion 12/05/2021  ? Medicare annual wellness visit, subsequent 06/01/2021  ? Current use of proton pump inhibitor 06/01/2021  ? Hypothyroid 01/07/2020  ? History of breast cancer 12/05/2019  ? Prediabetes 07/04/2016  ? Routine general medical examination at a health care facility 07/04/2016  ? Encounter for hepatitis C screening test for low risk patient 06/29/2016  ? Estrogen deficiency 01/11/2016  ? Malignant neoplasm of  upper-inner quadrant of right breast in female, estrogen receptor positive ([place]) 02/26/2015  ? Colon cancer screening 09/06/2012  ? Vitamin D deficiency 12/28/2009  ? STRESS REACTION, ACUTE, WITH EMOTIONAL DISTURBANCE 12/07/2009  ? HYPOGLYCEMIA NOS 05/28/2007  ? Vitamin B12 deficiency 03/07/2007  ? HYPERCHOLESTEROLEMIA 03/07/2007  ? Class 2 severe obesity due to excess calories with serious comorbidity and body mass index (BMI) of 38.0 to 38.9 in adult [facility]) 03/07/2007  ? Essential hypertension 03/07/2007  ? GERD 03/07/2007  ? ?Past Medical History:  ?Diagnosis Date  ? Anemia   ? Anxiety   ? Arthritis   ? hands, knees  ? Breast cancer ([place])   ? Breast cancer of upper-inner quadrant of right female breast ([place]) 02/26/2015  ? Depression   ? GERD (gastroesophageal reflux disease)   ? Hot flashes   ? Hyperlipidemia   ? Hypertension   ? Hypoglycemia   ? Hypothyroidism   ? Obesity   ? Personal history of  radiation therapy   ? Vitamin B 12 deficiency   ? ?Past Surgical History:  ?Procedure Laterality Date  ? ABDOMINAL HYSTERECTOMY    ? BREAST BIOPSY    ? BREAST LUMPECTOMY Right   ? 2016  ? BREAST REDUCTION SURGERY Bilateral 09/14/2020  ? Procedure: MAMMARY REDUCTION  (BREAST);  Surgeon: Irene Limbo, MD;  Location: [facility];  Service: Plastics;  Laterality: Bilateral;  ? GASTRIC BYPASS  08/2003  ? HERNIA REPAIR    ? KNEE SURGERY    ? left  ? PARTIAL HYSTERECTOMY  1990's  ? bleeding  ? RADIOACTIVE SEED GUIDED PARTIAL MASTECTOMY WITH AXILLARY SENTINEL LYMPH NODE BIOPSY Right 03/09/2015  ? Procedure: RADIOACTIVE SEED GUIDED RIGHT PARTIAL MASTECTOMY WITH AXILLARY SENTINEL LYMPH NODE BIOPSY;  Surgeon: Stark Klein, MD;  Location: [facility];  Service: General;  Laterality: Right;  ? REDUCTION MAMMAPLASTY Bilateral 08/2020  ? TUBAL LIGATION    ? ?Social History  ? ?Tobacco Use  ? Smoking status: Never  ? Smokeless tobacco: Never  ?Vaping Use  ? Vaping Use: Never used  ?Substance Use Topics  ? Alcohol use: No  ? Drug use: No  ? ?Family History  ?Problem Relation Age of Onset  ? Heart disease Mother   ?     MI  ? Hypertension Mother   ? Alcohol abuse Father   ? Heart disease Father   ?     smoker  ? Hypertension Father   ? Breast cancer Paternal Grandmother   ? Diabetes Maternal Aunt   ? ?Allergies  ?Allergen Reactions  ? Crestor [Rosuvastatin Calcium] Other (See Comments)  ?  Myalgia   ? Lipitor [Atorvastatin Calcium] Other (See Comments)  ?  Aches and pains  ? Penicillins Shortness Of Breath and Swelling  ? Zocor [Simvastatin - High Dose] Other (See Comments)  ?  Muscle/joint aches  ? ?Current Outpatient Medications on File Prior to Visit  ?Medication Sig Dispense Refill  ? acetaminophen (TYLENOL) 500 MG tablet Take 1,000 mg by mouth every 6 (six) hours as needed.    ? BIOTIN PO Take 1 tablet by mouth daily.    ? CALCIUM PO Take by mouth daily.    ? Cholecalciferol (D3 VITAMIN PO) Take  4,000 Units by mouth daily.    ? hydrochlorothiazide (HYDRODIURIL) 25 MG tablet Take 1 tablet (25 mg total) by mouth daily. 90 tablet 3  ? ketoconazole (NIZORAL) 2 % cream APPLY TOPICALLY DAILY 15 g 0  ? levothyroxine (SYNTHROID) 75 MCG  tablet Take 1 tablet (75 mcg total) by mouth daily. 90 tablet 0  ? loratadine (CLARITIN) 10 MG tablet Take 10 mg by mouth daily.    ? Multiple Vitamin (MULTIVITAMIN) capsule Take 1 capsule by mouth daily.    ? omeprazole (PRILOSEC) 20 MG capsule Take 1 capsule (20 mg total) by mouth daily. 90 capsule 3  ? ?No current facility-administered medications on file prior to visit.  ?  ?Review of Systems  ?Constitutional:  Positive for appetite change, fatigue and unexpected weight change. Negative for activity change and fever.  ?HENT:  Negative for congestion, ear pain, rhinorrhea, sinus pressure and sore throat.   ?Eyes:  Negative for pain, redness and visual disturbance.  ?Respiratory:  Positive for shortness of breath. Negative for cough, wheezing and stridor.   ?Cardiovascular:  Negative for chest pain and palpitations.  ?Gastrointestinal:  Positive for abdominal pain. Negative for abdominal distention, blood in stool, constipation, diarrhea, nausea and vomiting.  ?Endocrine: Negative for polydipsia and polyuria.  ?Genitourinary:  Negative for dysuria, frequency and urgency.  ?Musculoskeletal:  Negative for arthralgias, back pain and myalgias.  ?Skin:  Negative for pallor and rash.  ?Allergic/Immunologic: Negative for environmental allergies.  ?Neurological:  Negative for dizziness, syncope and headaches.  ?Hematological:  Negative for adenopathy. Does not bruise/bleed easily.  ?Psychiatric/Behavioral:  Negative for decreased concentration and dysphoric mood. The patient is hyperactive. The patient is not nervous/anxious.   ? ?   ?Objective:  ? Physical Exam ?Constitutional:   ?   General: She is not in acute distress. ?   Appearance: Normal appearance. She is well-developed. She is  obese. She is not ill-appearing or diaphoretic.  ?HENT:  ?   Head: Normocephalic and atraumatic.  ?   Mouth/Throat:  ?   Mouth: Mucous membranes are moist.  ?   Pharynx: Oropharynx is clear. No posterio

## 2021-12-05 NOTE — Assessment & Plan Note (Signed)
Unsure of cause  ?Also some abd pain and fatigue and anemia  ? ?Labs reviewed  ?BNP ordered to labs today  ? ? ?

## 2021-12-05 NOTE — Assessment & Plan Note (Signed)
Bothersome but no symptoms of incarceration  ? ?She plans to f/u with Dr Barry Dienes ?

## 2021-12-05 NOTE — Assessment & Plan Note (Signed)
In the setting of worsened anemia and hypothyroidism and new R sided abd pain  as well as low B12 in the past (which she stopped treating because it did not make her feel any better)  ?H/o gastric bypass in the past /may not absorb vitamins well  ? ?Lab today for iron studies and B12 ?B12 shot given  ?

## 2021-12-05 NOTE — Assessment & Plan Note (Signed)
B12 level today  ?Stopped it since it did not make her feel better ? ?Shot given today  ?Pending lab result for further plan  ?

## 2021-12-05 NOTE — Assessment & Plan Note (Signed)
Last did cologuard /neg ?

## 2021-12-05 NOTE — Assessment & Plan Note (Signed)
Lab Results  ?Component Value Date  ? HGBA1C 6.3 12/01/2021  ? ?Per pt diet has been more low glycemic  ?

## 2021-12-06 LAB — FERRITIN: Ferritin: 8.1 ng/mL — ABNORMAL LOW (ref 10.0–291.0)

## 2021-12-06 LAB — BRAIN NATRIURETIC PEPTIDE: Pro B Natriuretic peptide (BNP): 132 pg/mL — ABNORMAL HIGH (ref 0.0–100.0)

## 2021-12-06 LAB — VITAMIN B12: Vitamin B-12: 473 pg/mL (ref 211–911)

## 2021-12-08 ENCOUNTER — Ambulatory Visit
Admission: RE | Admit: 2021-12-08 | Discharge: 2021-12-08 | Disposition: A | Discharge status: Home / Self Care | Payer: Medicare Other | Source: Ambulatory Visit | Attending: Family Medicine | Admitting: Family Medicine

## 2021-12-08 DIAGNOSIS — K760 Fatty (change of) liver, not elsewhere classified: Secondary | ICD-10-CM | POA: Diagnosis not present

## 2021-12-08 DIAGNOSIS — K8020 Calculus of gallbladder without cholecystitis without obstruction: Secondary | ICD-10-CM | POA: Insufficient documentation

## 2021-12-08 DIAGNOSIS — K828 Other specified diseases of gallbladder: Secondary | ICD-10-CM | POA: Diagnosis not present

## 2021-12-08 NOTE — Addendum Note (Signed)
Addended by: Loura Pardon A on: 12/08/2021 04:39 PM ? ? Modules accepted: Orders ? ?

## 2021-12-08 NOTE — Assessment & Plan Note (Signed)
Gallstones and sludge seen on [place] ?RUQ pain  ?Referral made to gen surg  ? ?LFTs were in the normal range  ?

## 2021-12-12 ENCOUNTER — Telehealth: Payer: Self-pay | Admitting: Family Medicine

## 2021-12-12 NOTE — Telephone Encounter (Signed)
Called and spoke w/ pt informed that her referral has been sent to [facility] and their office will be call her regarding an appt.  ?

## 2021-12-12 NOTE — Telephone Encounter (Signed)
Pt called seeing "when her surgery would be for her gallbladder and what to do next." She wants to speak more with the nurse about this. ? ?Callback Number: [phone] ?

## 2021-12-17 ENCOUNTER — Encounter: Payer: Self-pay | Admitting: Family Medicine

## 2021-12-20 ENCOUNTER — Telehealth: Payer: Self-pay | Admitting: Family Medicine

## 2021-12-20 DIAGNOSIS — D508 Other iron deficiency anemias: Secondary | ICD-10-CM

## 2021-12-20 NOTE — Telephone Encounter (Signed)
-----   Message from Ellamae Sia sent at 12/08/2021  3:32 PM EDT ----- Regarding: Lab orders for Wednesday, 5.24.23 Lab orders, thanks, T

## 2021-12-21 ENCOUNTER — Other Ambulatory Visit (INDEPENDENT_AMBULATORY_CARE_PROVIDER_SITE_OTHER): Payer: Medicare Other

## 2021-12-21 DIAGNOSIS — D508 Other iron deficiency anemias: Secondary | ICD-10-CM

## 2021-12-21 LAB — CBC WITH DIFFERENTIAL/PLATELET
Basophils Absolute: 0.1 10*3/uL (ref 0.0–0.1)
Basophils Relative: 1 % (ref 0.0–3.0)
Eosinophils Absolute: 0.3 10*3/uL (ref 0.0–0.7)
Eosinophils Relative: 4.2 % (ref 0.0–5.0)
HCT: 28.8 % — ABNORMAL LOW (ref 36.0–46.0)
Hemoglobin: 9.2 g/dL — ABNORMAL LOW (ref 12.0–15.0)
Lymphocytes Relative: 17.7 % (ref 12.0–46.0)
Lymphs Abs: 1.5 10*3/uL (ref 0.7–4.0)
MCHC: 32.1 g/dL (ref 30.0–36.0)
MCV: 75.2 fl — ABNORMAL LOW (ref 78.0–100.0)
Monocytes Absolute: 0.7 10*3/uL (ref 0.1–1.0)
Monocytes Relative: 8 % (ref 3.0–12.0)
Neutro Abs: 5.7 10*3/uL (ref 1.4–7.7)
Neutrophils Relative %: 69.1 % (ref 43.0–77.0)
Platelets: 373 10*3/uL (ref 150.0–400.0)
RBC: 3.84 Mil/uL — ABNORMAL LOW (ref 3.87–5.11)
RDW: 17.7 % — ABNORMAL HIGH (ref 11.5–15.5)
WBC: 8.2 10*3/uL (ref 4.0–10.5)

## 2021-12-21 LAB — FERRITIN: Ferritin: 6.4 ng/mL — ABNORMAL LOW (ref 10.0–291.0)

## 2021-12-21 LAB — IRON: Iron: 33 ug/dL — ABNORMAL LOW (ref 42–145)

## 2021-12-22 ENCOUNTER — Encounter: Payer: Self-pay | Admitting: Family Medicine

## 2021-12-22 NOTE — Progress Notes (Signed)
Lab Results  Component Value Date   WBC 8.2 12/21/2021   HGB 9.2 (L) 12/21/2021   HCT 28.8 (L) 12/21/2021   MCV 75.2 (L) 12/21/2021   PLT 373.0 12/21/2021   Lab Results  Component Value Date   IRON 33 (L) 12/21/2021   FERRITIN 6.4 (L) 12/21/2021    Hematology referral

## 2021-12-22 NOTE — Addendum Note (Signed)
Addended by: Loura Pardon A on: 12/22/2021 05:13 PM   Modules accepted: Orders

## 2021-12-27 ENCOUNTER — Telehealth: Payer: Self-pay | Admitting: Physician Assistant

## 2021-12-27 NOTE — Telephone Encounter (Signed)
Scheduled appt per 5/26 referral. Pt is aware of appt date and time. Pt is aware to arrive 15 mins prior to appt time and to bring and updated insurance card. Pt is aware of appt location.   

## 2021-12-30 ENCOUNTER — Encounter: Payer: Self-pay | Admitting: Family Medicine

## 2022-01-09 NOTE — Progress Notes (Signed)
[facility] [phone]   [phone] [place] NOTE  Patient Care Team: Tower, Wynelle Fanny, MD as PCP - General Annamaria Boots Candiss Norse, NP (Inactive) as Nurse Practitioner (Obstetrics and Gynecology) Stark Klein, MD as Consulting Physician (General Surgery) Magrinat, Virgie Dad, MD (Inactive) as Consulting Physician (Oncology) Mauro Kaufmann, RN as Registered Nurse Rockwell Germany, RN as Registered Nurse Jake Shark, Johny Blamer, NP as Nurse Practitioner (Nurse Practitioner) Vickey Huger, MD as Consulting Physician (Orthopedic Surgery)  CHIEF COMPLAINTS/PURPOSE OF CONSULTATION:  Iron deficiency anemia  HISTORY OF PRESENTING ILLNESS:  Jennifer Bartlett 76 y.o. female with medical history significant for gastric bypass surgery, breast cancer hyperlipidemia, hypertension, hypothyroidism and GERD. She presents to the clinic for initial evaluation for iron deficiency anemia. She is accompanied by her husband for this visit.   On review of the previous records, there is evidence of anemia starting November 2019. Most recent labs from 12/21/2021 showed progressive iron deficiency anemia with Hgb 9.2, MCV 75.2, Iron 33 and Ferritin 6.4. Patient started iron supplementation in the beginning of May 2023.   On exam today, Jennifer Bartlett reports constant fatigue that does not improve with rest. Her appetite is unchanged and she denies any dietary restrictions. She denies any GI symptoms including nausea, vomiting, diarrhea or constipation. She denies easy bruising or signs of active bleeding. She reports shortness of breath with exertion. She denies fevers, chills, sweats, chest pain or cough. She has no other complaints. Rest of the 10 point ROS is below.   MEDICAL HISTORY:  Past Medical History:  Diagnosis Date   Anemia    Anxiety    Arthritis    hands, knees   Breast cancer ([place])    Breast cancer of upper-inner quadrant of right female breast ([place]) 02/26/2015    Depression    GERD (gastroesophageal reflux disease)    Hot flashes    Hyperlipidemia    Hypertension    Hypoglycemia    Hypothyroidism    Obesity    Personal history of radiation therapy    Vitamin B 12 deficiency     SURGICAL HISTORY: Past Surgical History:  Procedure Laterality Date   ABDOMINAL HYSTERECTOMY     BREAST BIOPSY     BREAST LUMPECTOMY Right    2016   BREAST REDUCTION SURGERY Bilateral 09/14/2020   Procedure: MAMMARY REDUCTION  (BREAST);  Surgeon:  Limbo, MD;  Location: [facility];  Service: Plastics;  Laterality: Bilateral;   GASTRIC BYPASS  08/2003   KNEE SURGERY     left   PARTIAL HYSTERECTOMY  1990's   bleeding   RADIOACTIVE SEED GUIDED PARTIAL MASTECTOMY WITH AXILLARY SENTINEL LYMPH NODE BIOPSY Right 03/09/2015   Procedure: RADIOACTIVE SEED GUIDED RIGHT PARTIAL MASTECTOMY WITH AXILLARY SENTINEL LYMPH NODE BIOPSY;  Surgeon: Stark Klein, MD;  Location: [facility];  Service: General;  Laterality: Right;   REDUCTION MAMMAPLASTY Bilateral 08/2020   TUBAL LIGATION      SOCIAL HISTORY: Social History   Socioeconomic History   Marital status: Married    Spouse name: Not on file   Number of children: Not on file   Years of education: Not on file   Highest education level: Not on file  Occupational History    Employer: [employer]  Tobacco Use   Smoking status: Never   Smokeless tobacco: Never  Vaping Use   Vaping Use: Never used  Substance and Sexual Activity   Alcohol use: No   Drug  use: No   Sexual activity: Yes    Partners: Male    Birth control/protection: Surgical    Comment: Hyst.  Other Topics Concern   Not on file  Social History Narrative   Regular walking for exercise.   Social Determinants of Health   Financial Resource Strain: Low Risk  (03/26/2020)   Overall Financial Resource Strain (CARDIA)    Difficulty of Paying Living Expenses: Not hard at all  Food Insecurity: No Food  Insecurity (03/26/2020)   Hunger Vital Sign    Worried About Running Out of Food in the Last Year: Never true    Ran Out of Food in the Last Year: Never true  Transportation Needs: No Transportation Needs (03/26/2020)   PRAPARE - Hydrologist (Medical): No    Lack of Transportation (Non-Medical): No  Physical Activity: Insufficiently Active (03/26/2020)   Exercise Vital Sign    Days of Exercise per Week: 3 days    Minutes of Exercise per Session: 20 min  Stress: No Stress Concern Present (03/26/2020)   [place]    Feeling of Stress : Not at all  Social Connections: Not on file  Intimate Partner Violence: Not At Risk (03/26/2020)   Humiliation, Afraid, Rape, and Kick questionnaire    Fear of Current or Ex-Partner: No    Emotionally Abused: No    Physically Abused: No    Sexually Abused: No    FAMILY HISTORY: Family History  Problem Relation Age of Onset   Heart disease Mother        MI   Hypertension Mother    Alcohol abuse Father    Heart disease Father        smoker   Hypertension Father    Breast cancer Paternal Grandmother    Diabetes Maternal Aunt     ALLERGIES:  is allergic to crestor [rosuvastatin calcium], lipitor [atorvastatin calcium], penicillins, and zocor [simvastatin - high dose].  MEDICATIONS:  Current Outpatient Medications  Medication Sig Dispense Refill   acetaminophen (TYLENOL) 500 MG tablet Take 1,000 mg by mouth every 6 (six) hours as needed.     BIOTIN PO Take 1 tablet by mouth daily.     CALCIUM PO Take by mouth daily.     Cholecalciferol (D3 VITAMIN PO) Take 4,000 Units by mouth daily.     hydrochlorothiazide (HYDRODIURIL) 25 MG tablet Take 1 tablet (25 mg total) by mouth daily. 90 tablet 3   iron polysaccharides (NIFEREX) 150 MG capsule Take 1 capsule (150 mg total) by mouth daily. 30 capsule 3   ketoconazole (NIZORAL) 2 % cream APPLY TOPICALLY DAILY 15 g  0   levothyroxine (SYNTHROID) 75 MCG tablet Take 1 tablet (75 mcg total) by mouth daily. 90 tablet 0   loratadine (CLARITIN) 10 MG tablet Take 10 mg by mouth daily.     Multiple Vitamin (MULTIVITAMIN) capsule Take 1 capsule by mouth daily.     omeprazole (PRILOSEC) 20 MG capsule Take 1 capsule (20 mg total) by mouth daily. 90 capsule 3   No current facility-administered medications for this visit.    REVIEW OF SYSTEMS:   Constitutional: ( - ) fevers, ( - )  chills , ( - ) night sweats Eyes: ( - ) blurriness of vision, ( - ) double vision, ( - ) watery eyes Ears, nose, mouth, throat, and face: ( - ) mucositis, ( - ) sore throat Respiratory: ( - ) cough, ( + )  dyspnea, ( - ) wheezes Cardiovascular: ( - ) palpitation, ( - ) chest discomfort, ( - ) lower extremity swelling Gastrointestinal:  ( - ) nausea, ( - ) heartburn, ( - ) change in bowel habits Skin: ( - ) abnormal skin rashes Lymphatics: ( - ) new lymphadenopathy, ( - ) easy bruising Neurological: ( - ) numbness, ( - ) tingling, ( - ) new weaknesses Behavioral/Psych: ( - ) mood change, ( - ) new changes  All other systems were reviewed with the patient and are negative.  PHYSICAL EXAMINATION: ECOG PERFORMANCE STATUS: 1 - Symptomatic but completely ambulatory  Vitals:   01/10/22 1108  BP: (!) 197/76  Pulse: 83  Resp: 16  Temp: 98.1 F (36.7 C)  SpO2: 96%   Filed Weights   01/10/22 1108  Weight: 245 lb 14.4 oz (111.5 kg)    GENERAL: well appearing female in NAD  SKIN: skin color, texture, turgor are normal, no rashes or significant lesions EYES: conjunctiva are pink and non-injected, sclera clear OROPHARYNX: no exudate, no erythema; lips, buccal mucosa, and tongue normal  NECK: supple, non-tender LYMPH:  no palpable lymphadenopathy in the cervical, supraclavicular lymph nodes.  LUNGS: clear to auscultation and percussion with normal breathing effort HEART: regular rate & rhythm and no murmurs and no lower extremity  edema ABDOMEN: soft, non-tender, non-distended, normal bowel sounds Musculoskeletal: no cyanosis of digits and no clubbing  PSYCH: alert & oriented x 3, fluent speech NEURO: no focal motor/sensory deficits  LABORATORY DATA:  I have reviewed the data as listed    Latest Ref Rng & Units 01/10/2022   12:10 PM 12/21/2021    9:26 AM 12/01/2021   10:37 AM  CBC  WBC 4.0 - 10.5 K/uL 9.0  8.2  9.3   Hemoglobin 12.0 - 15.0 g/dL 9.4  9.2  9.3   Hematocrit 36.0 - 46.0 % 30.9  28.8  29.4   Platelets 150 - 400 K/uL 396  373.0  383.0        Latest Ref Rng & Units 12/01/2021   10:37 AM 06/01/2021   10:21 AM 09/10/2020    8:30 AM  CMP  Glucose 70 - 99 mg/dL 96  119  125   BUN 6 - 23 mg/dL '17  18  16   '$ Creatinine 0.40 - 1.20 mg/dL 0.87  0.91  0.90   Sodium 135 - 145 mEq/L 133  137  140   Potassium 3.5 - 5.1 mEq/L 4.2  4.3  3.8   Chloride 96 - 112 mEq/L 93  98  104   CO2 19 - 32 mEq/L 32  31  25   Calcium 8.4 - 10.5 mg/dL 9.0  8.9  8.7   Total Protein 6.0 - 8.3 g/dL 7.2  7.1    Total Bilirubin 0.2 - 1.2 mg/dL 0.3  0.3    Alkaline Phos 39 - 117 U/L 107  95    AST 0 - 37 U/L 14  15    ALT 0 - 35 U/L 9  10      ASSESSMENT & PLAN Jennifer Bartlett is a 76 y.o. female who presents to the clinic for iron deficiency anemia. We reviewed underlying causes including malabsorption, blood loss and inadequate dietary intake. Patient has history of bartiatric surgery over 25 years ago which will cause malabsorption of iron. Patient denies any signs of active bleeding. She undergoes colon cancer screenings with cologuard, most recently completed in June 2021.   Recommend to proceed  with IV iron infusions as patient will unlikely absorb iron pills. Additionally, I recommend a referral to gastroenterology to rule out GI bleed as underlying cause.   #Iron deficiency anemia: --Secondary to malasorbption due to bariatric surgery --Need referral to GI to rule out GI bleeding --Currently on iron pills, okay to  discontinue.  --Recommend IV iron infusion, patient prefers [facility] infusion center.  --RTC in 3 months with labs  #H/O bariatric surgery: --Will check iron and B12 levels today  #H/O breast cancer: --Medical oncologist was Dr. Jana Hakim. Completed treatment in October 2016 and completed 5 years of Tamoxifen therapy in January 2022.  --Currently on surveillance with PCP.  --Last mammogram was 06/2021 which was negative for malignancy  Orders Placed This Encounter  Procedures   CBC with Differential ([facility] Only)    Standing Status:   Future    Number of Occurrences:   1    Standing Expiration Date:   01/10/2023   CMP ([facility] only)    Standing Status:   Future    Number of Occurrences:   1    Standing Expiration Date:   01/10/2023   Ferritin    Standing Status:   Future    Number of Occurrences:   1    Standing Expiration Date:   01/10/2023   Iron and Iron Binding Capacity ([facility] only)    Standing Status:   Future    Number of Occurrences:   1    Standing Expiration Date:   01/10/2023   Retic Panel    Standing Status:   Future    Number of Occurrences:   1    Standing Expiration Date:   01/10/2023   Vitamin B12    Standing Status:   Future    Number of Occurrences:   1    Standing Expiration Date:   01/09/2023   Methylmalonic acid, serum    Standing Status:   Future    Number of Occurrences:   1    Standing Expiration Date:   01/09/2023    All questions were answered. The patient knows to call the clinic with any problems, questions or concerns.  I have spent a total of 45 minutes minutes of face-to-face and non-face-to-face time, preparing to see the patient, obtaining and/or reviewing separately obtained history, performing a medically appropriate examination, counseling and educating the patient, ordering medications/tests/procedures, referring and communicating with other health care professionals, documenting clinical information in the electronic health  record, independently interpreting results and communicating results to the patient, and care coordination.   Dede Query, PA-C Department of Hematology/Oncology [place] at [facility] Phone: [phone]  Patient was seen with Dr. Lorenso Courier  I have read the above note and personally examined the patient. I agree with the assessment and plan as noted above.  Briefly Jennifer Bartlett is a 76 year old female who presents for evaluation of iron deficiency anemia secondary to gastric bypass surgery.  The patient was previously followed by Dr. Jana Hakim for breast cancer but has entered survivorship.  She has been taking p.o. iron therapy without much success in raising her hemoglobin levels.  At this time would recommend we pursue IV iron therapy in the setting of gastric bypass surgery.  The patient voiced understanding of the plan moving forward.  We will plan to see her back in approximately 4 to 6 weeks after last dose IV iron in order to assure her levels have increased.   Ledell Peoples, MD Department  of Hematology/Oncology [place] at [facility] Phone: [phone] Pager: [phone] Email: [email or web address]

## 2022-01-10 ENCOUNTER — Encounter: Payer: Self-pay | Admitting: Physician Assistant

## 2022-01-10 ENCOUNTER — Other Ambulatory Visit: Payer: Self-pay

## 2022-01-10 ENCOUNTER — Inpatient Hospital Stay: Payer: Medicare Other | Attending: Physician Assistant | Admitting: Physician Assistant

## 2022-01-10 ENCOUNTER — Inpatient Hospital Stay: Payer: Medicare Other

## 2022-01-10 VITALS — BP 197/76 | HR 83 | Temp 98.1°F | Resp 16 | Wt 245.9 lb

## 2022-01-10 DIAGNOSIS — E039 Hypothyroidism, unspecified: Secondary | ICD-10-CM | POA: Insufficient documentation

## 2022-01-10 DIAGNOSIS — Z923 Personal history of irradiation: Secondary | ICD-10-CM | POA: Insufficient documentation

## 2022-01-10 DIAGNOSIS — D509 Iron deficiency anemia, unspecified: Secondary | ICD-10-CM

## 2022-01-10 DIAGNOSIS — K219 Gastro-esophageal reflux disease without esophagitis: Secondary | ICD-10-CM | POA: Diagnosis not present

## 2022-01-10 DIAGNOSIS — E785 Hyperlipidemia, unspecified: Secondary | ICD-10-CM | POA: Diagnosis not present

## 2022-01-10 DIAGNOSIS — Z853 Personal history of malignant neoplasm of breast: Secondary | ICD-10-CM | POA: Insufficient documentation

## 2022-01-10 DIAGNOSIS — I10 Essential (primary) hypertension: Secondary | ICD-10-CM | POA: Insufficient documentation

## 2022-01-10 LAB — CMP (CANCER CENTER ONLY)
ALT: 12 U/L (ref 0–44)
AST: 15 U/L (ref 15–41)
Albumin: 4 g/dL (ref 3.5–5.0)
Alkaline Phosphatase: 104 U/L (ref 38–126)
Anion gap: 7 (ref 5–15)
BUN: 16 mg/dL (ref 8–23)
CO2: 31 mmol/L (ref 22–32)
Calcium: 9.5 mg/dL (ref 8.9–10.3)
Chloride: 98 mmol/L (ref 98–111)
Creatinine: 0.89 mg/dL (ref 0.44–1.00)
GFR, Estimated: >60 mL/min (ref >60)
Glucose, Bld: 119 mg/dL — ABNORMAL HIGH (ref 70–99)
Potassium: 4.1 mmol/L (ref 3.5–5.1)
Sodium: 136 mmol/L (ref 135–145)
Total Bilirubin: 0.3 mg/dL (ref 0.3–1.2)
Total Protein: 7.9 g/dL (ref 6.5–8.1)

## 2022-01-10 LAB — CBC WITH DIFFERENTIAL (CANCER CENTER ONLY)
Abs Immature Granulocytes: 0.03 10*3/uL (ref 0.00–0.07)
Basophils Absolute: 0.1 10*3/uL (ref 0.0–0.1)
Basophils Relative: 1 %
Eosinophils Absolute: 0.3 10*3/uL (ref 0.0–0.5)
Eosinophils Relative: 4 %
HCT: 30.9 % — ABNORMAL LOW (ref 36.0–46.0)
Hemoglobin: 9.4 g/dL — ABNORMAL LOW (ref 12.0–15.0)
Immature Granulocytes: 0 %
Lymphocytes Relative: 17 %
Lymphs Abs: 1.5 10*3/uL (ref 0.7–4.0)
MCH: 23.6 pg — ABNORMAL LOW (ref 26.0–34.0)
MCHC: 30.4 g/dL (ref 30.0–36.0)
MCV: 77.6 fL — ABNORMAL LOW (ref 80.0–100.0)
Monocytes Absolute: 0.7 10*3/uL (ref 0.1–1.0)
Monocytes Relative: 7 %
Neutro Abs: 6.4 10*3/uL (ref 1.7–7.7)
Neutrophils Relative %: 71 %
Platelet Count: 396 10*3/uL (ref 150–400)
RBC: 3.98 MIL/uL (ref 3.87–5.11)
RDW: 17.4 % — ABNORMAL HIGH (ref 11.5–15.5)
WBC Count: 9 10*3/uL (ref 4.0–10.5)
nRBC: 0 % (ref 0.0–0.2)

## 2022-01-10 LAB — IRON AND IRON BINDING CAPACITY (CC-WL,HP ONLY)
Iron: 37 ug/dL (ref 28–170)
Saturation Ratios: 6 % — ABNORMAL LOW (ref 10.4–31.8)
TIBC: 598 ug/dL — ABNORMAL HIGH (ref 250–450)
UIBC: 561 ug/dL — ABNORMAL HIGH (ref 148–442)

## 2022-01-10 LAB — RETIC PANEL
Immature Retic Fract: 27.4 % — ABNORMAL HIGH (ref 2.3–15.9)
RBC.: 3.9 MIL/uL (ref 3.87–5.11)
Retic Count, Absolute: 65.9 10*3/uL (ref 19.0–186.0)
Retic Ct Pct: 1.7 % (ref 0.4–3.1)
Reticulocyte Hemoglobin: 26.8 pg — ABNORMAL LOW (ref >27.9)

## 2022-01-10 LAB — VITAMIN B12: Vitamin B-12: 913 pg/mL (ref 180–914)

## 2022-01-10 LAB — FERRITIN: Ferritin: 7 ng/mL — ABNORMAL LOW (ref 11–307)

## 2022-01-11 ENCOUNTER — Encounter: Payer: Self-pay | Admitting: Physician Assistant

## 2022-01-11 ENCOUNTER — Telehealth: Payer: Self-pay

## 2022-01-11 ENCOUNTER — Other Ambulatory Visit: Payer: Self-pay | Admitting: Family Medicine

## 2022-01-11 DIAGNOSIS — D509 Iron deficiency anemia, unspecified: Secondary | ICD-10-CM | POA: Insufficient documentation

## 2022-01-11 NOTE — Telephone Encounter (Signed)
-----   Message from Lincoln Brigham, PA-C sent at 01/11/2022  6:21 AM EDT ----- Please notify patient that iron levels continue to show deficiency so we will arrange for IV iron infusions.

## 2022-01-11 NOTE — Telephone Encounter (Signed)
LM for pt with lab results and recommendations. 

## 2022-01-12 LAB — METHYLMALONIC ACID, SERUM: Methylmalonic Acid, Quantitative: 187 nmol/L (ref 0–378)

## 2022-01-17 ENCOUNTER — Encounter: Payer: Self-pay | Admitting: Physician Assistant

## 2022-01-18 ENCOUNTER — Other Ambulatory Visit: Payer: Self-pay

## 2022-01-18 ENCOUNTER — Inpatient Hospital Stay: Payer: Medicare Other

## 2022-01-18 VITALS — BP 163/81 | HR 79 | Temp 97.9°F | Resp 18 | Wt 229.0 lb

## 2022-01-18 DIAGNOSIS — K219 Gastro-esophageal reflux disease without esophagitis: Secondary | ICD-10-CM | POA: Diagnosis not present

## 2022-01-18 DIAGNOSIS — Z853 Personal history of malignant neoplasm of breast: Secondary | ICD-10-CM | POA: Diagnosis not present

## 2022-01-18 DIAGNOSIS — D509 Iron deficiency anemia, unspecified: Secondary | ICD-10-CM | POA: Diagnosis not present

## 2022-01-18 DIAGNOSIS — E039 Hypothyroidism, unspecified: Secondary | ICD-10-CM | POA: Diagnosis not present

## 2022-01-18 DIAGNOSIS — I10 Essential (primary) hypertension: Secondary | ICD-10-CM | POA: Diagnosis not present

## 2022-01-18 DIAGNOSIS — E785 Hyperlipidemia, unspecified: Secondary | ICD-10-CM | POA: Diagnosis not present

## 2022-01-18 DIAGNOSIS — Z923 Personal history of irradiation: Secondary | ICD-10-CM | POA: Diagnosis not present

## 2022-01-18 MED ORDER — SODIUM CHLORIDE 0.9 % IV SOLN
510.0000 mg | Freq: Once | INTRAVENOUS | Status: AC
  Administered 2022-01-18: 510 mg via INTRAVENOUS
  Filled 2022-01-18: qty 510

## 2022-01-18 MED ORDER — SODIUM CHLORIDE 0.9 % IV SOLN: Freq: Once | INTRAVENOUS | Status: AC

## 2022-01-18 NOTE — Patient Instructions (Signed)

## 2022-01-18 NOTE — Progress Notes (Signed)
Pt tolerated first infusion on faraheme with no complications. Pt vital signs stable after 30 minute post infusion wait. Ambulated to lobby in stable condition.

## 2022-01-25 ENCOUNTER — Inpatient Hospital Stay: Payer: Medicare Other

## 2022-01-25 ENCOUNTER — Other Ambulatory Visit: Payer: Self-pay

## 2022-01-25 VITALS — BP 178/70 | HR 74 | Temp 97.9°F | Resp 20

## 2022-01-25 DIAGNOSIS — E785 Hyperlipidemia, unspecified: Secondary | ICD-10-CM | POA: Diagnosis not present

## 2022-01-25 DIAGNOSIS — Z923 Personal history of irradiation: Secondary | ICD-10-CM | POA: Diagnosis not present

## 2022-01-25 DIAGNOSIS — I10 Essential (primary) hypertension: Secondary | ICD-10-CM | POA: Diagnosis not present

## 2022-01-25 DIAGNOSIS — Z853 Personal history of malignant neoplasm of breast: Secondary | ICD-10-CM | POA: Diagnosis not present

## 2022-01-25 DIAGNOSIS — K219 Gastro-esophageal reflux disease without esophagitis: Secondary | ICD-10-CM | POA: Diagnosis not present

## 2022-01-25 DIAGNOSIS — D509 Iron deficiency anemia, unspecified: Secondary | ICD-10-CM | POA: Diagnosis not present

## 2022-01-25 DIAGNOSIS — E039 Hypothyroidism, unspecified: Secondary | ICD-10-CM | POA: Diagnosis not present

## 2022-01-25 MED ORDER — SODIUM CHLORIDE 0.9 % IV SOLN
510.0000 mg | Freq: Once | INTRAVENOUS | Status: AC
  Administered 2022-01-25: 510 mg via INTRAVENOUS
  Filled 2022-01-25: qty 510

## 2022-01-25 MED ORDER — SODIUM CHLORIDE 0.9 % IV SOLN: Freq: Once | INTRAVENOUS | Status: AC

## 2022-01-25 NOTE — Patient Instructions (Signed)

## 2022-02-10 ENCOUNTER — Other Ambulatory Visit: Payer: Self-pay | Admitting: General Surgery

## 2022-02-10 DIAGNOSIS — K429 Umbilical hernia without obstruction or gangrene: Secondary | ICD-10-CM | POA: Diagnosis not present

## 2022-02-10 DIAGNOSIS — K8010 Calculus of gallbladder with chronic cholecystitis without obstruction: Secondary | ICD-10-CM | POA: Diagnosis not present

## 2022-02-10 DIAGNOSIS — Z853 Personal history of malignant neoplasm of breast: Secondary | ICD-10-CM | POA: Diagnosis not present

## 2022-02-21 ENCOUNTER — Telehealth: Payer: Self-pay | Admitting: Family Medicine

## 2022-02-21 NOTE — Telephone Encounter (Signed)
So glad to hear that she got it scheduled.  Let us know if she needs anything

## 2022-02-21 NOTE — Telephone Encounter (Signed)
Patient called just and wanted to let Dr Glori Bickers know that she has an appt to have her Gallbladder and hernia removed when 03/09/22 at the hospital at 11am.

## 2022-03-01 NOTE — Pre-Procedure Instructions (Signed)
Surgical Instructions    Your procedure is scheduled on March 09, 2022.  Report to [facility] Main Entrance "A" at 11:00 A.M., then check in with the Admitting office.  Call this number if you have problems the morning of surgery:  [phone]   If you have any questions prior to your surgery date call [phone]: Open Monday-Friday 8am-4pm    Remember:  Do not eat after midnight the night before your surgery  You may drink clear liquids until 10:00 AM the morning of your surgery.   Clear liquids allowed are: Water, Non-Citrus Juices (without pulp), Carbonated Beverages, Clear Tea, Black Coffee Only (NO MILK, CREAM OR POWDERED CREAMER of any kind), and Gatorade.  Patient Instructions  The night before surgery:  No food after midnight. ONLY clear liquids after midnight  The day of surgery (if you do NOT have diabetes):  Drink ONE (1) Pre-Surgery Clear Ensure by 10:00 AM the morning of surgery. Drink in one sitting. Do not sip.  This drink was given to you during your hospital  pre-op appointment visit.  Nothing else to drink after completing the  Pre-Surgery Clear Ensure.         If you have questions, please contact your surgeon's office.     Take these medicines the morning of surgery with A SIP OF WATER:  levothyroxine (SYNTHROID)  omeprazole (PRILOSEC)   acetaminophen (TYLENOL) - may take as needed  loratadine (CLARITIN) - may take as needed    As of today, STOP taking any Aspirin (unless otherwise instructed by your surgeon) Aleve, Naproxen, Ibuprofen, Motrin, Advil, Goody's, BC's, all herbal medications, fish oil, and all vitamins. This includes your medication: diclofenac Sodium (VOLTAREN)                     Do NOT Smoke (Tobacco/Vaping) for 24 hours prior to your procedure.  If you use a CPAP at night, you may bring your mask/headgear for your overnight stay.   Contacts, glasses, piercing's, hearing aid's, dentures or partials may not be worn into surgery,  please bring cases for these belongings.    For patients admitted to the hospital, discharge time will be determined by your treatment team.   Patients discharged the day of surgery will not be allowed to drive home, and someone needs to stay with them for 24 hours.  SURGICAL WAITING ROOM VISITATION Patients having surgery or a procedure may have no more than 2 support people in the waiting area - these visitors may rotate.   Children under the age of 73 must have an adult with them who is not the patient. If the patient needs to stay at the hospital during part of their recovery, the visitor guidelines for inpatient rooms apply. Pre-op nurse will coordinate an appropriate time for 1 support person to accompany patient in pre-op.  This support person may not rotate.   Please refer to the [facility] website for the visitor guidelines for Inpatients (after your surgery is over and you are in a regular room).    Special instructions:   [place]- Preparing For Surgery  Before surgery, you can play an important role. Because skin is not sterile, your skin needs to be as free of germs as possible. You can reduce the number of germs on your skin by washing with CHG (chlorahexidine gluconate) Soap before surgery.  CHG is an antiseptic cleaner which kills germs and bonds with the skin to continue killing germs even after washing.  Oral Hygiene is also important to reduce your risk of infection.  Remember - BRUSH YOUR TEETH THE MORNING OF SURGERY WITH YOUR REGULAR TOOTHPASTE  Please do not use if you have an allergy to CHG or antibacterial soaps. If your skin becomes reddened/irritated stop using the CHG.  Do not shave (including legs and underarms) for at least 48 hours prior to first CHG shower. It is OK to shave your face.  Please follow these instructions carefully.   Shower the NIGHT BEFORE SURGERY and the MORNING OF SURGERY  If you chose to wash your hair, wash your hair first as usual  with your normal shampoo.  After you shampoo, rinse your hair and body thoroughly to remove the shampoo.  Use CHG Soap as you would any other liquid soap. You can apply CHG directly to the skin and wash gently with a scrungie or a clean washcloth.   Apply the CHG Soap to your body ONLY FROM THE NECK DOWN.  Do not use on open wounds or open sores. Avoid contact with your eyes, ears, mouth and genitals (private parts). Wash Face and genitals (private parts)  with your normal soap.   Wash thoroughly, paying special attention to the area where your surgery will be performed.  Thoroughly rinse your body with warm water from the neck down.  DO NOT shower/wash with your normal soap after using and rinsing off the CHG Soap.  Pat yourself dry with a CLEAN TOWEL.  Wear CLEAN PAJAMAS to bed the night before surgery  Place CLEAN SHEETS on your bed the night before your surgery  DO NOT SLEEP WITH PETS.   Day of Surgery: Take a shower with CHG soap. Do not wear jewelry or makeup Do not wear lotions, powders, perfumes/colognes, or deodorant. Do not shave 48 hours prior to surgery.   Do not bring valuables to the hospital.  [facility] is not responsible for any belongings or valuables. Do not wear nail polish, gel polish, artificial nails, or any other type of covering on natural nails (fingers and toes) If you have artificial nails or gel coating that need to be removed by a nail salon, please have this removed prior to surgery. Artificial nails or gel coating may interfere with anesthesia's ability to adequately monitor your vital signs.  Wear Clean/Comfortable clothing the morning of surgery Remember to brush your teeth WITH YOUR REGULAR TOOTHPASTE.   Please read over the following fact sheets that you were given.    If you received a COVID test during your pre-op visit  it is requested that you wear a mask when out in public, stay away from anyone that may not be feeling well and notify  your surgeon if you develop symptoms. If you have been in contact with anyone that has tested positive in the last 10 days please notify you surgeon.

## 2022-03-02 ENCOUNTER — Encounter (HOSPITAL_COMMUNITY): Payer: Self-pay

## 2022-03-02 ENCOUNTER — Other Ambulatory Visit: Payer: Self-pay

## 2022-03-02 ENCOUNTER — Encounter (HOSPITAL_COMMUNITY)
Admission: RE | Admit: 2022-03-02 | Discharge: 2022-03-02 | Disposition: A | Discharge status: Home / Self Care | Payer: Medicare Other | Source: Ambulatory Visit | Attending: General Surgery | Admitting: General Surgery

## 2022-03-02 ENCOUNTER — Encounter (HOSPITAL_COMMUNITY): Payer: Self-pay | Admitting: Vascular Surgery

## 2022-03-02 VITALS — BP 140/90 | HR 82 | Temp 98.3°F | Resp 17 | Ht 64.0 in | Wt 237.7 lb

## 2022-03-02 DIAGNOSIS — Z01818 Encounter for other preprocedural examination: Secondary | ICD-10-CM | POA: Diagnosis not present

## 2022-03-02 DIAGNOSIS — K429 Umbilical hernia without obstruction or gangrene: Secondary | ICD-10-CM | POA: Diagnosis not present

## 2022-03-02 DIAGNOSIS — K219 Gastro-esophageal reflux disease without esophagitis: Secondary | ICD-10-CM | POA: Insufficient documentation

## 2022-03-02 DIAGNOSIS — K8010 Calculus of gallbladder with chronic cholecystitis without obstruction: Secondary | ICD-10-CM | POA: Diagnosis not present

## 2022-03-02 DIAGNOSIS — Z6841 Body Mass Index (BMI) 40.0 and over, adult: Secondary | ICD-10-CM | POA: Insufficient documentation

## 2022-03-02 DIAGNOSIS — Z8249 Family history of ischemic heart disease and other diseases of the circulatory system: Secondary | ICD-10-CM | POA: Diagnosis not present

## 2022-03-02 DIAGNOSIS — D509 Iron deficiency anemia, unspecified: Secondary | ICD-10-CM | POA: Insufficient documentation

## 2022-03-02 DIAGNOSIS — I10 Essential (primary) hypertension: Secondary | ICD-10-CM | POA: Diagnosis not present

## 2022-03-02 DIAGNOSIS — E039 Hypothyroidism, unspecified: Secondary | ICD-10-CM | POA: Diagnosis not present

## 2022-03-02 DIAGNOSIS — E785 Hyperlipidemia, unspecified: Secondary | ICD-10-CM | POA: Insufficient documentation

## 2022-03-02 HISTORY — DX: Family history of other specified conditions: Z84.89

## 2022-03-02 LAB — BASIC METABOLIC PANEL
Anion gap: 11 (ref 5–15)
BUN: 18 mg/dL (ref 8–23)
CO2: 28 mmol/L (ref 22–32)
Calcium: 9.6 mg/dL (ref 8.9–10.3)
Chloride: 100 mmol/L (ref 98–111)
Creatinine, Ser: 0.86 mg/dL (ref 0.44–1.00)
GFR, Estimated: >60 mL/min (ref >60)
Glucose, Bld: 109 mg/dL — ABNORMAL HIGH (ref 70–99)
Potassium: 3.5 mmol/L (ref 3.5–5.1)
Sodium: 139 mmol/L (ref 135–145)

## 2022-03-02 LAB — CBC
HCT: 38.5 % (ref 36.0–46.0)
Hemoglobin: 12.3 g/dL (ref 12.0–15.0)
MCH: 28 pg (ref 26.0–34.0)
MCHC: 31.9 g/dL (ref 30.0–36.0)
MCV: 87.7 fL (ref 80.0–100.0)
Platelets: 334 10*3/uL (ref 150–400)
RBC: 4.39 MIL/uL (ref 3.87–5.11)
RDW: 22.4 % — ABNORMAL HIGH (ref 11.5–15.5)
WBC: 8.4 10*3/uL (ref 4.0–10.5)
nRBC: 0 % (ref 0.0–0.2)

## 2022-03-02 NOTE — Progress Notes (Signed)
Anesthesia Chart Review:  Case: [id number] Date/Time: 03/09/22 1245   Procedures:      LAPAROSCOPIC CHOLECYSTECTOMY, POSSIBLE CHOLANGIOGRAM     OPEN UMBILICAL HERNIA REPAIR   Anesthesia type: General   Pre-op diagnosis: CHRONIC CALCULOUS CHOLECYSTITIS, UMBILICAL HERNIA   Location: [facility] OR ROOM 02 / [place] OR   Surgeons: Stark Klein, MD       DISCUSSION: Patient is a 76 year old female scheduled for the above procedure.   History includes never smoker, HLD, HTN, hypothyroidism, breast cancer (right breast lumpectomy 03/09/15, s/p radiation, Tamoxifen x 5 years; bilateral breast reduction 09/14/20), GERD, iron deficiency anemia, anxiety, gastric bypass (08/2003). BMI is consistent with morbid obesity.   I evaluated Jennifer Bartlett during her PAT visit due EKG findings of NSR, first degree AV block, right BBB, LAFB, bifascicular block, LVH. Her QRS had widened from 138 ms to 256 ms and septal T wave abnormality was more apparent (although secondary T wave changes related to her RBBB suspected) when compared to her 09/10/20 tracing, but otherwise likely stable--she also had RBBB and first degree AV block dating back to 03/08/15. She denied known CAD, but did report that both her parents died from MI. Both were smokers. Her father died at age 71 and was also an alcoholic. Her mother died at age 33 and also had HTN.  She denied chest pain, SOB at rest, palpitations, syncope. She did say that her hernia seemed to enlarge rather rapidly over the past several months (~ 3 months) and feels like it is pressing on her diaphrgam which makes her feel short of breath with activities, like she has to take a deep breath. She feels SOB when lying flat which is not new and has been using a "help" reclining chair for about a year now. She did notice new pretibial and ankle edema over the last two months which she correlated with starting levothyroxine. She said swelling has persisted, nothing really makes it better. She is on HCTZ  for HTN. She is still able to do activities such as wash dishes, cook, vacuum. Sometimes she will have to stop and take a deep breath, but no chest pain. She has an 1800 ft driveway that she used to walk on for exercise, but has not been able to do this due to feeling SOB related to what seems like pressure on her diaphragm. She had been pretty fatigued/exhausted when she was anemic, but since undergoing iron infusions she says her energy level has returned.  (HGB low of 9.2 on 12/21/21, up to 12.3 on 03/02/22.)  DOE may be related to her hernia and pressure to her diaphragm, but given new ankle edema, EKG findings and family history would advise primary care preoperative evaluation. Her anemia is improved. She is not diabetic. Her lungs were clear. Obesity may be contributing to edema. Fortunately, she does not have chest pain and does not have symptoms related to her bifascicular block, although we discussed symptoms that could indicate progression of heart block that may warrant cardiology evaluation at some point.  Discussed with anesthesiologist Oren Bracket, MD. I will also reach out to Jennifer Bartlett's PCP Tower, Wynelle Fanny, MD to see if patient can be evaluated prior to her scheduled surgery date.    VS: BP (!) 140/90 Comment: manual  Pulse 82   Temp 36.8 C (Oral)   Resp 17   Ht '5\' 4"'$  (1.626 m)   Wt 107.8 kg   LMP 06/08/1989   SpO2 98%  BMI 40.80 kg/m  Exam shows a very pleasant white female in NAD. No conversational dyspnea. Heart RRR, no murmur noted. No carotid bruit noted. Lungs clear. She does have 1+ bilateral ankle edema, no erythema noted. Says she has full dentures.    PROVIDERS: Tower, Wynelle Fanny, MD is PCP  Magrinat, Sarajane Jews, MD is HEM-ONC (retired). Seen by Dede Query, PA-C on 01/10/22 for IDA.  Last Feraheme infusion 01/25/22.    LABS: Labs reviewed: Acceptable for surgery. LFTs normal 01/10/22.  (all labs ordered are listed, but only abnormal results are displayed)  Labs  Reviewed  BASIC METABOLIC PANEL - Abnormal; Notable for the following components:      Result Value   Glucose, Bld 109 (*)    All other components within normal limits  CBC - Abnormal; Notable for the following components:   RDW 22.4 (*)    All other components within normal limits     IMAGES: [place] Abd 12/08/21: IMPRESSION: - Cholelithiasis without signs of acute cholecystitis but with large gallstones and sludge in the gallbladder neck. - Question liver disease with nodular surface contour and signs of hepatic steatosis. Liver parenchymal evaluation is very limited due to body habitus and poor acoustic window. Cross-sectional imaging may be of benefit in this instance with CT or MRI. Would also correlate with any clinical or laboratory evidence of liver disease.    EKG:  EKG 03/02/22: Sinus rhythm with 1st degree A-V block with Premature atrial complexes Right bundle branch block Left anterior fascicular block ** Bifascicular block ** Minimal voltage criteria for LVH, may be normal variant ( R in aVL ) Abnormal ECG - See DISCUSSION.   CV: N/A  Past Medical History:  Diagnosis Date   Anemia    Anxiety    Arthritis    hands, knees   Breast cancer (HCC)    Breast cancer of upper-inner quadrant of right female breast ([place]) 02/26/2015   Depression    Family history of adverse reaction to anesthesia    mother has had PONV   GERD (gastroesophageal reflux disease)    Hot flashes    Hyperlipidemia    Hypertension    Hypoglycemia    Hypothyroidism    Obesity    Personal history of radiation therapy    Vitamin B 12 deficiency     Past Surgical History:  Procedure Laterality Date   ABDOMINAL HYSTERECTOMY     BREAST BIOPSY     BREAST LUMPECTOMY Right    2016   BREAST REDUCTION SURGERY Bilateral 09/14/2020   Procedure: MAMMARY REDUCTION  (BREAST);  Surgeon: Irene Limbo, MD;  Location: [place];  Service: Plastics;  Laterality: Bilateral;    GASTRIC BYPASS  08/2003   KNEE SURGERY     left   PARTIAL HYSTERECTOMY  1990's   bleeding   RADIOACTIVE SEED GUIDED PARTIAL MASTECTOMY WITH AXILLARY SENTINEL LYMPH NODE BIOPSY Right 03/09/2015   Procedure: RADIOACTIVE SEED GUIDED RIGHT PARTIAL MASTECTOMY WITH AXILLARY SENTINEL LYMPH NODE BIOPSY;  Surgeon: Stark Klein, MD;  Location: [place];  Service: General;  Laterality: Right;   REDUCTION MAMMAPLASTY Bilateral 08/2020   TUBAL LIGATION      MEDICATIONS:  acetaminophen (TYLENOL) 500 MG tablet   Biotin 10 MG TABS   CALCIUM PO   Cholecalciferol (D3 VITAMIN PO)   diclofenac Sodium (VOLTAREN) 1 % GEL   hydrochlorothiazide (HYDRODIURIL) 25 MG tablet   iron polysaccharides (NIFEREX) 150 MG capsule   ketoconazole (NIZORAL) 2 % cream  levothyroxine (SYNTHROID) 75 MCG tablet   loratadine (CLARITIN) 10 MG tablet   Multiple Vitamin (MULTIVITAMIN) capsule   naproxen sodium (ALEVE) 220 MG tablet   omeprazole (PRILOSEC) 20 MG capsule   OVER THE COUNTER MEDICATION   No current facility-administered medications for this encounter.    Myra Gianotti, PA-C Surgical Short Stay/Anesthesiology [facility] Phone [phone] [facility] Phone [phone] 03/02/2022 6:33 PM

## 2022-03-02 NOTE — Progress Notes (Signed)
PCP - Dr. Loura Pardon Cardiologist - denies  PPM/ICD - denies   Chest x-ray - 08/07/2003 EKG - 03/02/22 Stress Test - denies ECHO - denies Cardiac Cath - denies  Sleep Study - denies   DM- denies  ASA/Blood Thinner Instructions: n/a   ERAS Protcol - yes PRE-SURGERY Ensure given at PAT  COVID TEST- n/a   Anesthesia review: yes, abnormal EKG  Patient denies shortness of breath, fever, cough and chest pain at PAT appointment   All instructions explained to the patient, with a verbal understanding of the material. Patient agrees to go over the instructions while at home for a better understanding. The opportunity to ask questions was provided.

## 2022-03-03 NOTE — Telephone Encounter (Signed)
Left message on voicemail for patient to call the office back. Need to schedule an appointment per Dr. Marliss Coots note.

## 2022-03-03 NOTE — Telephone Encounter (Signed)
I received this note from her surgical team  Hi, I am a physician assistant with the pre-anesthesia clinic at [facility]. Your office lines are having technical problems, so I thought I would try to see if I could contact you via [place]. Jennifer Bartlett is scheduled for cholecystectomy, open umbilical hernia repair by Dr. Barry Dienes on 03/09/22. Her EKG today showed bifascicular block (QRS is wider but otherwise stable since 2022). She has had new BLE ankle edema for the past 2-3 months and has had more DOE--although she feels that is related to her hernia pushing up against her diaphragm. No real SOB at rest, no chest pain, palpitations. Lungs clear. Feels her energy is back to baseline after iron infusion. I discussed with anesthesiologist Dr. Therisa Doyne and we were hoping she could be seen at your office before surgery to make sure she is optimized for surgery.  I'll finish my note later today.  Call me at [phone] or [phone] if that is easier.  Thanks, Myra Gianotti, PA-C  Would you please contact pt to come in -please put in any slot (a same day is ok) Get an ekg when she gets here as well please  Unsure if we can get her cleared before surgery but can try   Thanks

## 2022-03-03 NOTE — Telephone Encounter (Signed)
Patient scheduled appt for 03/06/22

## 2022-03-06 ENCOUNTER — Ambulatory Visit (INDEPENDENT_AMBULATORY_CARE_PROVIDER_SITE_OTHER): Payer: Medicare Other | Admitting: Family Medicine

## 2022-03-06 ENCOUNTER — Ambulatory Visit (INDEPENDENT_AMBULATORY_CARE_PROVIDER_SITE_OTHER)
Admission: RE | Admit: 2022-03-06 | Discharge: 2022-03-06 | Disposition: A | Discharge status: Home / Self Care | Payer: Medicare Other | Source: Ambulatory Visit | Attending: Family Medicine | Admitting: Family Medicine

## 2022-03-06 ENCOUNTER — Encounter: Payer: Self-pay | Admitting: Family Medicine

## 2022-03-06 VITALS — BP 142/80 | HR 87 | Ht 64.0 in | Wt 235.4 lb

## 2022-03-06 DIAGNOSIS — R918 Other nonspecific abnormal finding of lung field: Secondary | ICD-10-CM | POA: Diagnosis not present

## 2022-03-06 DIAGNOSIS — R0609 Other forms of dyspnea: Secondary | ICD-10-CM

## 2022-03-06 DIAGNOSIS — I10 Essential (primary) hypertension: Secondary | ICD-10-CM

## 2022-03-06 DIAGNOSIS — R7989 Other specified abnormal findings of blood chemistry: Secondary | ICD-10-CM | POA: Diagnosis not present

## 2022-03-06 DIAGNOSIS — R9431 Abnormal electrocardiogram [ECG] [EKG]: Secondary | ICD-10-CM

## 2022-03-06 DIAGNOSIS — M25472 Effusion, left ankle: Secondary | ICD-10-CM

## 2022-03-06 DIAGNOSIS — M25471 Effusion, right ankle: Secondary | ICD-10-CM | POA: Diagnosis not present

## 2022-03-06 DIAGNOSIS — K8020 Calculus of gallbladder without cholecystitis without obstruction: Secondary | ICD-10-CM | POA: Diagnosis not present

## 2022-03-06 DIAGNOSIS — R0602 Shortness of breath: Secondary | ICD-10-CM | POA: Diagnosis not present

## 2022-03-06 MED ORDER — DOXYCYCLINE HYCLATE 100 MG PO TABS: 100.0000 mg | ORAL_TABLET | Freq: Two times a day (BID) | ORAL | 0 refills | Status: DC

## 2022-03-06 NOTE — Assessment & Plan Note (Signed)
This is new  No pnd/orthopnea but is sob with exertion (she thinks from her hernia)   Has been standing and working more every since her transfusion  Nl exam cxr and BNP and cmet pending

## 2022-03-06 NOTE — Progress Notes (Addendum)
Subjective:    Patient ID: Jennifer Bartlett, female    DOB: Jul 02, 1946, 76 y.o.   MRN: [id number]  HPI Pt presents with change on EKG (getting ready for gb surgery)  Wt Readings from Last 3 Encounters:  03/06/22 235 lb 6.4 oz (106.8 kg)  03/02/22 237 lb 11.2 oz (107.8 kg)  01/18/22 229 lb (103.9 kg)   40.41 kg/m   Scheduled for ccy and umbilical hernia repari (open) on 8/10    Newest EKG for upcoming surgery notes QRS wider but otherwise stable since 2022  NSR 83 First degree av block  PACs L anterior fascicular block  (bifascicular block) Min voltage criteria for LVH - may be nl variant R in aVL  Today's ekg is similar   She noted ankle edema for 2-3 months and more DOE than usual (she thinks from her hernia)  No sob at rest  No cp or palpiations Lungs were clear Energy level up after her transfusion  3 months Ankles are more swollen  Shin to ankle She elevates when she sits  Drinks only water   Stopped eating bread  Eating lot of lettuce and [place] lunch meat  Does not add salt  Is careful  Avoiding fat for gallbladder  Thinks any sob is from stomach   No PND  No orthopnea-can lie flat   Cxr today  DG Chest 2 View  Result Date: 03/06/2022 CLINICAL DATA:  Provided history: Shortness of breath, swollen ankles, abdominal hernia. EXAM: CHEST - 2 VIEW COMPARISON:  Mammogram 07/26/2021. FINDINGS: Heart size within normal limits. Aortic atherosclerosis. Opacity within the right middle lobe suspicious for pneumonia. No appreciable airspace consolidation within the left lung. No evidence of pleural effusion or pneumothorax. No acute bony abnormality identified. Redemonstrated nodular calcified focus within the right breast soft tissues. This finding was present on the prior mammogram of 07/26/2021. Surgical clips along the right chest wall. Surgical clips within the upper abdomen. IMPRESSION: Right middle lobe opacity suspicious for pneumonia. Followup PA and  lateral chest radiographs are recommended in 3-4 weeks following trial of antibiotic therapy to ensure resolution and exclude underlying malignancy. Aortic Atherosclerosis (ICD10-I70.0). Electronically Signed   By: Kellie Simmering D.O.   On: 03/06/2022 13:08   Unexpected RML opacity    Quit taking aleve 10 days ago     Lab Results  Component Value Date   TSH 4.02 12/01/2021      HTN  Bp was quite high in surg office on first check  BP Readings from Last 3 Encounters:  03/06/22 (!) 142/80  03/02/22 (!) 140/90  01/25/22 (!) 178/70   Pulse Readings from Last 3 Encounters:  03/06/22 87  03/02/22 82  01/25/22 74   Hctz 25 mg daily   Lab Results  Component Value Date   CREATININE 0.86 03/02/2022   BUN 18 03/02/2022   NA 139 03/02/2022   K 3.5 03/02/2022   CL 100 03/02/2022   CO2 28 03/02/2022  GFR pver 60    Lab Results  Component Value Date   WBC 8.4 03/02/2022   HGB 12.3 03/02/2022   HCT 38.5 03/02/2022   MCV 87.7 03/02/2022   PLT 334 03/02/2022   Good cbc  Feels SO much better!  Is very active  Is standing more    Lab Results  Component Value Date   CHOL 189 12/01/2021   HDL 44.50 12/01/2021   LDLCALC 122 (H) 12/01/2021   LDLDIRECT 139.2 08/25/2013   TRIG 114.0 12/01/2021  CHOLHDL 4 12/01/2021   Intol of all statin doses tried   The 10-year ASCVD risk score (Arnett DK, et al., 2019) is: 22.9%   Values used to calculate the score:     Age: 62 years     Sex: Female     Is Non-Hispanic African American: No     Diabetic: No     Tobacco smoker: No     Systolic Blood Pressure: 026 mmHg     Is BP treated: Yes     HDL Cholesterol: 44.5 mg/dL     Total Cholesterol: 189 mg/dL  Takes tylenol for pain   Patient Active Problem List   Diagnosis Date Noted   Abnormal EKG 03/06/2022   Ankle edema, bilateral 03/06/2022   Iron deficiency anemia 01/11/2022   Gallstones 12/08/2021   Anemia 12/05/2021   Abdominal pain, right upper quadrant 12/05/2021    Fatigue 37/85/8850   Umbilical hernia 27/74/1287   Pedal edema 12/05/2021   DOE (dyspnea on exertion) 12/05/2021   Medicare annual wellness visit, subsequent 06/01/2021   Current use of proton pump inhibitor 06/01/2021   Hypothyroid 01/07/2020   History of breast cancer 12/05/2019   Prediabetes 07/04/2016   Routine general medical examination at a health care facility 07/04/2016   Encounter for hepatitis C screening test for low risk patient 06/29/2016   Estrogen deficiency 01/11/2016   Malignant neoplasm of upper-inner quadrant of right breast in female, estrogen receptor positive ([facility]) 02/26/2015   Colon cancer screening 09/06/2012   Vitamin D deficiency 12/28/2009   STRESS REACTION, ACUTE, WITH EMOTIONAL DISTURBANCE 12/07/2009   HYPOGLYCEMIA NOS 05/28/2007   Vitamin B12 deficiency 03/07/2007   HYPERCHOLESTEROLEMIA 03/07/2007   BMI 40.0-44.9, adult ([place]) 03/07/2007   Essential hypertension 03/07/2007   GERD 03/07/2007   Past Medical History:  Diagnosis Date   Anemia    Anxiety    Arthritis    hands, knees   Breast cancer ([facility])    Breast cancer of upper-inner quadrant of right female breast ([place]) 02/26/2015   Depression    Family history of adverse reaction to anesthesia    mother has had PONV   GERD (gastroesophageal reflux disease)    Hot flashes    Hyperlipidemia    Hypertension    Hypoglycemia    Hypothyroidism    Obesity    Personal history of radiation therapy    Vitamin B 12 deficiency    Past Surgical History:  Procedure Laterality Date   ABDOMINAL HYSTERECTOMY     BREAST BIOPSY     BREAST LUMPECTOMY Right    2016   BREAST REDUCTION SURGERY Bilateral 09/14/2020   Procedure: MAMMARY REDUCTION  (BREAST);  Surgeon: Irene Limbo, MD;  Location: [facility];  Service: Plastics;  Laterality: Bilateral;   GASTRIC BYPASS  08/2003   KNEE SURGERY     left   PARTIAL HYSTERECTOMY  1990's   bleeding   RADIOACTIVE SEED GUIDED PARTIAL MASTECTOMY  WITH AXILLARY SENTINEL LYMPH NODE BIOPSY Right 03/09/2015   Procedure: RADIOACTIVE SEED GUIDED RIGHT PARTIAL MASTECTOMY WITH AXILLARY SENTINEL LYMPH NODE BIOPSY;  Surgeon: Stark Klein, MD;  Location: [facility];  Service: General;  Laterality: Right;   REDUCTION MAMMAPLASTY Bilateral 08/2020   TUBAL LIGATION     Social History   Tobacco Use   Smoking status: Never   Smokeless tobacco: Never  Vaping Use   Vaping Use: Never used  Substance Use Topics   Alcohol use: No   Drug use: No  Family History  Problem Relation Age of Onset   Heart disease Mother        MI   Hypertension Mother    Alcohol abuse Father    Heart disease Father        smoker   Hypertension Father    Breast cancer Paternal Grandmother    Diabetes Maternal Aunt    Allergies  Allergen Reactions   Crestor [Rosuvastatin Calcium] Other (See Comments)    Myalgia    Lipitor [Atorvastatin Calcium] Other (See Comments)    Aches and pains   Penicillins Shortness Of Breath and Swelling   Zocor [Simvastatin - High Dose] Other (See Comments)    Muscle/joint aches   Current Outpatient Medications on File Prior to Visit  Medication Sig Dispense Refill   acetaminophen (TYLENOL) 500 MG tablet Take 1,000 mg by mouth every 6 (six) hours as needed for moderate pain.     Biotin 10 MG TABS Take 10 mg by mouth in the morning and at bedtime.     CALCIUM PO Take 1 tablet by mouth daily.     Cholecalciferol (D3 VITAMIN PO) Take 4,000 Units by mouth daily.     diclofenac Sodium (VOLTAREN) 1 % GEL Apply 2 g topically 4 (four) times daily as needed (pain).     hydrochlorothiazide (HYDRODIURIL) 25 MG tablet Take 1 tablet (25 mg total) by mouth daily. 90 tablet 3   iron polysaccharides (NIFEREX) 150 MG capsule Take 1 capsule (150 mg total) by mouth daily. 30 capsule 3   ketoconazole (NIZORAL) 2 % cream APPLY TOPICALLY DAILY (Patient taking differently: 1 Application daily as needed (irritation under breast).) 15 g 0    levothyroxine (SYNTHROID) 75 MCG tablet Take 1 tablet by mouth once daily 90 tablet 2   loratadine (CLARITIN) 10 MG tablet Take 10 mg by mouth daily as needed for allergies.     Multiple Vitamin (MULTIVITAMIN) capsule Take 1 capsule by mouth daily.     naproxen sodium (ALEVE) 220 MG tablet Take 220 mg by mouth daily as needed (pain).     omeprazole (PRILOSEC) 20 MG capsule Take 1 capsule (20 mg total) by mouth daily. 90 capsule 3   OVER THE COUNTER MEDICATION Take 1 capsule by mouth daily. InstaFlex     No current facility-administered medications on file prior to visit.    Review of Systems  Constitutional:  Negative for activity change, appetite change, fatigue, fever and unexpected weight change.       Fatigue is much better  HENT:  Negative for congestion, ear pain, rhinorrhea, sinus pressure and sore throat.   Eyes:  Negative for pain, redness and visual disturbance.  Respiratory:  Negative for cough, choking, chest tightness, shortness of breath, wheezing and stridor.   Cardiovascular:  Positive for leg swelling. Negative for chest pain and palpitations.  Gastrointestinal:  Positive for abdominal pain. Negative for blood in stool, constipation and diarrhea.       From hernia and gallbladder  Endocrine: Negative for polydipsia and polyuria.  Genitourinary:  Negative for dysuria, frequency and urgency.  Musculoskeletal:  Negative for arthralgias, back pain and myalgias.  Skin:  Negative for pallor and rash.  Allergic/Immunologic: Negative for environmental allergies.  Neurological:  Negative for dizziness, syncope and headaches.  Hematological:  Negative for adenopathy. Does not bruise/bleed easily.  Psychiatric/Behavioral:  Negative for decreased concentration and dysphoric mood. The patient is not nervous/anxious.        Objective:   Physical Exam Constitutional:  General: She is not in acute distress.    Appearance: Normal appearance. She is well-developed. She is  obese. She is not ill-appearing or diaphoretic.  HENT:     Head: Normocephalic and atraumatic.     Mouth/Throat:     Mouth: Mucous membranes are moist.     Pharynx: No posterior oropharyngeal erythema.  Eyes:     General: No scleral icterus.    Conjunctiva/sclera: Conjunctivae normal.     Pupils: Pupils are equal, round, and reactive to light.  Neck:     Thyroid: No thyromegaly.     Vascular: No carotid bruit or JVD.  Cardiovascular:     Rate and Rhythm: Normal rate and regular rhythm.     Heart sounds: Normal heart sounds.     No gallop.  Pulmonary:     Effort: Pulmonary effort is normal. No respiratory distress.     Breath sounds: Normal breath sounds. No wheezing or rales.  Abdominal:     General: There is no distension or abdominal bruit.     Palpations: Abdomen is soft.  Musculoskeletal:     Cervical back: Normal range of motion and neck supple.     Right lower leg: No edema.     Left lower leg: No edema.     Comments: One plus pedal edema  No tenderness No palp cords  Neg homan's sign  Lymphadenopathy:     Cervical: No cervical adenopathy.  Skin:    General: Skin is warm and dry.     Coloration: Skin is not pale.     Findings: No rash.  Neurological:     Mental Status: She is alert.     Cranial Nerves: No cranial nerve deficit.     Motor: No weakness.     Coordination: Coordination normal.     Deep Tendon Reflexes: Reflexes are normal and symmetric. Reflexes normal.  Psychiatric:        Mood and Affect: Mood normal.           Assessment & Plan:   Problem List Items Addressed This Visit       Cardiovascular and Mediastinum   Essential hypertension    Fair control with some white coat effect bp in fair control at this time  BP Readings from Last 1 Encounters:  03/06/22 (!) 142/80   No changes needed Most recent labs reviewed  Disc lifstyle change with low sodium diet and exercise  Plan ton continue hctz 25 mg daily for now  Enc lower sodium  (less processed meats) Lab today      Relevant Orders   EKG 12-Lead (Completed)   Brain natriuretic peptide   Comprehensive metabolic panel     Digestive   Gallstones    For upcoming ccy  Also umbilical hernia repair        Other   Abnormal EKG    EKG is stable today with RBBB and bifascicular block   No change from prior         Relevant Orders   EKG 12-Lead (Completed)   Ankle edema, bilateral - Primary    This is new  No pnd/orthopnea but is sob with exertion (she thinks from her hernia)   Has been standing and working more every since her transfusion  Nl exam cxr and BNP and cmet pending       Relevant Orders   Brain natriuretic peptide   Comprehensive metabolic panel   DOE (dyspnea on exertion)    Pt thinks  this is due to pressure from hernia  Some ankle swelling but no wt gain  Had recent transfusion and is standing and working more  Obesity noted        Relevant Orders   DG Chest 2 View (Completed)

## 2022-03-06 NOTE — Addendum Note (Signed)
Addended by: Loura Pardon A on: 03/06/2022 03:09 PM   Modules accepted: Orders

## 2022-03-06 NOTE — Assessment & Plan Note (Signed)
EKG is stable today with RBBB and bifascicular block   No change from prior

## 2022-03-06 NOTE — Assessment & Plan Note (Signed)
Fair control with some white coat effect bp in fair control at this time  BP Readings from Last 1 Encounters:  03/06/22 (!) 142/80   No changes needed Most recent labs reviewed  Disc lifstyle change with low sodium diet and exercise  Plan ton continue hctz 25 mg daily for now  Enc lower sodium (less processed meats) Lab today

## 2022-03-06 NOTE — Patient Instructions (Addendum)
Stay off aleve -it causes swelling and stomach trouble    Labs chest xray today   Call us if swelling or shortness of breath worsen  Also if any chest pain go to ER

## 2022-03-06 NOTE — Assessment & Plan Note (Signed)
For upcoming ccy  Also umbilical hernia repair

## 2022-03-06 NOTE — Assessment & Plan Note (Signed)
Pt thinks this is due to pressure from hernia  Some ankle swelling but no wt gain  Had recent transfusion and is standing and working more  Obesity noted

## 2022-03-06 NOTE — Assessment & Plan Note (Signed)
RML opacity Has had sob  No cough or fever  Fairly nl exam  Will place hold on her surgery  tx with doxycycline (is pcn allergic) with f/u in a week  Will proceed from there  Consider CT if not improved

## 2022-03-07 ENCOUNTER — Telehealth: Payer: Self-pay | Admitting: Family Medicine

## 2022-03-07 LAB — COMPREHENSIVE METABOLIC PANEL
AG Ratio: 1.5 (calc) (ref 1.0–2.5)
ALT: 15 U/L (ref 6–29)
AST: 17 U/L (ref 10–35)
Albumin: 4.3 g/dL (ref 3.6–5.1)
Alkaline phosphatase (APISO): 83 U/L (ref 37–153)
BUN: 22 mg/dL (ref 7–25)
CO2: 28 mmol/L (ref 20–32)
Calcium: 9.7 mg/dL (ref 8.6–10.4)
Chloride: 98 mmol/L (ref 98–110)
Creat: 0.95 mg/dL (ref 0.60–1.00)
Globulin: 2.8 g/dL (calc) (ref 1.9–3.7)
Glucose, Bld: 93 mg/dL (ref 65–99)
Potassium: 4.6 mmol/L (ref 3.5–5.3)
Sodium: 139 mmol/L (ref 135–146)
Total Bilirubin: 0.3 mg/dL (ref 0.2–1.2)
Total Protein: 7.1 g/dL (ref 6.1–8.1)

## 2022-03-07 LAB — BRAIN NATRIURETIC PEPTIDE: Brain Natriuretic Peptide: 61 pg/mL (ref <100)

## 2022-03-07 NOTE — Telephone Encounter (Signed)
Pt call back to confirm if her surgery was  cancel with Dr Barry Dienes 's office. I advised pt I had spoke with  her nurse on yesterday and gave her the information regarding her surgery , pt also wanted to know when her follow up appt was reminded pt that her pt was for next week 03/15/22 at 8am. Pt said that she has started her antibiotic on yesterday.

## 2022-03-07 NOTE — Telephone Encounter (Signed)
Pt called in requesting a call back regarding canceling surgery appointment . Please advise #[phone]

## 2022-03-08 ENCOUNTER — Telehealth: Payer: Self-pay

## 2022-03-08 NOTE — Telephone Encounter (Signed)
-----   Message from Abner Greenspan, MD sent at 03/07/2022  4:56 PM EDT ----- Your labs look good  The BNP test for heart failure is negative (in normal range) This is very reassuring

## 2022-03-08 NOTE — Telephone Encounter (Signed)
I left a message for the patient to return my call.

## 2022-03-08 NOTE — Telephone Encounter (Signed)
Called and spoke with patient regarding lab ,pt is aware of her results.

## 2022-03-08 NOTE — Telephone Encounter (Signed)
Patient called in returning call she missed.  

## 2022-03-09 ENCOUNTER — Ambulatory Visit (HOSPITAL_COMMUNITY): Admission: RE | Admit: 2022-03-09 | Payer: Medicare Other | Source: Ambulatory Visit | Admitting: General Surgery

## 2022-03-09 ENCOUNTER — Encounter (HOSPITAL_COMMUNITY): Admission: RE | Payer: Self-pay | Source: Ambulatory Visit

## 2022-03-09 SURGERY — LAPAROSCOPIC CHOLECYSTECTOMY
Anesthesia: General

## 2022-03-15 ENCOUNTER — Encounter: Payer: Self-pay | Admitting: Family Medicine

## 2022-03-15 ENCOUNTER — Ambulatory Visit (INDEPENDENT_AMBULATORY_CARE_PROVIDER_SITE_OTHER): Payer: Medicare Other | Admitting: Family Medicine

## 2022-03-15 VITALS — BP 141/85 | HR 97 | Ht 64.0 in | Wt 232.8 lb

## 2022-03-15 DIAGNOSIS — R918 Other nonspecific abnormal finding of lung field: Secondary | ICD-10-CM

## 2022-03-15 DIAGNOSIS — K8020 Calculus of gallbladder without cholecystitis without obstruction: Secondary | ICD-10-CM | POA: Diagnosis not present

## 2022-03-15 DIAGNOSIS — I10 Essential (primary) hypertension: Secondary | ICD-10-CM

## 2022-03-15 MED ORDER — CHLORTHALIDONE 25 MG PO TABS: 25.0000 mg | ORAL_TABLET | Freq: Every day | ORAL | 3 refills | Status: DC

## 2022-03-15 NOTE — Patient Instructions (Addendum)
Let's try chlorthalidone instead of hctz It is a similar medicine but may work a little better   Stop hctz and start chlorthalidone 25 mg instead   If any side effects or problems let us know   Follow up here in 2 weeks for visit/chest xray and labs   If your shortness of breath worsens again please let us know

## 2022-03-15 NOTE — Assessment & Plan Note (Signed)
RML opacity susp for pna Now finishing doxycycline course and feels better  No longer sob  Some fatigue gradually imp Reassuring exam   Plan f/u 2 wk for visit and cxr and then clear for her ccy surgery if clear

## 2022-03-15 NOTE — Progress Notes (Signed)
Subjective:    Patient ID: Jennifer Bartlett, female    DOB: [birth year], 76 y.o.   MRN: [id number]  HPI Pt presents for f/u of pneumonia and surgery clearance   Wt Readings from Last 3 Encounters:  03/15/22 232 lb 12.8 oz (105.6 kg)  03/06/22 235 lb 6.4 oz (106.8 kg)  03/02/22 237 lb 11.2 oz (107.8 kg)   39.96 kg/m  Feeling better overall  Has good and bad days with gallstones, no severe abd pain  Is watching what she eats   Last visit we reviewed her baseline EKG in prep for ccy and hernia repair  Noted first deg av block, PACs , bifascicular block-not new   We discussed more DOE then usual along with occ ankle edema  BNP and chem labs were normal  Cxr showed likely pna DG Chest 2 View  Result Date: 03/06/2022 CLINICAL DATA:  Provided history: Shortness of breath, swollen ankles, abdominal hernia. EXAM: CHEST - 2 VIEW COMPARISON:  Mammogram 07/26/2021. FINDINGS: Heart size within normal limits. Aortic atherosclerosis. Opacity within the right middle lobe suspicious for pneumonia. No appreciable airspace consolidation within the left lung. No evidence of pleural effusion or pneumothorax. No acute bony abnormality identified. Redemonstrated nodular calcified focus within the right breast soft tissues. This finding was present on the prior mammogram of 07/26/2021. Surgical clips along the right chest wall. Surgical clips within the upper abdomen. IMPRESSION: Right middle lobe opacity suspicious for pneumonia. Followup PA and lateral chest radiographs are recommended in 3-4 weeks following trial of antibiotic therapy to ensure resolution and exclude underlying malignancy. Aortic Atherosclerosis (ICD10-I70.0). Electronically Signed   By: Kellie Simmering D.O.   On: 03/06/2022 13:08    Results for orders placed or performed in visit on 03/06/22  Brain natriuretic peptide  Result Value Ref Range   Brain Natriuretic Peptide 61 <100 pg/mL  Comprehensive metabolic panel  Result Value Ref Range    Glucose, Bld 93 65 - 99 mg/dL   BUN 22 7 - 25 mg/dL   Creat 0.95 0.60 - 1.00 mg/dL   BUN/Creatinine Ratio SEE NOTE: 6 - 22 (calc)   Sodium 139 135 - 146 mmol/L   Potassium 4.6 3.5 - 5.3 mmol/L   Chloride 98 98 - 110 mmol/L   CO2 28 20 - 32 mmol/L   Calcium 9.7 8.6 - 10.4 mg/dL   Total Protein 7.1 6.1 - 8.1 g/dL   Albumin 4.3 3.6 - 5.1 g/dL   Globulin 2.8 1.9 - 3.7 g/dL (calc)   AG Ratio 1.5 1.0 - 2.5 (calc)   Total Bilirubin 0.3 0.2 - 1.2 mg/dL   Alkaline phosphatase (APISO) 83 37 - 153 U/L   AST 17 10 - 35 U/L   ALT 15 6 - 29 U/L    HTN BP Readings from Last 3 Encounters:  03/15/22 (!) 146/80  03/06/22 (!) 142/80  03/02/22 (!) 140/90   Takes hctz 25 mg daily   She feels better overall  More energy  Finishing the doxycycline - causes some constipation but otherwise tolerates it will  Taking dulcolax Breathing is better   Patient Active Problem List   Diagnosis Date Noted   Abnormal EKG 03/06/2022   Ankle edema, bilateral 03/06/2022   Opacity of lung on imaging study 03/06/2022   Iron deficiency anemia 01/11/2022   Gallstones 12/08/2021   Anemia 12/05/2021   Abdominal pain, right upper quadrant 12/05/2021   Fatigue 47/03/6282   Umbilical hernia 66/29/4765   Pedal edema 12/05/2021  DOE (dyspnea on exertion) 12/05/2021   Medicare annual wellness visit, subsequent 06/01/2021   Current use of proton pump inhibitor 06/01/2021   Hypothyroid 01/07/2020   History of breast cancer 12/05/2019   Prediabetes 07/04/2016   Routine general medical examination at a health care facility 07/04/2016   Encounter for hepatitis C screening test for low risk patient 06/29/2016   Estrogen deficiency 01/11/2016   Malignant neoplasm of upper-inner quadrant of right breast in female, estrogen receptor positive ([place]) 02/26/2015   Colon cancer screening 09/06/2012   Vitamin D deficiency 12/28/2009   STRESS REACTION, ACUTE, WITH EMOTIONAL DISTURBANCE 12/07/2009   HYPOGLYCEMIA NOS  05/28/2007   Vitamin B12 deficiency 03/07/2007   HYPERCHOLESTEROLEMIA 03/07/2007   BMI 40.0-44.9, adult ([place]) 03/07/2007   Essential hypertension 03/07/2007   GERD 03/07/2007   Past Medical History:  Diagnosis Date   Anemia    Anxiety    Arthritis    hands, knees   Breast cancer ([place])    Breast cancer of upper-inner quadrant of right female breast ([place]) 02/26/2015   Depression    Family history of adverse reaction to anesthesia    mother has had PONV   GERD (gastroesophageal reflux disease)    Hot flashes    Hyperlipidemia    Hypertension    Hypoglycemia    Hypothyroidism    Obesity    Personal history of radiation therapy    Vitamin B 12 deficiency    Past Surgical History:  Procedure Laterality Date   ABDOMINAL HYSTERECTOMY     BREAST BIOPSY     BREAST LUMPECTOMY Right    2016   BREAST REDUCTION SURGERY Bilateral 09/14/2020   Procedure: MAMMARY REDUCTION  (BREAST);  Surgeon: Irene Limbo, MD;  Location: [facility];  Service: Plastics;  Laterality: Bilateral;   GASTRIC BYPASS  08/2003   KNEE SURGERY     left   PARTIAL HYSTERECTOMY  1990's   bleeding   RADIOACTIVE SEED GUIDED PARTIAL MASTECTOMY WITH AXILLARY SENTINEL LYMPH NODE BIOPSY Right 03/09/2015   Procedure: RADIOACTIVE SEED GUIDED RIGHT PARTIAL MASTECTOMY WITH AXILLARY SENTINEL LYMPH NODE BIOPSY;  Surgeon: Stark Klein, MD;  Location: [facility];  Service: General;  Laterality: Right;   REDUCTION MAMMAPLASTY Bilateral 08/2020   TUBAL LIGATION     Social History   Tobacco Use   Smoking status: Never   Smokeless tobacco: Never  Vaping Use   Vaping Use: Never used  Substance Use Topics   Alcohol use: No   Drug use: No   Family History  Problem Relation Age of Onset   Heart disease Mother        MI   Hypertension Mother    Alcohol abuse Father    Heart disease Father        smoker   Hypertension Father    Breast cancer Paternal Grandmother    Diabetes Maternal  Aunt    Allergies  Allergen Reactions   Crestor [Rosuvastatin Calcium] Other (See Comments)    Myalgia    Lipitor [Atorvastatin Calcium] Other (See Comments)    Aches and pains   Penicillins Shortness Of Breath and Swelling   Zocor [Simvastatin - High Dose] Other (See Comments)    Muscle/joint aches   Current Outpatient Medications on File Prior to Visit  Medication Sig Dispense Refill   acetaminophen (TYLENOL) 500 MG tablet Take 1,000 mg by mouth every 6 (six) hours as needed for moderate pain.     Biotin 10 MG TABS Take 10  mg by mouth in the morning and at bedtime.     CALCIUM PO Take 1 tablet by mouth daily.     Cholecalciferol (D3 VITAMIN PO) Take 4,000 Units by mouth daily.     diclofenac Sodium (VOLTAREN) 1 % GEL Apply 2 g topically 4 (four) times daily as needed (pain).     doxycycline (VIBRA-TABS) 100 MG tablet Take 1 tablet (100 mg total) by mouth 2 (two) times daily. 14 tablet 0   iron polysaccharides (NIFEREX) 150 MG capsule Take 1 capsule (150 mg total) by mouth daily. 30 capsule 3   ketoconazole (NIZORAL) 2 % cream APPLY TOPICALLY DAILY (Patient taking differently: 1 Application daily as needed (irritation under breast).) 15 g 0   levothyroxine (SYNTHROID) 75 MCG tablet Take 1 tablet by mouth once daily 90 tablet 2   loratadine (CLARITIN) 10 MG tablet Take 10 mg by mouth daily as needed for allergies.     Multiple Vitamin (MULTIVITAMIN) capsule Take 1 capsule by mouth daily.     naproxen sodium (ALEVE) 220 MG tablet Take 220 mg by mouth daily as needed (pain).     omeprazole (PRILOSEC) 20 MG capsule Take 1 capsule (20 mg total) by mouth daily. 90 capsule 3   OVER THE COUNTER MEDICATION Take 1 capsule by mouth daily. InstaFlex     No current facility-administered medications on file prior to visit.     Review of Systems  Constitutional:  Positive for fatigue. Negative for activity change, appetite change, fever and unexpected weight change.  HENT:  Negative for  congestion, ear pain, rhinorrhea, sinus pressure and sore throat.   Eyes:  Negative for pain, redness and visual disturbance.  Respiratory:  Negative for cough, shortness of breath and wheezing.   Cardiovascular:  Negative for chest pain and palpitations.  Gastrointestinal:  Negative for abdominal pain, blood in stool, constipation and diarrhea.       Abd discomfort off/on with gallstones Watching her diet   Endocrine: Negative for polydipsia and polyuria.  Genitourinary:  Negative for dysuria, frequency and urgency.  Musculoskeletal:  Negative for arthralgias, back pain and myalgias.  Skin:  Negative for pallor and rash.  Allergic/Immunologic: Negative for environmental allergies.  Neurological:  Negative for dizziness, syncope and headaches.  Hematological:  Negative for adenopathy. Does not bruise/bleed easily.  Psychiatric/Behavioral:  Negative for decreased concentration and dysphoric mood. The patient is not nervous/anxious.        Objective:   Physical Exam Constitutional:      General: She is not in acute distress.    Appearance: Normal appearance. She is well-developed. She is obese. She is not ill-appearing or diaphoretic.  HENT:     Head: Normocephalic and atraumatic.     Mouth/Throat:     Mouth: Mucous membranes are moist.  Eyes:     General: No scleral icterus.    Conjunctiva/sclera: Conjunctivae normal.     Pupils: Pupils are equal, round, and reactive to light.  Neck:     Thyroid: No thyromegaly.     Vascular: No carotid bruit or JVD.  Cardiovascular:     Rate and Rhythm: Regular rhythm. Tachycardia present.     Heart sounds: Normal heart sounds.     No gallop.  Pulmonary:     Effort: Pulmonary effort is normal. No respiratory distress.     Breath sounds: Normal breath sounds. No stridor. No wheezing, rhonchi or rales.  Chest:     Chest wall: No tenderness.  Abdominal:  General: There is no distension or abdominal bruit.     Palpations: Abdomen is soft.   Musculoskeletal:     Cervical back: Normal range of motion and neck supple.     Right lower leg: Edema present.     Left lower leg: Edema present.     Comments: Trace pedal edema bilat Not pitting   Lymphadenopathy:     Cervical: No cervical adenopathy.  Skin:    General: Skin is warm and dry.     Coloration: Skin is not pale.     Findings: No rash.  Neurological:     Mental Status: She is alert.     Coordination: Coordination normal.     Deep Tendon Reflexes: Reflexes are normal and symmetric. Reflexes normal.  Psychiatric:        Mood and Affect: Mood normal.           Assessment & Plan:   Problem List Items Addressed This Visit       Cardiovascular and Mediastinum   Essential hypertension    bp in fair control at this time  BP Readings from Last 1 Encounters:  03/15/22 (!) 141/85  Not quite at goal and pt also has worse edema in hot weather  Will try change from hctz to chlorthalidone 25 mg daily  Rev poss side eff Most recent labs reviewed  Disc lifstyle change with low sodium diet and exercise  F/u in 2 wk for visit and labs       Relevant Medications   chlorthalidone (HYGROTON) 25 MG tablet     Digestive   Gallstones    Currently resolving pna  Will re check cxr in 2 wk- if clear and feeling well can clear for surgery         Other   Opacity of lung on imaging study - Primary    RML opacity susp for pna Now finishing doxycycline course and feels better  No longer sob  Some fatigue gradually imp Reassuring exam   Plan f/u 2 wk for visit and cxr and then clear for her ccy surgery if clear

## 2022-03-15 NOTE — Assessment & Plan Note (Signed)
Currently resolving pna  Will re check cxr in 2 wk- if clear and feeling well can clear for surgery

## 2022-03-15 NOTE — Assessment & Plan Note (Signed)
bp in fair control at this time  BP Readings from Last 1 Encounters:  03/15/22 (!) 141/85   Not quite at goal and pt also has worse edema in hot weather  Will try change from hctz to chlorthalidone 25 mg daily  Rev poss side eff Most recent labs reviewed  Disc lifstyle change with low sodium diet and exercise  F/u in 2 wk for visit and labs

## 2022-03-29 ENCOUNTER — Ambulatory Visit (INDEPENDENT_AMBULATORY_CARE_PROVIDER_SITE_OTHER)
Admission: RE | Admit: 2022-03-29 | Discharge: 2022-03-29 | Disposition: A | Discharge status: Home / Self Care | Payer: Medicare Other | Source: Ambulatory Visit | Attending: Family Medicine | Admitting: Family Medicine

## 2022-03-29 ENCOUNTER — Encounter: Payer: Self-pay | Admitting: Family Medicine

## 2022-03-29 ENCOUNTER — Ambulatory Visit (INDEPENDENT_AMBULATORY_CARE_PROVIDER_SITE_OTHER): Payer: Medicare Other | Admitting: Family Medicine

## 2022-03-29 VITALS — BP 146/80 | HR 85 | Temp 97.6°F | Ht 64.0 in | Wt 230.0 lb

## 2022-03-29 DIAGNOSIS — M47814 Spondylosis without myelopathy or radiculopathy, thoracic region: Secondary | ICD-10-CM | POA: Diagnosis not present

## 2022-03-29 DIAGNOSIS — K8020 Calculus of gallbladder without cholecystitis without obstruction: Secondary | ICD-10-CM

## 2022-03-29 DIAGNOSIS — D508 Other iron deficiency anemias: Secondary | ICD-10-CM

## 2022-03-29 DIAGNOSIS — R918 Other nonspecific abnormal finding of lung field: Secondary | ICD-10-CM

## 2022-03-29 DIAGNOSIS — R600 Localized edema: Secondary | ICD-10-CM | POA: Diagnosis not present

## 2022-03-29 DIAGNOSIS — I10 Essential (primary) hypertension: Secondary | ICD-10-CM | POA: Diagnosis not present

## 2022-03-29 DIAGNOSIS — I700 Atherosclerosis of aorta: Secondary | ICD-10-CM | POA: Diagnosis not present

## 2022-03-29 LAB — BASIC METABOLIC PANEL
BUN: 21 mg/dL (ref 6–23)
CO2: 30 mEq/L (ref 19–32)
Calcium: 9.4 mg/dL (ref 8.4–10.5)
Chloride: 96 mEq/L (ref 96–112)
Creatinine, Ser: 0.92 mg/dL (ref 0.40–1.20)
GFR: 61.14 mL/min (ref >60.00)
Glucose, Bld: 106 mg/dL — ABNORMAL HIGH (ref 70–99)
Potassium: 3.5 mEq/L (ref 3.5–5.1)
Sodium: 136 mEq/L (ref 135–145)

## 2022-03-29 NOTE — Assessment & Plan Note (Signed)
Clinical improvement with tx of pna, cxr today  Working on bp control so we can clear for surgery   Pt is eating low fat and has less symptoms

## 2022-03-29 NOTE — Assessment & Plan Note (Signed)
Due to malabsorb from past bariatric surgery  Seeing hematology and getting iron infusions Feels much better  Last Hb 12.3

## 2022-03-29 NOTE — Patient Instructions (Signed)
Labs today  Continue the chlorthalidone  Once labs are back I may add another medicine (considering losartan)   Drink fluids Eat a lot fat diet   Chest xray today

## 2022-03-29 NOTE — Assessment & Plan Note (Signed)
RML Treated with doxycycline with much clinical improvement Nl exam  cxr today   In process of clearing for gb surgery

## 2022-03-29 NOTE — Progress Notes (Signed)
Subjective:    Patient ID: Jennifer Bartlett, female    DOB: [birth year], 76 y.o.   MRN: [id number]  HPI Pt presents for f/u of pneumonia and HTN  Wt Readings from Last 3 Encounters:  03/29/22 230 lb (104.3 kg)  03/15/22 232 lb 12.8 oz (105.6 kg)  03/06/22 235 lb 6.4 oz (106.8 kg)   39.48 kg/m    Last visit we changed hctz to chlorthalidone 25 mg daily  for HTN  bp is stable today  No cp or palpitations or headaches or edema  No side effects to medicines  BP Readings from Last 3 Encounters:  03/29/22 (!) 154/86  03/15/22 (!) 141/85  03/06/22 (!) 142/80     Pulse Readings from Last 3 Encounters:  03/29/22 85  03/15/22 97  03/06/22 87   Lab Results  Component Value Date   CREATININE 0.95 03/06/2022   BUN 22 03/06/2022   NA 139 03/06/2022   K 4.6 03/06/2022   CL 98 03/06/2022   CO2 28 03/06/2022   She feels better!  No longer has swollen ankles   Has not checked bp at home   Was tx with doxycycline for pna (opacity in RML of lung)   DG Chest 2 View  Result Date: 03/06/2022 CLINICAL DATA:  Provided history: Shortness of breath, swollen ankles, abdominal hernia. EXAM: CHEST - 2 VIEW COMPARISON:  Mammogram 07/26/2021. FINDINGS: Heart size within normal limits. Aortic atherosclerosis. Opacity within the right middle lobe suspicious for pneumonia. No appreciable airspace consolidation within the left lung. No evidence of pleural effusion or pneumothorax. No acute bony abnormality identified. Redemonstrated nodular calcified focus within the right breast soft tissues. This finding was present on the prior mammogram of 07/26/2021. Surgical clips along the right chest wall. Surgical clips within the upper abdomen. IMPRESSION: Right middle lobe opacity suspicious for pneumonia. Followup PA and lateral chest radiographs are recommended in 3-4 weeks following trial of antibiotic therapy to ensure resolution and exclude underlying malignancy. Aortic Atherosclerosis (ICD10-I70.0).  Electronically Signed   By: Kellie Simmering D.O.   On: 03/06/2022 13:08    Lab Results  Component Value Date   WBC 8.4 03/02/2022   HGB 12.3 03/02/2022   HCT 38.5 03/02/2022   MCV 87.7 03/02/2022   PLT 334 03/02/2022   Breathing is much better Energy level is better  No cp  No fever  No wheezing   Patient Active Problem List   Diagnosis Date Noted   Abnormal EKG 03/06/2022   Ankle edema, bilateral 03/06/2022   Opacity of lung on imaging study 03/06/2022   Iron deficiency anemia 01/11/2022   Gallstones 12/08/2021   Anemia 12/05/2021   Abdominal pain, right upper quadrant 12/05/2021   Fatigue 86/57/8469   Umbilical hernia 62/95/2841   Pedal edema 12/05/2021   DOE (dyspnea on exertion) 12/05/2021   Medicare annual wellness visit, subsequent 06/01/2021   Current use of proton pump inhibitor 06/01/2021   Hypothyroid 01/07/2020   History of breast cancer 12/05/2019   Prediabetes 07/04/2016   Routine general medical examination at a health care facility 07/04/2016   Encounter for hepatitis C screening test for low risk patient 06/29/2016   Estrogen deficiency 01/11/2016   Malignant neoplasm of upper-inner quadrant of right breast in female, estrogen receptor positive ([place]) 02/26/2015   Colon cancer screening 09/06/2012   Vitamin D deficiency 12/28/2009   STRESS REACTION, ACUTE, WITH EMOTIONAL DISTURBANCE 12/07/2009   HYPOGLYCEMIA NOS 05/28/2007   Vitamin B12 deficiency 03/07/2007   HYPERCHOLESTEROLEMIA  03/07/2007   BMI 40.0-44.9, adult ([place]) 03/07/2007   Essential hypertension 03/07/2007   GERD 03/07/2007   Past Medical History:  Diagnosis Date   Anemia    Anxiety    Arthritis    hands, knees   Breast cancer ([place])    Breast cancer of upper-inner quadrant of right female breast ([place]) 02/26/2015   Depression    Family history of adverse reaction to anesthesia    mother has had PONV   GERD (gastroesophageal reflux disease)    Hot flashes    Hyperlipidemia     Hypertension    Hypoglycemia    Hypothyroidism    Obesity    Personal history of radiation therapy    Vitamin B 12 deficiency    Past Surgical History:  Procedure Laterality Date   ABDOMINAL HYSTERECTOMY     BREAST BIOPSY     BREAST LUMPECTOMY Right    2016   BREAST REDUCTION SURGERY Bilateral 09/14/2020   Procedure: MAMMARY REDUCTION  (BREAST);  Surgeon: Irene Limbo, MD;  Location: [facility];  Service: Plastics;  Laterality: Bilateral;   GASTRIC BYPASS  08/2003   KNEE SURGERY     left   PARTIAL HYSTERECTOMY  1990's   bleeding   RADIOACTIVE SEED GUIDED PARTIAL MASTECTOMY WITH AXILLARY SENTINEL LYMPH NODE BIOPSY Right 03/09/2015   Procedure: RADIOACTIVE SEED GUIDED RIGHT PARTIAL MASTECTOMY WITH AXILLARY SENTINEL LYMPH NODE BIOPSY;  Surgeon: Stark Klein, MD;  Location: [facility];  Service: General;  Laterality: Right;   REDUCTION MAMMAPLASTY Bilateral 08/2020   TUBAL LIGATION     Social History   Tobacco Use   Smoking status: Never   Smokeless tobacco: Never  Vaping Use   Vaping Use: Never used  Substance Use Topics   Alcohol use: No   Drug use: No   Family History  Problem Relation Age of Onset   Heart disease Mother        MI   Hypertension Mother    Alcohol abuse Father    Heart disease Father        smoker   Hypertension Father    Breast cancer Paternal Grandmother    Diabetes Maternal Aunt    Allergies  Allergen Reactions   Crestor [Rosuvastatin Calcium] Other (See Comments)    Myalgia    Lipitor [Atorvastatin Calcium] Other (See Comments)    Aches and pains   Penicillins Shortness Of Breath and Swelling   Zocor [Simvastatin - High Dose] Other (See Comments)    Muscle/joint aches   Current Outpatient Medications on File Prior to Visit  Medication Sig Dispense Refill   acetaminophen (TYLENOL) 500 MG tablet Take 1,000 mg by mouth every 6 (six) hours as needed for moderate pain.     Biotin 10 MG TABS Take 10 mg by  mouth in the morning and at bedtime.     CALCIUM PO Take 1 tablet by mouth daily.     chlorthalidone (HYGROTON) 25 MG tablet Take 1 tablet (25 mg total) by mouth daily. 90 tablet 3   Cholecalciferol (D3 VITAMIN PO) Take 4,000 Units by mouth daily.     diclofenac Sodium (VOLTAREN) 1 % GEL Apply 2 g topically 4 (four) times daily as needed (pain).     iron polysaccharides (NIFEREX) 150 MG capsule Take 1 capsule (150 mg total) by mouth daily. 30 capsule 3   ketoconazole (NIZORAL) 2 % cream APPLY TOPICALLY DAILY (Patient taking differently: 1 Application daily as needed (irritation under breast).) 15  g 0   levothyroxine (SYNTHROID) 75 MCG tablet Take 1 tablet by mouth once daily 90 tablet 2   loratadine (CLARITIN) 10 MG tablet Take 10 mg by mouth daily as needed for allergies.     Multiple Vitamin (MULTIVITAMIN) capsule Take 1 capsule by mouth daily.     naproxen sodium (ALEVE) 220 MG tablet Take 220 mg by mouth daily as needed (pain).     omeprazole (PRILOSEC) 20 MG capsule Take 1 capsule (20 mg total) by mouth daily. 90 capsule 3   OVER THE COUNTER MEDICATION Take 1 capsule by mouth daily. InstaFlex     No current facility-administered medications on file prior to visit.      Review of Systems  Constitutional:  Negative for activity change, appetite change, fatigue, fever and unexpected weight change.       More energy  HENT:  Negative for congestion, ear pain, rhinorrhea, sinus pressure and sore throat.   Eyes:  Negative for pain, redness and visual disturbance.  Respiratory:  Negative for cough, shortness of breath and wheezing.   Cardiovascular:  Negative for chest pain and palpitations.  Gastrointestinal:  Negative for abdominal pain, blood in stool, constipation and diarrhea.       Some R abd fullness and pain if she eats wrong  Endocrine: Negative for polydipsia and polyuria.  Genitourinary:  Negative for dysuria, frequency and urgency.  Musculoskeletal:  Negative for arthralgias,  back pain and myalgias.  Skin:  Negative for pallor and rash.  Allergic/Immunologic: Negative for environmental allergies.  Neurological:  Negative for dizziness, syncope and headaches.  Hematological:  Negative for adenopathy. Does not bruise/bleed easily.  Psychiatric/Behavioral:  Negative for decreased concentration and dysphoric mood. The patient is not nervous/anxious.        Objective:   Physical Exam Constitutional:      General: She is not in acute distress.    Appearance: Normal appearance. She is well-developed. She is obese. She is not ill-appearing or diaphoretic.  HENT:     Head: Normocephalic and atraumatic.     Mouth/Throat:     Mouth: Mucous membranes are moist.  Eyes:     General: No scleral icterus.    Conjunctiva/sclera: Conjunctivae normal.     Pupils: Pupils are equal, round, and reactive to light.  Neck:     Thyroid: No thyromegaly.     Vascular: No carotid bruit or JVD.  Cardiovascular:     Rate and Rhythm: Normal rate and regular rhythm.     Heart sounds: Normal heart sounds.     No gallop.  Pulmonary:     Effort: Pulmonary effort is normal. No respiratory distress.     Breath sounds: Normal breath sounds. No wheezing or rales.  Abdominal:     General: There is no distension or abdominal bruit.     Palpations: Abdomen is soft.     Tenderness: There is no abdominal tenderness. There is no rebound.  Musculoskeletal:     Cervical back: Normal range of motion and neck supple.     Right lower leg: No edema.     Left lower leg: No edema.  Lymphadenopathy:     Cervical: No cervical adenopathy.  Skin:    General: Skin is warm and dry.     Coloration: Skin is not jaundiced or pale.     Findings: No erythema or rash.  Neurological:     Mental Status: She is alert.     Coordination: Coordination normal.  Deep Tendon Reflexes: Reflexes are normal and symmetric. Reflexes normal.  Psychiatric:        Mood and Affect: Mood normal.            Assessment & Plan:   Problem List Items Addressed This Visit       Cardiovascular and Mediastinum   Essential hypertension - Primary    bp is improved but not yet to goal BP Readings from Last 1 Encounters:  03/29/22 (!) 146/80  Plan to continue chlorthalidone 25 mg daily Lab today for bmet-if nl will consider adding losartan Most recent labs reviewed  Disc lifstyle change with low sodium diet and exercise   Would like bp optimized before gb surgery       Relevant Orders   Basic metabolic panel     Digestive   Gallstones    Clinical improvement with tx of pna, cxr today  Working on bp control so we can clear for surgery   Pt is eating low fat and has less symptoms          Other   Iron deficiency anemia    Due to malabsorb from past bariatric surgery  Seeing hematology and getting iron infusions Feels much better  Last Hb 12.3        Opacity of lung on imaging study    RML Treated with doxycycline with much clinical improvement Nl exam  cxr today   In process of clearing for gb surgery       Relevant Orders   DG Chest 2 View   Pedal edema    Improved with chlorthalidone (working better than hctz) bmet today

## 2022-03-29 NOTE — Assessment & Plan Note (Signed)
Improved with chlorthalidone (working better than hctz) bmet today

## 2022-03-29 NOTE — Assessment & Plan Note (Signed)
bp is improved but not yet to goal BP Readings from Last 1 Encounters:  03/29/22 (!) 146/80   Plan to continue chlorthalidone 25 mg daily Lab today for bmet-if nl will consider adding losartan Most recent labs reviewed  Disc lifstyle change with low sodium diet and exercise   Would like bp optimized before gb surgery

## 2022-03-30 NOTE — Addendum Note (Signed)
Addended by: Loura Pardon A on: 03/30/2022 05:23 PM   Modules accepted: Orders

## 2022-03-31 ENCOUNTER — Other Ambulatory Visit: Payer: Self-pay | Admitting: Family Medicine

## 2022-03-31 DIAGNOSIS — Z1231 Encounter for screening mammogram for malignant neoplasm of breast: Secondary | ICD-10-CM

## 2022-04-06 DIAGNOSIS — Z961 Presence of intraocular lens: Secondary | ICD-10-CM | POA: Diagnosis not present

## 2022-04-07 ENCOUNTER — Ambulatory Visit
Admission: RE | Admit: 2022-04-07 | Discharge: 2022-04-07 | Disposition: A | Discharge status: Home / Self Care | Payer: Medicare Other | Source: Ambulatory Visit | Attending: Family Medicine | Admitting: Family Medicine

## 2022-04-07 DIAGNOSIS — R918 Other nonspecific abnormal finding of lung field: Secondary | ICD-10-CM

## 2022-04-07 DIAGNOSIS — R911 Solitary pulmonary nodule: Secondary | ICD-10-CM | POA: Diagnosis not present

## 2022-04-10 ENCOUNTER — Telehealth: Payer: Self-pay | Admitting: Family Medicine

## 2022-04-10 ENCOUNTER — Telehealth: Payer: Self-pay | Admitting: *Deleted

## 2022-04-10 DIAGNOSIS — E041 Nontoxic single thyroid nodule: Secondary | ICD-10-CM | POA: Insufficient documentation

## 2022-04-10 NOTE — Telephone Encounter (Signed)
Aware, thanks  Please see result note

## 2022-04-10 NOTE — Telephone Encounter (Signed)
-----   Message from Jennifer Bartlett, [place] sent at 04/10/2022 12:53 PM EDT ----- Pt viewed results and Dr. Marliss Coots comments via mychart. Pt does want to proceed with [place] of thyroid. Would like to have it done in [place] if possible.   Pt has an appt with Dede Query on Wednesday and will try to get an appt with a new oncologist at that time (Old one retired) no referral needed. She did ask me to send results to Dede Query so they are aware of what's going on since she has an appt Wednesday

## 2022-04-10 NOTE — Telephone Encounter (Signed)
I put the referral in  Call us if you don't hear in 1-2 weeks

## 2022-04-10 NOTE — Telephone Encounter (Signed)
Ray from [facility] called to let Dr. Glori Bickers know that the CT results are in Epic for the doctor to review.

## 2022-04-11 ENCOUNTER — Other Ambulatory Visit: Payer: Medicare Other

## 2022-04-12 ENCOUNTER — Other Ambulatory Visit: Payer: Self-pay | Admitting: Physician Assistant

## 2022-04-12 ENCOUNTER — Inpatient Hospital Stay: Payer: Medicare Other | Attending: Physician Assistant

## 2022-04-12 ENCOUNTER — Inpatient Hospital Stay (HOSPITAL_BASED_OUTPATIENT_CLINIC_OR_DEPARTMENT_OTHER): Payer: Medicare Other | Admitting: Physician Assistant

## 2022-04-12 ENCOUNTER — Other Ambulatory Visit: Payer: Self-pay

## 2022-04-12 VITALS — BP 145/67 | HR 72 | Temp 97.9°F | Resp 18 | Ht 64.0 in | Wt 225.5 lb

## 2022-04-12 DIAGNOSIS — N6323 Unspecified lump in the left breast, lower outer quadrant: Secondary | ICD-10-CM | POA: Diagnosis not present

## 2022-04-12 DIAGNOSIS — D509 Iron deficiency anemia, unspecified: Secondary | ICD-10-CM | POA: Diagnosis not present

## 2022-04-12 DIAGNOSIS — Z853 Personal history of malignant neoplasm of breast: Secondary | ICD-10-CM | POA: Diagnosis not present

## 2022-04-12 DIAGNOSIS — R590 Localized enlarged lymph nodes: Secondary | ICD-10-CM | POA: Diagnosis not present

## 2022-04-12 LAB — IRON AND IRON BINDING CAPACITY (CC-WL,HP ONLY)
Iron: 73 ug/dL (ref 28–170)
Saturation Ratios: 21 % (ref 10.4–31.8)
TIBC: 351 ug/dL (ref 250–450)
UIBC: 278 ug/dL (ref 148–442)

## 2022-04-12 LAB — CMP (CANCER CENTER ONLY)
ALT: 17 U/L (ref 0–44)
AST: 20 U/L (ref 15–41)
Albumin: 4.1 g/dL (ref 3.5–5.0)
Alkaline Phosphatase: 85 U/L (ref 38–126)
Anion gap: 7 (ref 5–15)
BUN: 23 mg/dL (ref 8–23)
CO2: 33 mmol/L — ABNORMAL HIGH (ref 22–32)
Calcium: 9.7 mg/dL (ref 8.9–10.3)
Chloride: 98 mmol/L (ref 98–111)
Creatinine: 0.88 mg/dL (ref 0.44–1.00)
GFR, Estimated: >60 mL/min (ref >60)
Glucose, Bld: 124 mg/dL — ABNORMAL HIGH (ref 70–99)
Potassium: 3.6 mmol/L (ref 3.5–5.1)
Sodium: 138 mmol/L (ref 135–145)
Total Bilirubin: 0.3 mg/dL (ref 0.3–1.2)
Total Protein: 7.2 g/dL (ref 6.5–8.1)

## 2022-04-12 LAB — CBC WITH DIFFERENTIAL (CANCER CENTER ONLY)
Abs Immature Granulocytes: 0.02 10*3/uL (ref 0.00–0.07)
Basophils Absolute: 0.1 10*3/uL (ref 0.0–0.1)
Basophils Relative: 1 %
Eosinophils Absolute: 0.3 10*3/uL (ref 0.0–0.5)
Eosinophils Relative: 4 %
HCT: 39.7 % (ref 36.0–46.0)
Hemoglobin: 13.1 g/dL (ref 12.0–15.0)
Immature Granulocytes: 0 %
Lymphocytes Relative: 21 %
Lymphs Abs: 1.7 10*3/uL (ref 0.7–4.0)
MCH: 29.8 pg (ref 26.0–34.0)
MCHC: 33 g/dL (ref 30.0–36.0)
MCV: 90.4 fL (ref 80.0–100.0)
Monocytes Absolute: 0.7 10*3/uL (ref 0.1–1.0)
Monocytes Relative: 8 %
Neutro Abs: 5.5 10*3/uL (ref 1.7–7.7)
Neutrophils Relative %: 66 %
Platelet Count: 277 10*3/uL (ref 150–400)
RBC: 4.39 MIL/uL (ref 3.87–5.11)
RDW: 17.4 % — ABNORMAL HIGH (ref 11.5–15.5)
WBC Count: 8.3 10*3/uL (ref 4.0–10.5)
nRBC: 0 % (ref 0.0–0.2)

## 2022-04-12 LAB — FERRITIN: Ferritin: 56 ng/mL (ref 11–307)

## 2022-04-12 LAB — VITAMIN B12: Vitamin B-12: 497 pg/mL (ref 180–914)

## 2022-04-12 NOTE — Progress Notes (Signed)
[place] Telephone:([phone]) [phone]   Fax:([phone]) [phone]  PROGRESS NOTE  Patient Care Team: Tower, Wynelle Fanny, MD as PCP - General Annamaria Boots Candiss Norse, NP (Inactive) as Nurse Practitioner (Obstetrics and Gynecology) Stark Klein, MD as Consulting Physician (General Surgery) Mauro Kaufmann, RN as Registered Nurse Rockwell Germany, RN as Registered Nurse Jake Shark, Johny Blamer, NP as Nurse Practitioner (Nurse Practitioner) Vickey Huger, MD as Consulting Physician (Orthopedic Surgery)  CHIEF COMPLAINTS/PURPOSE OF CONSULTATION:  -Iron deficiency anemia -History of right breast cancer -Left axillary lymphadenopathy  HISTORY OF PRESENTING ILLNESS:  Jennifer Bartlett 76 y.o. female returns for follow-up for iron deficiency anemia and recent CT imaging concerning for left axillary lymphadenopathy.  Patient was last seen on 01/10/2022 to establish care for iron deficiency anemia.  In the interim, patient received IV Feraheme 510 mg x 2 doses completed in January 25, 2022.  On review of the previous records, Ms. Nielson underwent CT imaging of the chest on 04/07/2022 to evaluate for possible pneumonia.  Findings revealed abnormal left axillary lymphadenopathy measuring up to 16 mm.  On exam today, Ms. [place] reports her energy levels have markedly improved since receiving IV iron infusions.  She is able to complete all her daily activities on her own.  She reports feeling a palpable lump on her right breast for the last 2 months that has increased in size.  She has not felt any lumps or masses in her left axillary region but has felt some discomfort.  She denies any appetite changes but has lost approximately 10 pounds since August 2023.  She denies nausea, vomiting or abdominal pain.  Her bowel habits are unchanged without any recurrent episodes of diarrhea or constipation.She denies easy bruising or signs of active bleeding. She denies fevers, chills, sweats, shortness of breath, chest pain  or cough. She has no other complaints. Rest of the 10 point ROS is below.   MEDICAL HISTORY:  Past Medical History:  Diagnosis Date   Anemia    Anxiety    Arthritis    hands, knees   Breast cancer ([place])    Breast cancer of upper-inner quadrant of right female breast ([place]) 02/26/2015   Depression    Family history of adverse reaction to anesthesia    mother has had PONV   GERD (gastroesophageal reflux disease)    Hot flashes    Hyperlipidemia    Hypertension    Hypoglycemia    Hypothyroidism    Obesity    Personal history of radiation therapy    Vitamin B 12 deficiency     SURGICAL HISTORY: Past Surgical History:  Procedure Laterality Date   ABDOMINAL HYSTERECTOMY     BREAST BIOPSY     BREAST LUMPECTOMY Right    2016   BREAST REDUCTION SURGERY Bilateral 09/14/2020   Procedure: MAMMARY REDUCTION  (BREAST);  Surgeon:  Limbo, MD;  Location: [facility];  Service: Plastics;  Laterality: Bilateral;   GASTRIC BYPASS  08/2003   KNEE SURGERY     left   PARTIAL HYSTERECTOMY  1990's   bleeding   RADIOACTIVE SEED GUIDED PARTIAL MASTECTOMY WITH AXILLARY SENTINEL LYMPH NODE BIOPSY Right 03/09/2015   Procedure: RADIOACTIVE SEED GUIDED RIGHT PARTIAL MASTECTOMY WITH AXILLARY SENTINEL LYMPH NODE BIOPSY;  Surgeon: Stark Klein, MD;  Location: [facility];  Service: General;  Laterality: Right;   REDUCTION MAMMAPLASTY Bilateral 08/2020   TUBAL LIGATION      SOCIAL HISTORY: Social History   Socioeconomic History  Marital status: Married    Spouse name: Not on file   Number of children: 3   Years of education: Not on file   Highest education level: Not on file  Occupational History    Employer: [employer]  Tobacco Use   Smoking status: Never   Smokeless tobacco: Never  Vaping Use   Vaping Use: Never used  Substance and Sexual Activity   Alcohol use: No   Drug use: No   Sexual activity: Yes    Partners: Male    Birth  control/protection: Surgical    Comment: Hyst.  Other Topics Concern   Not on file  Social History Narrative   Regular walking for exercise.   Social Determinants of Health   Financial Resource Strain: Low Risk  (03/26/2020)   Overall Financial Resource Strain (CARDIA)    Difficulty of Paying Living Expenses: Not hard at all  Food Insecurity: No Food Insecurity (03/26/2020)   Hunger Vital Sign    Worried About Running Out of Food in the Last Year: Never true    Ran Out of Food in the Last Year: Never true  Transportation Needs: No Transportation Needs (03/26/2020)   PRAPARE - Hydrologist (Medical): No    Lack of Transportation (Non-Medical): No  Physical Activity: Insufficiently Active (03/26/2020)   Exercise Vital Sign    Days of Exercise per Week: 3 days    Minutes of Exercise per Session: 20 min  Stress: No Stress Concern Present (03/26/2020)   [place]    Feeling of Stress : Not at all  Social Connections: Not on file  Intimate Partner Violence: Not At Risk (03/26/2020)   Humiliation, Afraid, Rape, and Kick questionnaire    Fear of Current or Ex-Partner: No    Emotionally Abused: No    Physically Abused: No    Sexually Abused: No    FAMILY HISTORY: Family History  Problem Relation Age of Onset   Heart disease Mother        MI   Hypertension Mother    Alcohol abuse Father    Heart disease Father        smoker   Hypertension Father    Breast cancer Paternal Grandmother    Diabetes Maternal Aunt     ALLERGIES:  is allergic to crestor [rosuvastatin calcium], lipitor [atorvastatin calcium], penicillins, and zocor [simvastatin - high dose].  MEDICATIONS:  Current Outpatient Medications  Medication Sig Dispense Refill   acetaminophen (TYLENOL) 500 MG tablet Take 1,000 mg by mouth every 6 (six) hours as needed for moderate pain.     Biotin 10 MG TABS Take 10 mg by mouth in  the morning and at bedtime.     CALCIUM PO Take 1 tablet by mouth daily.     chlorthalidone (HYGROTON) 25 MG tablet Take 1 tablet (25 mg total) by mouth daily. 90 tablet 3   Cholecalciferol (D3 VITAMIN PO) Take 4,000 Units by mouth daily.     diclofenac Sodium (VOLTAREN) 1 % GEL Apply 2 g topically 4 (four) times daily as needed (pain).     hydrochlorothiazide (HYDRODIURIL) 25 MG tablet Take 25 mg by mouth daily.     iron polysaccharides (NIFEREX) 150 MG capsule Take 1 capsule (150 mg total) by mouth daily. 30 capsule 3   ketoconazole (NIZORAL) 2 % cream APPLY TOPICALLY DAILY (Patient taking differently: 1 Application daily as needed (irritation under breast).) 15 g 0  levothyroxine (SYNTHROID) 75 MCG tablet Take 1 tablet by mouth once daily 90 tablet 2   loratadine (CLARITIN) 10 MG tablet Take 10 mg by mouth daily as needed for allergies.     Multiple Vitamin (MULTIVITAMIN) capsule Take 1 capsule by mouth daily.     naproxen sodium (ALEVE) 220 MG tablet Take 220 mg by mouth daily as needed (pain).     omeprazole (PRILOSEC) 20 MG capsule Take 1 capsule (20 mg total) by mouth daily. 90 capsule 3   OVER THE COUNTER MEDICATION Take 1 capsule by mouth daily. InstaFlex     No current facility-administered medications for this visit.    REVIEW OF SYSTEMS:   Constitutional: ( - ) fevers, ( - )  chills , ( - ) night sweats Eyes: ( - ) blurriness of vision, ( - ) double vision, ( - ) watery eyes Ears, nose, mouth, throat, and face: ( - ) mucositis, ( - ) sore throat Respiratory: ( - ) cough, (- ) dyspnea, ( - ) wheezes Cardiovascular: ( - ) palpitation, ( - ) chest discomfort, ( - ) lower extremity swelling Gastrointestinal:  ( - ) nausea, ( - ) heartburn, ( - ) change in bowel habits Skin: ( - ) abnormal skin rashes Lymphatics: ( - ) new lymphadenopathy, ( - ) easy bruising Neurological: ( - ) numbness, ( - ) tingling, ( - ) new weaknesses Behavioral/Psych: ( - ) mood change, ( - ) new changes   All other systems were reviewed with the patient and are negative.  PHYSICAL EXAMINATION: ECOG PERFORMANCE STATUS: 1 - Symptomatic but completely ambulatory  Vitals:   04/12/22 0934  BP: (!) 145/67  Pulse: 72  Resp: 18  Temp: 97.9 F (36.6 C)  SpO2: 98%   Filed Weights   04/12/22 0934  Weight: 225 lb 8 oz (102.3 kg)    GENERAL: well appearing female in NAD  SKIN: skin color, texture, turgor are normal, no rashes or significant lesions EYES: conjunctiva are pink and non-injected, sclera clear OROPHARYNX: no exudate, no erythema; lips, buccal mucosa, and tongue normal  NECK: supple, non-tender LYMPH:  no palpable lymphadenopathy in the cervical, supraclavicular lymph nodes.  LUNGS: clear to auscultation and percussion with normal breathing effort HEART: regular rate & rhythm and no murmurs and no lower extremity edema ABDOMEN: soft, non-tender, non-distended, normal bowel sounds Musculoskeletal: no cyanosis of digits and no clubbing  PSYCH: alert & oriented x 3, fluent speech NEURO: no focal motor/sensory deficits BREAST: Palpable mass in the right outer lower quadrant.  LABORATORY DATA:  I have reviewed the data as listed    Latest Ref Rng & Units 04/12/2022    9:07 AM 03/02/2022    2:17 PM 01/10/2022   12:10 PM  CBC  WBC 4.0 - 10.5 K/uL 8.3  8.4  9.0   Hemoglobin 12.0 - 15.0 g/dL 13.1  12.3  9.4   Hematocrit 36.0 - 46.0 % 39.7  38.5  30.9   Platelets 150 - 400 K/uL 277  334  396        Latest Ref Rng & Units 04/12/2022    9:07 AM 03/29/2022    9:19 AM 03/06/2022   12:58 PM  CMP  Glucose 70 - 99 mg/dL 124  106  93   BUN 8 - 23 mg/dL '23  21  22   '$ Creatinine 0.44 - 1.00 mg/dL 0.88  0.92  0.95   Sodium 135 - 145 mmol/L 138  136  139   Potassium 3.5 - 5.1 mmol/L 3.6  3.5  4.6   Chloride 98 - 111 mmol/L 98  96  98   CO2 22 - 32 mmol/L 33  30  28   Calcium 8.9 - 10.3 mg/dL 9.7  9.4  9.7   Total Protein 6.5 - 8.1 g/dL 7.2   7.1   Total Bilirubin 0.3 - 1.2 mg/dL 0.3    0.3   Alkaline Phos 38 - 126 U/L 85     AST 15 - 41 U/L 20   17   ALT 0 - 44 U/L 17   15     ASSESSMENT & PLAN SANI LOISEAU is a 76 y.o. female returns for follow-up.  #Iron deficiency anemia: --Secondary to malasorbption due to bariatric surgery --Placed referral to GI but consultation never took place. Consider re-referral if iron deficiency reoccurs.  --Currently on iron pills, okay to discontinue as patient is unlikely to absorb.  --Received IV feraheme 510 mg x 2 doses on 01/18/2022 and 01/25/2022.  --Labs today show anemia has resolved with Hgb 13.1. Iron levels show no evidence of deficiency.  --No need for additional IV iron infusions at this time.  --Recheck iron levels in 3 months.   #H/O bariatric surgery: --B12 levels normal today.  --Recheck periodically, every 6 months.   #H/O right breast cancer: --Medical oncologist was Dr. Jana Hakim. Completed treatment in October 2016 and completed 5 years of Tamoxifen therapy in January 2022.  --Currently on surveillance with PCP.  --Last mammogram was 06/2021 which was negative for malignancy  #Left breast mass and left axillary lymphadenopathy: --CT chest from 04/07/2022 showed numerous enlarged and nonenlarged left axillary lymph nodes measuring up to 16 mm short axis --Physical exam findings were concerning for palpable mass in the outer lower quadrant of the left breast.  --Requested STAT mammogram and left axillary [place] with biopsy scheduled for 04/20/2022 --If there is evidence of recurrence, we will request a follow up with breast medical oncologist since Dr. Jana Hakim has retired.   Orders Placed This Encounter  Procedures   [place] CORE BIOPSY (LYMPH NODES)    [facility] MCR  12/27/22BCG left axillary lymphadenopathy/ NO NEEDS ;/  NO BR SXS/TOMO/  RG W/ HELEN @ [facility] PT AWARE OF $75 NO SHOW/CX FEE      Standing Status:   Future    Standing Expiration Date:   04/13/2023    Order Specific Question:   Lab orders requested (DO  NOT place separate lab orders, these will be automatically ordered during procedure specimen collection):    Answer:   Surgical Pathology    Order Specific Question:   Reason for Exam (SYMPTOM  OR DIAGNOSIS REQUIRED)    Answer:   left axillary lymphadenopathy    Order Specific Question:   Preferred location?    Answer:   [facility]   US BREAST LTD UNI LEFT INC AXILLA    [facility] MCR  12/27/22BCG left axillary lymphadenopathy/ NO NEEDS ;/  NO BR SXS/TOMO/  RG W/ HELEN @ [facility] PT AWARE OF $75 NO SHOW/CX FEE      Standing Status:   Future    Standing Expiration Date:   04/13/2023    Order Specific Question:   Reason for Exam (SYMPTOM  OR DIAGNOSIS REQUIRED)    Answer:   history of right breast cancer, new left lymphadenopathy    Order Specific Question:   Preferred imaging location?    Answer:   [facility]  Order Specific Question:   Call Results- Best Contact Number?    Answer:   left axillary lymphadenopathy, palpable left breast mass. history of right breast cancer   MM DIAG BREAST TOMO UNI LEFT    [facility] MCR  12/27/22BCG left axillary lymphadenopathy/ NO NEEDS ;/  NO BR SXS/TOMO/  RG W/ HELEN @ [facility] PT AWARE OF $75 NO SHOW/CX FEE      Standing Status:   Future    Standing Expiration Date:   04/12/2023    Order Specific Question:   Reason for Exam (SYMPTOM  OR DIAGNOSIS REQUIRED)    Answer:   left axillary lymphadenopathy, palpable left breast mass. history of right breast cancer    Order Specific Question:   Preferred imaging location?    Answer:   [facility]    All questions were answered. The patient knows to call the clinic with any problems, questions or concerns.  I have spent a total of 45 minutes minutes of face-to-face and non-face-to-face time, preparing to see the patient, performing a medically appropriate examination, counseling and educating the patient, ordering medications/tests/procedures, documenting clinical information in the electronic health  record, independently interpreting results and communicating results to the patient, and care coordination.   Dede Query, PA-C Department of Hematology/Oncology [place] at [facility] Phone: [phone]

## 2022-04-13 ENCOUNTER — Telehealth: Payer: Self-pay | Admitting: Family Medicine

## 2022-04-13 ENCOUNTER — Ambulatory Visit
Admission: RE | Admit: 2022-04-13 | Discharge: 2022-04-13 | Disposition: A | Discharge status: Home / Self Care | Payer: Medicare Other | Source: Ambulatory Visit | Attending: Family Medicine | Admitting: Family Medicine

## 2022-04-13 DIAGNOSIS — E041 Nontoxic single thyroid nodule: Secondary | ICD-10-CM

## 2022-04-13 NOTE — Assessment & Plan Note (Signed)
[place] 03/2022 Meets criteria for re imaging  in a year with [place]

## 2022-04-13 NOTE — Telephone Encounter (Signed)
Thyroid ultrasound notes one nodule needs re imaging in a year  Please let pt know  This is reassuring    US THYROID (Accession [id number]) (Order [id number]) Imaging Date: 04/13/2022 Department: [facility] [place] Released By: Lenard Galloway Authorizing: Masiya Claassen, Wynelle Fanny, MD   Exam Status  Status  Final [99]   PACS Intelerad Image Link   Show images for US THYROID  Study Result  Narrative & Impression  CLINICAL DATA:  Incidental on CT.   EXAM: THYROID ULTRASOUND   TECHNIQUE: Ultrasound examination of the thyroid gland and adjacent soft tissues was performed.   COMPARISON:  CT September 2023   FINDINGS: Parenchymal Echotexture: Mildly heterogenous   Isthmus: 0.5 cm   Right lobe: 4.4 x 2.2 x 1.5 cm   Left lobe: 5.4 x 2.1 x 1.9 cm   _________________________________________________________   Estimated total number of nodules >/= 1 cm: 1   Number of spongiform nodules >/=  2 cm not described below (TR1): 0   Number of mixed cystic and solid nodules >/= 1.5 cm not described below (TR2): 0   _________________________________________________________   Nodule labeled 1 is a small subcentimeter anechoic cystic versus spongiform TR 1 nodule in the inferior right thyroid lobe. This nodule does NOT meet TI-RADS criteria for biopsy or dedicated follow-up.   Nodule labeled 2 is a predominantly solid isoechoic TR 3 nodule in the inferior left thyroid lobe measuring 2.2 x 2.1 x 1.7 cm. *Given size (>/= 1.5 - 2.4 cm) and appearance, a follow-up ultrasound in 1 year should be considered based on TI-RADS criteria.   IMPRESSION: 1. Multinodular thyroid gland as described. 2. Nodule labeled 2 (2.2 cm TR 3) in the inferior left thyroid lobe meets criteria for follow-up ultrasound in 1 year. A total follow-up interval of 5 years is recommended, with this exam serving as a baseline.

## 2022-04-14 NOTE — Telephone Encounter (Signed)
Pt notified of [place] results and Dr. Marliss Coots comments

## 2022-04-16 ENCOUNTER — Other Ambulatory Visit: Payer: Self-pay | Admitting: Family Medicine

## 2022-04-20 ENCOUNTER — Ambulatory Visit
Admission: RE | Admit: 2022-04-20 | Discharge: 2022-04-20 | Disposition: A | Discharge status: Home / Self Care | Payer: Medicare Other | Source: Ambulatory Visit | Attending: Physician Assistant | Admitting: Physician Assistant

## 2022-04-20 ENCOUNTER — Other Ambulatory Visit: Payer: Self-pay | Admitting: Physician Assistant

## 2022-04-20 ENCOUNTER — Telehealth: Payer: Self-pay | Admitting: Family Medicine

## 2022-04-20 DIAGNOSIS — N6324 Unspecified lump in the left breast, lower inner quadrant: Secondary | ICD-10-CM | POA: Diagnosis not present

## 2022-04-20 DIAGNOSIS — N6323 Unspecified lump in the left breast, lower outer quadrant: Secondary | ICD-10-CM | POA: Diagnosis not present

## 2022-04-20 DIAGNOSIS — Z853 Personal history of malignant neoplasm of breast: Secondary | ICD-10-CM | POA: Diagnosis not present

## 2022-04-20 DIAGNOSIS — R922 Inconclusive mammogram: Secondary | ICD-10-CM | POA: Diagnosis not present

## 2022-04-20 DIAGNOSIS — R590 Localized enlarged lymph nodes: Secondary | ICD-10-CM

## 2022-04-20 MED ORDER — ALPRAZOLAM 0.5 MG PO TABS: ORAL_TABLET | ORAL | 0 refills | Status: DC

## 2022-04-20 NOTE — Telephone Encounter (Signed)
Patient is having having left breast biopsy on Thursday 9/28 at 1230,and she would like to receive an rx to calm her before her procedure.

## 2022-04-20 NOTE — Telephone Encounter (Signed)
I sent xanax to her [facility] Caution of sedation

## 2022-04-21 ENCOUNTER — Other Ambulatory Visit: Payer: Medicare Other

## 2022-04-21 NOTE — Telephone Encounter (Signed)
Pt notified Rx sent to pharmacy and sedation warning given

## 2022-04-27 ENCOUNTER — Ambulatory Visit
Admission: RE | Admit: 2022-04-27 | Discharge: 2022-04-27 | Disposition: A | Discharge status: Home / Self Care | Payer: Medicare Other | Source: Ambulatory Visit | Attending: Physician Assistant | Admitting: Physician Assistant

## 2022-04-27 ENCOUNTER — Other Ambulatory Visit (HOSPITAL_COMMUNITY)
Admission: RE | Admit: 2022-04-27 | Discharge: 2022-04-27 | Disposition: A | Discharge status: Home / Self Care | Payer: Medicare Other | Source: Ambulatory Visit | Attending: Physician Assistant | Admitting: Physician Assistant

## 2022-04-27 ENCOUNTER — Encounter: Payer: Self-pay | Admitting: Family Medicine

## 2022-04-27 DIAGNOSIS — C9110 Chronic lymphocytic leukemia of B-cell type not having achieved remission: Secondary | ICD-10-CM | POA: Diagnosis not present

## 2022-04-27 DIAGNOSIS — D479 Neoplasm of uncertain behavior of lymphoid, hematopoietic and related tissue, unspecified: Secondary | ICD-10-CM | POA: Diagnosis not present

## 2022-04-27 DIAGNOSIS — R590 Localized enlarged lymph nodes: Secondary | ICD-10-CM

## 2022-04-27 DIAGNOSIS — N6323 Unspecified lump in the left breast, lower outer quadrant: Secondary | ICD-10-CM

## 2022-04-27 DIAGNOSIS — R7989 Other specified abnormal findings of blood chemistry: Secondary | ICD-10-CM | POA: Insufficient documentation

## 2022-05-01 LAB — SURGICAL PATHOLOGY

## 2022-05-02 ENCOUNTER — Other Ambulatory Visit: Payer: Medicare Other

## 2022-05-02 ENCOUNTER — Telehealth: Payer: Self-pay | Admitting: Physician Assistant

## 2022-05-02 DIAGNOSIS — C8300 Small cell B-cell lymphoma, unspecified site: Secondary | ICD-10-CM

## 2022-05-02 NOTE — Telephone Encounter (Addendum)
I called and spoke to [name] to go over his lymph node biopsy results.  Pathology confirmed small lymphocytic lymphoma.  Patient will come in tomorrow for labs to check prognostic panel.  She will follow-up with Dr. Lorenso Courier in 2 weeks to discuss diagnosis in more detail and recommendations for observation versus starting therapy.  She expressed understanding of the plan provided.

## 2022-05-03 ENCOUNTER — Other Ambulatory Visit: Payer: Self-pay

## 2022-05-03 ENCOUNTER — Encounter: Payer: Self-pay | Admitting: Hematology and Oncology

## 2022-05-03 ENCOUNTER — Inpatient Hospital Stay: Payer: Medicare Other | Attending: Physician Assistant

## 2022-05-03 DIAGNOSIS — C8300 Small cell B-cell lymphoma, unspecified site: Secondary | ICD-10-CM | POA: Diagnosis not present

## 2022-05-03 DIAGNOSIS — C9110 Chronic lymphocytic leukemia of B-cell type not having achieved remission: Secondary | ICD-10-CM | POA: Insufficient documentation

## 2022-05-03 DIAGNOSIS — Z853 Personal history of malignant neoplasm of breast: Secondary | ICD-10-CM | POA: Insufficient documentation

## 2022-05-03 DIAGNOSIS — D509 Iron deficiency anemia, unspecified: Secondary | ICD-10-CM | POA: Insufficient documentation

## 2022-05-09 LAB — FISH,CLL PROGNOSTIC PANEL

## 2022-05-11 ENCOUNTER — Encounter: Payer: Self-pay | Admitting: Hematology and Oncology

## 2022-05-11 LAB — ZAP-70 PANEL (ASR)

## 2022-05-12 LAB — IGVH SOMATIC HYPERMUTATION

## 2022-05-15 ENCOUNTER — Ambulatory Visit: Payer: Medicare Other | Admitting: Hematology and Oncology

## 2022-05-16 ENCOUNTER — Ambulatory Visit: Payer: Medicare Other | Admitting: Hematology and Oncology

## 2022-05-17 ENCOUNTER — Inpatient Hospital Stay: Payer: Medicare Other | Admitting: Hematology and Oncology

## 2022-05-17 ENCOUNTER — Other Ambulatory Visit: Payer: Self-pay

## 2022-05-17 VITALS — BP 190/71 | HR 81 | Temp 97.8°F | Resp 16 | Wt 227.5 lb

## 2022-05-17 DIAGNOSIS — D509 Iron deficiency anemia, unspecified: Secondary | ICD-10-CM | POA: Diagnosis not present

## 2022-05-17 DIAGNOSIS — C8300 Small cell B-cell lymphoma, unspecified site: Secondary | ICD-10-CM

## 2022-05-17 DIAGNOSIS — R590 Localized enlarged lymph nodes: Secondary | ICD-10-CM

## 2022-05-17 DIAGNOSIS — Z853 Personal history of malignant neoplasm of breast: Secondary | ICD-10-CM | POA: Diagnosis not present

## 2022-05-17 DIAGNOSIS — C9110 Chronic lymphocytic leukemia of B-cell type not having achieved remission: Secondary | ICD-10-CM | POA: Diagnosis not present

## 2022-05-17 NOTE — Progress Notes (Signed)
[place] [phone]   [phone]  PROGRESS NOTE  Patient Care Team: Tower, Wynelle Fanny, MD as PCP - General Annamaria Boots Candiss Norse, NP (Inactive) as Nurse Practitioner (Obstetrics and Gynecology) Stark Klein, MD as Consulting Physician (General Surgery) Mauro Kaufmann, RN as Registered Nurse Rockwell Germany, RN as Registered Nurse Jake Shark, Johny Blamer, NP as Nurse Practitioner (Nurse Practitioner) Vickey Huger, MD as Consulting Physician (Orthopedic Surgery)  CHIEF COMPLAINTS/PURPOSE OF CONSULTATION:  -Iron deficiency anemia -History of right breast cancer -Left axillary lymphadenopathy--biopsy-proven CLL/SLL  HISTORY OF PRESENTING ILLNESS:  Jennifer Bartlett 76 y.o. female returns for follow-up to discuss the diagnosis of CLL/SLL and the plan moving forward.  On exam today, Jennifer Bartlett reports she has had no major changes in her health in the interim since her last visit.  She denies fevers, chills, sweats, shortness of breath, chest pain or cough. She has no other complaints. Rest of the 10 point ROS is below.   The bulk of our discussion focused on the diagnosis of CLL/SLL and the steps moving forward.  Details of this conversation are noted below.  MEDICAL HISTORY:  Past Medical History:  Diagnosis Date   Anemia    Anxiety    Arthritis    hands, knees   Breast cancer ([place])    Breast cancer of upper-inner quadrant of right female breast ([place]) 02/26/2015   Depression    Family history of adverse reaction to anesthesia    mother has had PONV   GERD (gastroesophageal reflux disease)    Hot flashes    Hyperlipidemia    Hypertension    Hypoglycemia    Hypothyroidism    Obesity    Personal history of radiation therapy    Vitamin B 12 deficiency     SURGICAL HISTORY: Past Surgical History:  Procedure Laterality Date   ABDOMINAL HYSTERECTOMY     BREAST BIOPSY     BREAST LUMPECTOMY Right    2016   BREAST REDUCTION SURGERY  Bilateral 09/14/2020   Procedure: MAMMARY REDUCTION  (BREAST);  Surgeon: Irene Limbo, MD;  Location: [facility];  Service: Plastics;  Laterality: Bilateral;   GASTRIC BYPASS  08/2003   KNEE SURGERY     left   PARTIAL HYSTERECTOMY  1990's   bleeding   RADIOACTIVE SEED GUIDED PARTIAL MASTECTOMY WITH AXILLARY SENTINEL LYMPH NODE BIOPSY Right 03/09/2015   Procedure: RADIOACTIVE SEED GUIDED RIGHT PARTIAL MASTECTOMY WITH AXILLARY SENTINEL LYMPH NODE BIOPSY;  Surgeon: Stark Klein, MD;  Location: [facility];  Service: General;  Laterality: Right;   REDUCTION MAMMAPLASTY Bilateral 08/2020   TUBAL LIGATION      SOCIAL HISTORY: Social History   Socioeconomic History   Marital status: Married    Spouse name: Not on file   Number of children: 3   Years of education: Not on file   Highest education level: Not on file  Occupational History    Employer: [employer]  Tobacco Use   Smoking status: Never   Smokeless tobacco: Never  Vaping Use   Vaping Use: Never used  Substance and Sexual Activity   Alcohol use: No   Drug use: No   Sexual activity: Yes    Partners: Male    Birth control/protection: Surgical    Comment: Hyst.  Other Topics Concern   Not on file  Social History Narrative   Regular walking for exercise.   Social Determinants of Health   Financial Resource Strain: Low Risk  (  03/26/2020)   Overall Financial Resource Strain (CARDIA)    Difficulty of Paying Living Expenses: Not hard at all  Food Insecurity: No Food Insecurity (03/26/2020)   Hunger Vital Sign    Worried About Running Out of Food in the Last Year: Never true    Ran Out of Food in the Last Year: Never true  Transportation Needs: No Transportation Needs (03/26/2020)   PRAPARE - Hydrologist (Medical): No    Lack of Transportation (Non-Medical): No  Physical Activity: Insufficiently Active (03/26/2020)   Exercise Vital Sign    Days of  Exercise per Week: 3 days    Minutes of Exercise per Session: 20 min  Stress: No Stress Concern Present (03/26/2020)   [place]    Feeling of Stress : Not at all  Social Connections: Not on file  Intimate Partner Violence: Not At Risk (03/26/2020)   Humiliation, Afraid, Rape, and Kick questionnaire    Fear of Current or Ex-Partner: No    Emotionally Abused: No    Physically Abused: No    Sexually Abused: No    FAMILY HISTORY: Family History  Problem Relation Age of Onset   Heart disease Mother        MI   Hypertension Mother    Alcohol abuse Father    Heart disease Father        smoker   Hypertension Father    Breast cancer Paternal Grandmother    Diabetes Maternal Aunt     ALLERGIES:  is allergic to crestor [rosuvastatin calcium], lipitor [atorvastatin calcium], penicillins, and zocor [simvastatin - high dose].  MEDICATIONS:  Current Outpatient Medications  Medication Sig Dispense Refill   acetaminophen (TYLENOL) 500 MG tablet Take 1,000 mg by mouth every 6 (six) hours as needed for moderate pain.     ALPRAZolam (XANAX) 0.5 MG tablet 1 pill by mouth twice daily as needed for anxiety before procedure 6 tablet 0   Biotin 10 MG TABS Take 10 mg by mouth in the morning and at bedtime.     CALCIUM PO Take 1 tablet by mouth daily.     chlorthalidone (HYGROTON) 25 MG tablet Take 1 tablet (25 mg total) by mouth daily. 90 tablet 3   Cholecalciferol (D3 VITAMIN PO) Take 4,000 Units by mouth daily.     diclofenac Sodium (VOLTAREN) 1 % GEL Apply 2 g topically 4 (four) times daily as needed (pain).     hydrochlorothiazide (HYDRODIURIL) 25 MG tablet Take 25 mg by mouth daily.     iron polysaccharides (FERREX 150) 150 MG capsule Take 1 capsule by mouth once daily 30 capsule 3   ketoconazole (NIZORAL) 2 % cream APPLY TOPICALLY DAILY (Patient taking differently: 1 Application daily as needed (irritation under breast).) 15 g 0    levothyroxine (SYNTHROID) 75 MCG tablet Take 1 tablet by mouth once daily 90 tablet 2   loratadine (CLARITIN) 10 MG tablet Take 10 mg by mouth daily as needed for allergies.     Multiple Vitamin (MULTIVITAMIN) capsule Take 1 capsule by mouth daily.     naproxen sodium (ALEVE) 220 MG tablet Take 220 mg by mouth daily as needed (pain).     omeprazole (PRILOSEC) 20 MG capsule Take 1 capsule (20 mg total) by mouth daily. 90 capsule 3   OVER THE COUNTER MEDICATION Take 1 capsule by mouth daily. InstaFlex     No current facility-administered medications for this visit.  REVIEW OF SYSTEMS:   Constitutional: ( - ) fevers, ( - )  chills , ( - ) night sweats Eyes: ( - ) blurriness of vision, ( - ) double vision, ( - ) watery eyes Ears, nose, mouth, throat, and face: ( - ) mucositis, ( - ) sore throat Respiratory: ( - ) cough, (- ) dyspnea, ( - ) wheezes Cardiovascular: ( - ) palpitation, ( - ) chest discomfort, ( - ) lower extremity swelling Gastrointestinal:  ( - ) nausea, ( - ) heartburn, ( - ) change in bowel habits Skin: ( - ) abnormal skin rashes Lymphatics: ( - ) new lymphadenopathy, ( - ) easy bruising Neurological: ( - ) numbness, ( - ) tingling, ( - ) new weaknesses Behavioral/Psych: ( - ) mood change, ( - ) new changes  All other systems were reviewed with the patient and are negative.  PHYSICAL EXAMINATION: ECOG PERFORMANCE STATUS: 1 - Symptomatic but completely ambulatory  Vitals:   05/17/22 0957  BP: (!) 190/71  Pulse: 81  Resp: 16  Temp: 97.8 F (36.6 C)  SpO2: 97%   Filed Weights   05/17/22 0957  Weight: 227 lb 8 oz (103.2 kg)    GENERAL: well appearing female in NAD  SKIN: skin color, texture, turgor are normal, no rashes or significant lesions EYES: conjunctiva are pink and non-injected, sclera clear OROPHARYNX: no exudate, no erythema; lips, buccal mucosa, and tongue normal  NECK: supple, non-tender LYMPH:  no palpable lymphadenopathy in the cervical,  supraclavicular lymph nodes.  LUNGS: clear to auscultation and percussion with normal breathing effort HEART: regular rate & rhythm and no murmurs and no lower extremity edema ABDOMEN: soft, non-tender, non-distended, normal bowel sounds Musculoskeletal: no cyanosis of digits and no clubbing  PSYCH: alert & oriented x 3, fluent speech NEURO: no focal motor/sensory deficits BREAST: Palpable mass in the right outer lower quadrant.  LABORATORY DATA:  I have reviewed the data as listed    Latest Ref Rng & Units 04/12/2022    9:07 AM 03/02/2022    2:17 PM 01/10/2022   12:10 PM  CBC  WBC 4.0 - 10.5 K/uL 8.3  8.4  9.0   Hemoglobin 12.0 - 15.0 g/dL 13.1  12.3  9.4   Hematocrit 36.0 - 46.0 % 39.7  38.5  30.9   Platelets 150 - 400 K/uL 277  334  396        Latest Ref Rng & Units 04/12/2022    9:07 AM 03/29/2022    9:19 AM 03/06/2022   12:58 PM  CMP  Glucose 70 - 99 mg/dL 124  106  93   BUN 8 - 23 mg/dL '23  21  22   '$ Creatinine 0.44 - 1.00 mg/dL 0.88  0.92  0.95   Sodium 135 - 145 mmol/L 138  136  139   Potassium 3.5 - 5.1 mmol/L 3.6  3.5  4.6   Chloride 98 - 111 mmol/L 98  96  98   CO2 22 - 32 mmol/L 33  30  28   Calcium 8.9 - 10.3 mg/dL 9.7  9.4  9.7   Total Protein 6.5 - 8.1 g/dL 7.2   7.1   Total Bilirubin 0.3 - 1.2 mg/dL 0.3   0.3   Alkaline Phos 38 - 126 U/L 85     AST 15 - 41 U/L 20   17   ALT 0 - 44 U/L 17   15     ASSESSMENT & PLAN  Jennifer Bartlett is a 76 y.o. female returns for follow-up.  Today we discussed the diagnosis of CLL/SLL.  We discussed this is a more indolent lymphoma and that treatment is not always immediately required.  We discussed the criteria for treatment including drop in hemoglobin, drop in platelets, compressive lymphadenopathy, or symptomatic splenomegaly.  We discussed the prognostic panels and their meaning in terms of treatment options as well as prognosis.  We discussed that this is a condition that may never require treatment, but if it does there  are numerous options including infusions and oral therapy.  The patient voiced understanding of the disease process and the plan to observe moving forward.  She was in agreement with this plan.  #Iron deficiency anemia: --Secondary to malasorbption due to bariatric surgery --Placed referral to GI but consultation never took place. Consider re-referral if iron deficiency reoccurs.  --Currently on iron pills, okay to discontinue as patient is unlikely to absorb.  --Received IV feraheme 510 mg x 2 doses on 01/18/2022 and 01/25/2022.  --Labs show anemia has resolved with Hgb 13.1. Iron levels show no evidence of deficiency.  --No need for additional IV iron infusions at this time.  --Recheck iron levels in 3 months.   #H/O bariatric surgery: --B12 levels normal at last check.   --Recheck periodically, every 6 months.   #H/O right breast cancer: --Medical oncologist was Dr. Jana Hakim. Completed treatment in October 2016 and completed 5 years of Tamoxifen therapy in January 2022.  --Currently on surveillance with PCP.  --Last mammogram was 06/2021 which was negative for malignancy  #Left breast mass and left axillary lymphadenopathy: # CLL Rai Stage II  --CT chest from 04/07/2022 showed numerous enlarged and nonenlarged left axillary lymph nodes measuring up to 16 mm short axis --Physical exam findings were concerning for palpable mass in the outer lower quadrant of the left breast.  -- left axillary [place] with biopsy confirmed CLL/SLL --discussed the findings and plan moving forward today.   No orders of the defined types were placed in this encounter.   All questions were answered. The patient knows to call the clinic with any problems, questions or concerns.  I have spent a total of 30 minutes minutes of face-to-face and non-face-to-face time, preparing to see the patient, performing a medically appropriate examination, counseling and educating the patient, ordering  medications/tests/procedures, documenting clinical information in the electronic health record, independently interpreting results and communicating results to the patient, and care coordination.   Ledell Peoples, MD Department of Hematology/Oncology [place] [facility] Phone: [phone] Pager: [phone] Email: [email or web address]

## 2022-05-19 ENCOUNTER — Ambulatory Visit: Payer: Medicare Other

## 2022-05-21 ENCOUNTER — Encounter: Payer: Self-pay | Admitting: Physician Assistant

## 2022-05-30 ENCOUNTER — Telehealth: Payer: Self-pay | Admitting: Family Medicine

## 2022-05-30 ENCOUNTER — Encounter: Payer: Self-pay | Admitting: Physician Assistant

## 2022-05-30 NOTE — Telephone Encounter (Signed)
Left message for patient to call back and schedule Medicare Annual Wellness Visit (AWV) either virtually or phone' Left  my Jennifer Bartlett number [phone]   Last AWV 06/01/21    45 min for awv-i and in office appointments 30 min for awv-s  phone/virtual appointments

## 2022-06-06 ENCOUNTER — Ambulatory Visit (INDEPENDENT_AMBULATORY_CARE_PROVIDER_SITE_OTHER): Payer: Medicare Other

## 2022-06-06 VITALS — Ht 64.0 in | Wt 227.0 lb

## 2022-06-06 DIAGNOSIS — Z0000 Encounter for general adult medical examination without abnormal findings: Secondary | ICD-10-CM

## 2022-06-06 NOTE — Progress Notes (Signed)
Subjective:   Jennifer Bartlett is a 76 y.o. female who presents for Medicare Annual (Subsequent) preventive examination.  Review of Systems    Virtual Visit via Telephone Note  I connected with  Jennifer Bartlett on 06/06/22 at  8:45 AM EST by telephone and verified that I am speaking with the correct person using two identifiers.  Location: Patient: Home Provider: Office Persons participating in the virtual visit: patient/Nurse Health Advisor   I discussed the limitations, risks, security and privacy concerns of performing an evaluation and management service by telephone and the availability of in person appointments. The patient expressed understanding and agreed to proceed.  Interactive audio and video telecommunications were attempted between this nurse and patient, however failed, due to patient having technical difficulties OR patient did not have access to video capability.  We continued and completed visit with audio only.  Some vital signs may be absent or patient reported.   Criselda Peaches, LPN  Cardiac Risk Factors include: advanced age (>83mn, >>21women);hypertension     Objective:    Today's Vitals   06/06/22 0851  Weight: 227 lb (103 kg)  Height: '5\' 4"'$  (1.626 m)   Body mass index is 38.96 kg/m.     06/06/2022    8:59 AM 03/02/2022    1:56 PM 09/14/2020    6:26 AM 09/07/2020   11:05 AM 03/26/2020   11:26 AM 06/30/2016    8:48 AM 05/03/2016   11:31 AM  Advanced Directives  Does Patient Have a Medical Advance Directive? No No No No No Yes No  Type of ATeacher, early years/preLiving will   [place] of [place]?      No - copy requested   Would patient like information on creating a medical advance directive? No - Patient declined Yes (MAU/Ambulatory/Procedural Areas - Information given) No - Patient declined No - Patient declined Yes (MAU/Ambulatory/Procedural Areas - Information given)      Current  Medications (verified) Outpatient Encounter Medications as of 06/06/2022  Medication Sig   acetaminophen (TYLENOL) 500 MG tablet Take 1,000 mg by mouth every 6 (six) hours as needed for moderate pain.   ALPRAZolam (XANAX) 0.5 MG tablet 1 pill by mouth twice daily as needed for anxiety before procedure   Biotin 10 MG TABS Take 10 mg by mouth in the morning and at bedtime.   CALCIUM PO Take 1 tablet by mouth daily.   chlorthalidone (HYGROTON) 25 MG tablet Take 1 tablet (25 mg total) by mouth daily.   Cholecalciferol (D3 VITAMIN PO) Take 4,000 Units by mouth daily.   diclofenac Sodium (VOLTAREN) 1 % GEL Apply 2 g topically 4 (four) times daily as needed (pain).   hydrochlorothiazide (HYDRODIURIL) 25 MG tablet Take 25 mg by mouth daily.   iron polysaccharides (FERREX 150) 150 MG capsule Take 1 capsule by mouth once daily   ketoconazole (NIZORAL) 2 % cream APPLY TOPICALLY DAILY (Patient taking differently: 1 Application daily as needed (irritation under breast).)   levothyroxine (SYNTHROID) 75 MCG tablet Take 1 tablet by mouth once daily   loratadine (CLARITIN) 10 MG tablet Take 10 mg by mouth daily as needed for allergies.   Multiple Vitamin (MULTIVITAMIN) capsule Take 1 capsule by mouth daily.   naproxen sodium (ALEVE) 220 MG tablet Take 220 mg by mouth daily as needed (pain).   omeprazole (PRILOSEC) 20 MG capsule Take 1 capsule (20 mg total) by mouth daily.  OVER THE COUNTER MEDICATION Take 1 capsule by mouth daily. InstaFlex   No facility-administered encounter medications on file as of 06/06/2022.    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   Depression    Family history of adverse reaction to  anesthesia    mother has had PONV   GERD (gastroesophageal reflux disease)    Hot flashes    Hyperlipidemia    Hypertension    Hypoglycemia    Hypothyroidism    Obesity    Personal history of radiation therapy    Vitamin B 12 deficiency    Past Surgical History:  Procedure Laterality Date   ABDOMINAL HYSTERECTOMY     BREAST BIOPSY     BREAST LUMPECTOMY Right    2016   BREAST REDUCTION SURGERY Bilateral 09/14/2020   Procedure: MAMMARY REDUCTION  (BREAST);  Surgeon: Irene Limbo, MD;  Location: [facility];  Service: Plastics;  Laterality: Bilateral;   GASTRIC BYPASS  08/2003   KNEE SURGERY     left   PARTIAL HYSTERECTOMY  1990's   bleeding   RADIOACTIVE SEED GUIDED PARTIAL MASTECTOMY WITH AXILLARY SENTINEL LYMPH NODE BIOPSY Right 03/09/2015   Procedure: RADIOACTIVE SEED GUIDED RIGHT PARTIAL MASTECTOMY WITH AXILLARY SENTINEL LYMPH NODE BIOPSY;  Surgeon: Stark Klein, MD;  Location: [facility];  Service: General;  Laterality: Right;   REDUCTION MAMMAPLASTY Bilateral 08/2020   TUBAL LIGATION     Family History  Problem Relation Age of Onset   Heart disease Mother        MI   Hypertension Mother    Alcohol abuse Father    Heart disease Father        smoker   Hypertension Father    Breast cancer Paternal Grandmother    Diabetes Maternal Aunt    Social History   Socioeconomic History   Marital status: Married    Spouse name: Not on file   Number of children: 3   Years of education: Not on file   Highest education level: Not on file  Occupational History    Employer: [employer]  Tobacco Use   Smoking status: Never   Smokeless tobacco: Never  Vaping Use   Vaping Use: Never used  Substance and Sexual Activity   Alcohol use: No   Drug use: No   Sexual activity: Yes    Partners: Male    Birth control/protection: Surgical    Comment: Hyst.  Other Topics Concern   Not on file  Social History Narrative   Regular walking  for exercise.   Social Determinants of Health   Financial Resource Strain: Low Risk  (06/06/2022)   Overall Financial Resource Strain (CARDIA)    Difficulty of Paying Living Expenses: Not hard at all  Food Insecurity: No Food Insecurity (06/06/2022)   Hunger Vital Sign    Worried About Running Out of Food in the Last Year: Never true    Ran Out of Food in the Last Year: Never true  Transportation Needs: No Transportation Needs (06/06/2022)   PRAPARE - Hydrologist (Medical): No    Lack of Transportation (Non-Medical): No  Physical Activity: Sufficiently Active (06/06/2022)  Exercise Vital Sign    Days of Exercise per Week: 3 days    Minutes of Exercise per Session: 60 min  Stress: No Stress Concern Present (06/06/2022)   [place]    Feeling of Stress : Not at all  Social Connections: [place] (06/06/2022)   Social Connection and Isolation Panel [NHANES]    Frequency of Communication with Friends and Family: More than three times a week    Frequency of Social Gatherings with Friends and Family: More than three times a week    Attends Religious Services: More than 4 times per year    Active Member of [employer] or Organizations: Yes    Attends Music therapist: More than 4 times per year    Marital Status: Married    Tobacco Counseling Counseling given: Not Answered   Clinical Intake:  Pre-visit preparation completed: No  Pain : No/denies pain     BMI - recorded: 38.96 Nutritional Status: BMI > 30  Obese Diabetes: No  How often do you need to have someone help you when you read instructions, pamphlets, or other written materials from your doctor or pharmacy?: 1 - Never  Diabetic?  None  Interpreter Needed?: No  Information entered by :: Rolene Arbour LPN   Activities of Daily Living    06/06/2022    8:57 AM 03/02/2022    2:06 PM  In your present state  of health, do you have any difficulty performing the following activities:  Hearing? 0   Vision? 0   Difficulty concentrating or making decisions? 0   Walking or climbing stairs? 0   Dressing or bathing? 0   Doing errands, shopping? 0 0  Preparing Food and eating ? N   Using the Toilet? N   In the past six months, have you accidently leaked urine? N   Do you have problems with loss of bowel control? N   Managing your Medications? N   Managing your Finances? N   Housekeeping or managing your Housekeeping? N     Patient Care Team: Tower, Wynelle Fanny, MD as PCP - General Young, Candiss Norse, NP (Inactive) as Nurse Practitioner (Obstetrics and Gynecology) Stark Klein, MD as Consulting Physician (General Surgery) Mauro Kaufmann, RN as Registered Nurse Rockwell Germany, RN as Registered Nurse Jake Shark Johny Blamer, NP as Nurse Practitioner (Nurse Practitioner) Vickey Huger, MD as Consulting Physician (Orthopedic Surgery)  Indicate any recent Medical Services you may have received from other than [facility] providers in the past year (date may be approximate).     Assessment:   This is a routine wellness examination for Jennifer Bartlett.  Hearing/Vision screen Hearing Screening - Comments:: Denies hearing difficulties   Vision Screening - Comments:: Wears reading glasses - up to date with routine eye exams with  Dr Glennon Mac  Dietary issues and exercise activities discussed: Exercise limited by: None identified   Goals Addressed               This Visit's Progress     Patient Stated (pt-stated)        I want to stay alive and continue to walk 2-3 times a week for about 20 minutes.        Depression Screen    06/06/2022    8:55 AM 06/01/2021    9:40 AM 05/11/2020    9:24 AM 03/26/2020   11:29 AM 12/05/2019   12:32 PM 07/16/2017    9:17 AM 06/30/2016  8:46 AM  PHQ 2/9 Scores  PHQ - 2 Score 0 0 0 0 0 0 0  PHQ- 9 Score   0 0 2      Fall Risk    06/06/2022    8:58 AM 06/01/2021    9:39  AM 05/11/2020    9:24 AM 03/26/2020   11:28 AM 07/16/2017    9:17 AM  [place] in the past year? 0 0 0 0 No  Number falls in past yr: 0  0 0   Injury with Fall? 0  0 0   Risk for fall due to : No Fall Risks   No Fall Risks   Follow up Falls prevention discussed Falls evaluation completed  Falls evaluation completed;Falls prevention discussed     FALL RISK PREVENTION PERTAINING TO THE HOME:  Any stairs in or around the home? Yes  If so, are there any without handrails? No  Home free of loose throw rugs in walkways, pet beds, electrical cords, etc? Yes  Adequate lighting in your home to reduce risk of falls? Yes   ASSISTIVE DEVICES UTILIZED TO PREVENT FALLS:  Life alert? No  Use of a cane, walker or w/c? No  Grab bars in the bathroom? Yes  Shower chair or bench in shower? Yes  Elevated toilet seat or a handicapped toilet? Yes   TIMED UP AND GO:  Was the test performed? No . Audio Visit    Cognitive Function:    03/26/2020   11:31 AM 06/30/2016    9:05 AM  MMSE - Mini Mental State Exam  Orientation to time 5 5  Orientation to Place 5 5  Registration 3 3  Attention/ Calculation 5 0  Recall 3 1  Recall-comments  pt was unable to recall 2 of 3 words  Language- name 2 objects  0  Language- repeat 1 1  Language- follow 3 step command  3  Language- read & follow direction  0  Write a sentence  0  Copy design  0  Total score  18        06/06/2022    8:59 AM  6CIT Screen  What Year? 0 points  What month? 0 points  What time? 0 points  Count back from 20 0 points  Months in reverse 0 points  Repeat phrase 0 points  Total Score 0 points    Immunizations Immunization History  Administered Date(s) Administered   Fluad Quad(high Dose 65+) 05/15/2019, 05/11/2020, 05/20/2021   Influenza Split 04/25/2011   Influenza Whole 08/01/2003, 04/20/2010   Influenza,inj,Quad PF,6+ Mos 05/15/2013, 04/20/2014, 06/30/2015, 07/06/2017   Influenza-Unspecified 05/14/2016    PFIZER(Purple Top)SARS-COV-2 Vaccination 09/22/2019, 10/13/2019   Pneumococcal Conjugate-13 08/18/2014   Pneumococcal Polysaccharide-23 09/06/2012   Td 11/17/2004   Tdap 07/04/2016   Zoster, Live 06/21/2010    TDAP status: Up to date  Flu Vaccine status: Up to date  Pneumococcal vaccine status: Up to date  Covid-19 vaccine status: Completed vaccines  Qualifies for Shingles Vaccine? Yes   Zostavax completed No   Shingrix Completed?: No.    Education has been provided regarding the importance of this vaccine. Patient has been advised to call insurance company to determine out of pocket expense if they have not yet received this vaccine. Advised may also receive vaccine at local pharmacy or Health Dept. Verbalized acceptance and understanding.  Screening Tests Health Maintenance  Topic Date Due   COVID-19 Vaccine (3 - Pfizer risk series) 06/22/2022 (Originally 11/10/2019)  Zoster Vaccines- Shingrix (1 of 2) 09/06/2022 (Originally 04/13/1965)   INFLUENZA VACCINE  10/29/2022 (Originally 02/28/2022)   Fecal DNA (Cologuard)  01/05/2023   Medicare Annual Wellness (AWV)  06/07/2023   TETANUS/TDAP  07/04/2026   Pneumonia Vaccine 55+ Years old  Completed   DEXA SCAN  Completed   Hepatitis C Screening  Completed   HPV VACCINES  Aged Out    Health Maintenance  There are no preventive care reminders to display for this patient.   Colorectal cancer screening: No longer required.   Mammogram status: No longer required due to Age.  Bone Density status: Completed 05/12/21. Results reflect: Bone density results: OSTEOPOROSIS. Repeat every   years.  Lung Cancer Screening: (Low Dose CT Chest recommended if Age 28-80 years, 30 pack-year currently smoking OR have quit w/in 15years.) does not qualify.     Additional Screening:  Hepatitis C Screening: does qualify; Completed 06/30/16  Vision Screening: Recommended annual ophthalmology exams for early detection of glaucoma and other  disorders of the eye. Is the patient up to date with their annual eye exam?  Yes  Who is the provider or what is the name of the office in which the patient attends annual eye exams? Dr Glennon Mac If pt is not established with a provider, would they like to be referred to a provider to establish care? No .   Dental Screening: Recommended annual dental exams for proper oral hygiene  Community Resource Referral / Chronic Care Management:  CRR required this visit?  No   CCM required this visit?  No      Plan:     I have personally reviewed and noted the following in the patient's chart:   Medical and social history Use of alcohol, tobacco or illicit drugs  Current medications and supplements including opioid prescriptions. Patient is not currently taking opioid prescriptions. Functional ability and status Nutritional status Physical activity Advanced directives List of other physicians Hospitalizations, surgeries, and ER visits in previous 12 months Vitals Screenings to include cognitive, depression, and falls Referrals and appointments  In addition, I have reviewed and discussed with patient certain preventive protocols, quality metrics, and best practice recommendations. A written personalized care plan for preventive services as well as general preventive health recommendations were provided to patient.     Criselda Peaches, LPN   [id number]   Nurse Notes: None

## 2022-06-06 NOTE — Patient Instructions (Addendum)
[name] , Thank you for taking time to come for your Medicare Wellness Visit. I appreciate your ongoing commitment to your health goals. Please review the following plan we discussed and let me know if I can assist you in the future.   These are the goals we discussed:  Goals       Increase physical activity      Starting 06/30/2016, I will continue to walk at least 20 min 2-3 days per week.       Patient Stated (pt-stated)      I want to stay alive and continue to walk 2-3 times a week for about 20 minutes.         This is a list of the screening recommended for you and due dates:  Health Maintenance  Topic Date Due   COVID-19 Vaccine (3 - Pfizer risk series) 06/22/2022*   Zoster (Shingles) Vaccine (1 of 2) 09/06/2022*   Flu Shot  10/29/2022*   Cologuard (Stool DNA test)  01/05/2023   Medicare Annual Wellness Visit  06/07/2023   Tetanus Vaccine  07/04/2026   Pneumonia Vaccine  Completed   DEXA scan (bone density measurement)  Completed   Hepatitis C Screening: USPSTF Recommendation to screen - Ages 68-79 yo.  Completed   HPV Vaccine  Aged Out  *Topic was postponed. The date shown is not the original due date.    Advanced directives: Advance directive discussed with you today. Even though you declined this today, please call our office should you change your mind, and we can give you the proper paperwork for you to fill out.   Conditions/risks identified: None  Next appointment: Follow up in one year for your annual wellness visit     Preventive Care 65 Years and Older, Female Preventive care refers to lifestyle choices and visits with your health care provider that can promote health and wellness. What does preventive care include? A yearly physical exam. This is also called an annual well check. Dental exams once or twice a year. Routine eye exams. Ask your health care provider how often you should have your eyes checked. Personal lifestyle choices,  including: Daily care of your teeth and gums. Regular physical activity. Eating a healthy diet. Avoiding tobacco and drug use. Limiting alcohol use. Practicing safe sex. Taking low-dose aspirin every day. Taking vitamin and mineral supplements as recommended by your health care provider. What happens during an annual well check? The services and screenings done by your health care provider during your annual well check will depend on your age, overall health, lifestyle risk factors, and family history of disease. Counseling  Your health care provider may ask you questions about your: Alcohol use. Tobacco use. Drug use. Emotional well-being. Home and relationship well-being. Sexual activity. Eating habits. History of falls. Memory and ability to understand (cognition). Work and work Statistician. Reproductive health. Screening  You may have the following tests or measurements: Height, weight, and BMI. Blood pressure. Lipid and cholesterol levels. These may be checked every 5 years, or more frequently if you are over 56 years old. Skin check. Lung cancer screening. You may have this screening every year starting at age 64 if you have a 30-pack-year history of smoking and currently smoke or have quit within the past 15 years. Fecal occult blood test (FOBT) of the stool. You may have this test every year starting at age 5. Flexible sigmoidoscopy or colonoscopy. You may have a sigmoidoscopy every 5 years or a colonoscopy every 10  years starting at age 38. Hepatitis C blood test. Hepatitis B blood test. Sexually transmitted disease (STD) testing. Diabetes screening. This is done by checking your blood sugar (glucose) after you have not eaten for a while (fasting). You may have this done every 1-3 years. Bone density scan. This is done to screen for osteoporosis. You may have this done starting at age 35. Mammogram. This may be done every 1-2 years. Talk to your health care provider  about how often you should have regular mammograms. Talk with your health care provider about your test results, treatment options, and if necessary, the need for more tests. Vaccines  Your health care provider may recommend certain vaccines, such as: Influenza vaccine. This is recommended every year. Tetanus, diphtheria, and acellular pertussis (Tdap, Td) vaccine. You may need a Td booster every 10 years. Zoster vaccine. You may need this after age 73. Pneumococcal 13-valent conjugate (PCV13) vaccine. One dose is recommended after age 46. Pneumococcal polysaccharide (PPSV23) vaccine. One dose is recommended after age 42. Talk to your health care provider about which screenings and vaccines you need and how often you need them. This information is not intended to replace advice given to you by your health care provider. Make sure you discuss any questions you have with your health care provider. Document Released: 08/13/2015 Document Revised: 04/05/2016 Document Reviewed: 05/18/2015 Elsevier Interactive Patient Education  2017 [place] Prevention in the Home Falls can cause injuries. They can happen to people of all ages. There are many things you can do to make your home safe and to help prevent falls. What can I do on the outside of my home? Regularly fix the edges of walkways and driveways and fix any cracks. Remove anything that might make you trip as you walk through a door, such as a raised step or threshold. Trim any bushes or trees on the path to your home. Use bright outdoor lighting. Clear any walking paths of anything that might make someone trip, such as rocks or tools. Regularly check to see if handrails are loose or broken. Make sure that both sides of any steps have handrails. Any raised decks and porches should have guardrails on the edges. Have any leaves, snow, or ice cleared regularly. Use sand or salt on walking paths during winter. Clean up any spills in  your garage right away. This includes oil or grease spills. What can I do in the bathroom? Use night lights. Install grab bars by the toilet and in the tub and shower. Do not use towel bars as grab bars. Use non-skid mats or decals in the tub or shower. If you need to sit down in the shower, use a plastic, non-slip stool. Keep the floor dry. Clean up any water that spills on the floor as soon as it happens. Remove soap buildup in the tub or shower regularly. Attach bath mats securely with double-sided non-slip rug tape. Do not have throw rugs and other things on the floor that can make you trip. What can I do in the bedroom? Use night lights. Make sure that you have a light by your bed that is easy to reach. Do not use any sheets or blankets that are too big for your bed. They should not hang down onto the floor. Have a firm chair that has side arms. You can use this for support while you get dressed. Do not have throw rugs and other things on the floor that can make you trip.  What can I do in the kitchen? Clean up any spills right away. Avoid walking on wet floors. Keep items that you use a lot in easy-to-reach places. If you need to reach something above you, use a strong step stool that has a grab bar. Keep electrical cords out of the way. Do not use floor polish or wax that makes floors slippery. If you must use wax, use non-skid floor wax. Do not have throw rugs and other things on the floor that can make you trip. What can I do with my stairs? Do not leave any items on the stairs. Make sure that there are handrails on both sides of the stairs and use them. Fix handrails that are broken or loose. Make sure that handrails are as long as the stairways. Check any carpeting to make sure that it is firmly attached to the stairs. Fix any carpet that is loose or worn. Avoid having throw rugs at the top or bottom of the stairs. If you do have throw rugs, attach them to the floor with carpet  tape. Make sure that you have a light switch at the top of the stairs and the bottom of the stairs. If you do not have them, ask someone to add them for you. What else can I do to help prevent falls? Wear shoes that: Do not have high heels. Have rubber bottoms. Are comfortable and fit you well. Are closed at the toe. Do not wear sandals. If you use a stepladder: Make sure that it is fully opened. Do not climb a closed stepladder. Make sure that both sides of the stepladder are locked into place. Ask someone to hold it for you, if possible. Clearly mark and make sure that you can see: Any grab bars or handrails. First and last steps. Where the edge of each step is. Use tools that help you move around (mobility aids) if they are needed. These include: Canes. Walkers. Scooters. Crutches. Turn on the lights when you go into a dark area. Replace any light bulbs as soon as they burn out. Set up your furniture so you have a clear path. Avoid moving your furniture around. If any of your floors are uneven, fix them. If there are any pets around you, be aware of where they are. Review your medicines with your doctor. Some medicines can make you feel dizzy. This can increase your chance of falling. Ask your doctor what other things that you can do to help prevent falls. This information is not intended to replace advice given to you by your health care provider. Make sure you discuss any questions you have with your health care provider. Document Released: 05/13/2009 Document Revised: 12/23/2015 Document Reviewed: 08/21/2014 Elsevier Interactive Patient Education  2017 [employer].

## 2022-07-28 ENCOUNTER — Ambulatory Visit
Admission: RE | Admit: 2022-07-28 | Discharge: 2022-07-28 | Disposition: A | Discharge status: Home / Self Care | Payer: Medicare Other | Source: Ambulatory Visit | Attending: Family Medicine | Admitting: Family Medicine

## 2022-07-28 DIAGNOSIS — Z1231 Encounter for screening mammogram for malignant neoplasm of breast: Secondary | ICD-10-CM

## 2022-07-30 ENCOUNTER — Encounter: Payer: Self-pay | Admitting: Physician Assistant

## 2022-08-01 ENCOUNTER — Other Ambulatory Visit: Payer: Self-pay | Admitting: Family Medicine

## 2022-08-02 ENCOUNTER — Ambulatory Visit (INDEPENDENT_AMBULATORY_CARE_PROVIDER_SITE_OTHER): Payer: Medicare Other

## 2022-08-02 DIAGNOSIS — Z23 Encounter for immunization: Secondary | ICD-10-CM

## 2022-08-07 ENCOUNTER — Telehealth: Payer: Self-pay | Admitting: Physician Assistant

## 2022-08-07 NOTE — Telephone Encounter (Signed)
Called patient to r/s 1/19 appointment due to provider PAL. Patient r/s and notified.

## 2022-08-18 ENCOUNTER — Other Ambulatory Visit: Payer: Medicare Other

## 2022-08-18 ENCOUNTER — Ambulatory Visit: Payer: Medicare Other | Admitting: Physician Assistant

## 2022-08-25 ENCOUNTER — Other Ambulatory Visit: Payer: Self-pay | Admitting: Family Medicine

## 2022-08-31 ENCOUNTER — Other Ambulatory Visit: Payer: Self-pay | Admitting: Physician Assistant

## 2022-08-31 DIAGNOSIS — D509 Iron deficiency anemia, unspecified: Secondary | ICD-10-CM

## 2022-08-31 DIAGNOSIS — C8300 Small cell B-cell lymphoma, unspecified site: Secondary | ICD-10-CM

## 2022-09-01 ENCOUNTER — Inpatient Hospital Stay: Payer: Medicare Other | Attending: Nurse Practitioner

## 2022-09-01 ENCOUNTER — Encounter: Payer: Self-pay | Admitting: Physician Assistant

## 2022-09-01 ENCOUNTER — Other Ambulatory Visit: Payer: Self-pay

## 2022-09-01 ENCOUNTER — Inpatient Hospital Stay: Payer: Medicare Other | Admitting: Physician Assistant

## 2022-09-01 VITALS — BP 126/82 | HR 75 | Temp 97.6°F | Resp 16 | Wt 224.7 lb

## 2022-09-01 DIAGNOSIS — D509 Iron deficiency anemia, unspecified: Secondary | ICD-10-CM | POA: Diagnosis not present

## 2022-09-01 DIAGNOSIS — C9110 Chronic lymphocytic leukemia of B-cell type not having achieved remission: Secondary | ICD-10-CM | POA: Insufficient documentation

## 2022-09-01 DIAGNOSIS — E876 Hypokalemia: Secondary | ICD-10-CM | POA: Insufficient documentation

## 2022-09-01 DIAGNOSIS — Z08 Encounter for follow-up examination after completed treatment for malignant neoplasm: Secondary | ICD-10-CM | POA: Insufficient documentation

## 2022-09-01 DIAGNOSIS — Z853 Personal history of malignant neoplasm of breast: Secondary | ICD-10-CM | POA: Diagnosis not present

## 2022-09-01 DIAGNOSIS — C8300 Small cell B-cell lymphoma, unspecified site: Secondary | ICD-10-CM | POA: Diagnosis not present

## 2022-09-01 LAB — CBC WITH DIFFERENTIAL (CANCER CENTER ONLY)
Abs Immature Granulocytes: 0.02 10*3/uL (ref 0.00–0.07)
Basophils Absolute: 0.1 10*3/uL (ref 0.0–0.1)
Basophils Relative: 1 %
Eosinophils Absolute: 0.2 10*3/uL (ref 0.0–0.5)
Eosinophils Relative: 3 %
HCT: 41.9 % (ref 36.0–46.0)
Hemoglobin: 13.9 g/dL (ref 12.0–15.0)
Immature Granulocytes: 0 %
Lymphocytes Relative: 23 %
Lymphs Abs: 1.4 10*3/uL (ref 0.7–4.0)
MCH: 31 pg (ref 26.0–34.0)
MCHC: 33.2 g/dL (ref 30.0–36.0)
MCV: 93.3 fL (ref 80.0–100.0)
Monocytes Absolute: 0.5 10*3/uL (ref 0.1–1.0)
Monocytes Relative: 7 %
Neutro Abs: 4.1 10*3/uL (ref 1.7–7.7)
Neutrophils Relative %: 66 %
Platelet Count: 308 10*3/uL (ref 150–400)
RBC: 4.49 MIL/uL (ref 3.87–5.11)
RDW: 13.4 % (ref 11.5–15.5)
WBC Count: 6.2 10*3/uL (ref 4.0–10.5)
nRBC: 0 % (ref 0.0–0.2)

## 2022-09-01 LAB — CMP (CANCER CENTER ONLY)
ALT: 15 U/L (ref 0–44)
AST: 18 U/L (ref 15–41)
Albumin: 4 g/dL (ref 3.5–5.0)
Alkaline Phosphatase: 99 U/L (ref 38–126)
Anion gap: 8 (ref 5–15)
BUN: 20 mg/dL (ref 8–23)
CO2: 31 mmol/L (ref 22–32)
Calcium: 9.4 mg/dL (ref 8.9–10.3)
Chloride: 98 mmol/L (ref 98–111)
Creatinine: 0.92 mg/dL (ref 0.44–1.00)
GFR, Estimated: >60 mL/min (ref >60)
Glucose, Bld: 113 mg/dL — ABNORMAL HIGH (ref 70–99)
Potassium: 3.2 mmol/L — ABNORMAL LOW (ref 3.5–5.1)
Sodium: 137 mmol/L (ref 135–145)
Total Bilirubin: 0.5 mg/dL (ref 0.3–1.2)
Total Protein: 7.4 g/dL (ref 6.5–8.1)

## 2022-09-01 LAB — IRON AND IRON BINDING CAPACITY (CC-WL,HP ONLY)
Iron: 94 ug/dL (ref 28–170)
Saturation Ratios: 26 % (ref 10.4–31.8)
TIBC: 360 ug/dL (ref 250–450)
UIBC: 266 ug/dL (ref 148–442)

## 2022-09-01 LAB — FERRITIN: Ferritin: 69 ng/mL (ref 11–307)

## 2022-09-01 NOTE — Progress Notes (Unsigned)
[place] [phone]   [phone]  PROGRESS NOTE  Patient Care Team: Tower, Wynelle Fanny, MD as PCP - General Annamaria Boots Candiss Norse, NP (Inactive) as Nurse Practitioner (Obstetrics and Gynecology) Stark Klein, MD as Consulting Physician (General Surgery) Mauro Kaufmann, RN as Registered Nurse Rockwell Germany, RN as Registered Nurse Jake Shark, Johny Blamer, NP as Nurse Practitioner (Nurse Practitioner) Vickey Huger, MD as Consulting Physician (Orthopedic Surgery)  CHIEF COMPLAINTS/PURPOSE OF CONSULTATION:  -Iron deficiency anemia -History of right breast cancer -Left axillary lymphadenopathy--biopsy-proven CLL/SLL  HISTORY OF PRESENTING ILLNESS:  Jennifer Bartlett 77 y.o. female returns for follow-up to discuss the diagnosis of CLL/SLL and the plan moving forward.  On exam today, Ms. Klinck reports she has had no major changes in her health in the interim since her last visit.  She denies fevers, chills, sweats, shortness of breath, chest pain or cough. She has no other complaints. Rest of the 10 point ROS is below.   The bulk of our discussion focused on the diagnosis of CLL/SLL and the steps moving forward.  Details of this conversation are noted below.  MEDICAL HISTORY:  Past Medical History:  Diagnosis Date   Anemia    Anxiety    Arthritis    hands, knees   Breast cancer ([place])    Breast cancer of upper-inner quadrant of right female breast ([place]) 02/26/2015   Depression    Family history of adverse reaction to anesthesia    mother has had PONV   GERD (gastroesophageal reflux disease)    Hot flashes    Hyperlipidemia    Hypertension    Hypoglycemia    Hypothyroidism    Obesity    Personal history of radiation therapy    Vitamin B 12 deficiency     SURGICAL HISTORY: Past Surgical History:  Procedure Laterality Date   ABDOMINAL HYSTERECTOMY     BREAST BIOPSY     BREAST LUMPECTOMY Right    2016   BREAST REDUCTION SURGERY  Bilateral 09/14/2020   Procedure: MAMMARY REDUCTION  (BREAST);  Surgeon: Lamark Schue Limbo, MD;  Location: [facility];  Service: Plastics;  Laterality: Bilateral;   GASTRIC BYPASS  08/2003   KNEE SURGERY     left   PARTIAL HYSTERECTOMY  1990's   bleeding   RADIOACTIVE SEED GUIDED PARTIAL MASTECTOMY WITH AXILLARY SENTINEL LYMPH NODE BIOPSY Right 03/09/2015   Procedure: RADIOACTIVE SEED GUIDED RIGHT PARTIAL MASTECTOMY WITH AXILLARY SENTINEL LYMPH NODE BIOPSY;  Surgeon: Stark Klein, MD;  Location: [facility];  Service: General;  Laterality: Right;   REDUCTION MAMMAPLASTY Bilateral 08/2020   TUBAL LIGATION      SOCIAL HISTORY: Social History   Socioeconomic History   Marital status: Married    Spouse name: Not on file   Number of children: 3   Years of education: Not on file   Highest education level: Not on file  Occupational History    Employer: [employer]  Tobacco Use   Smoking status: Never   Smokeless tobacco: Never  Vaping Use   Vaping Use: Never used  Substance and Sexual Activity   Alcohol use: No   Drug use: No   Sexual activity: Yes    Partners: Male    Birth control/protection: Surgical    Comment: Hyst.  Other Topics Concern   Not on file  Social History Narrative   Regular walking for exercise.   Social Determinants of Health   Financial Resource Strain: Low Risk  (  06/06/2022)   Overall Financial Resource Strain (CARDIA)    Difficulty of Paying Living Expenses: Not hard at all  Food Insecurity: No Food Insecurity (06/06/2022)   Hunger Vital Sign    Worried About Running Out of Food in the Last Year: Never true    Ran Out of Food in the Last Year: Never true  Transportation Needs: No Transportation Needs (06/06/2022)   PRAPARE - Hydrologist (Medical): No    Lack of Transportation (Non-Medical): No  Physical Activity: Sufficiently Active (06/06/2022)   Exercise Vital Sign    Days of  Exercise per Week: 3 days    Minutes of Exercise per Session: 60 min  Stress: No Stress Concern Present (06/06/2022)   [place]    Feeling of Stress : Not at all  Social Connections: [place] (06/06/2022)   Social Connection and Isolation Panel [NHANES]    Frequency of Communication with Friends and Family: More than three times a week    Frequency of Social Gatherings with Friends and Family: More than three times a week    Attends Religious Services: More than 4 times per year    Active Member of [employer] or Organizations: Yes    Attends Music therapist: More than 4 times per year    Marital Status: Married  Human resources officer Violence: Not At Risk (06/06/2022)   Humiliation, Afraid, Rape, and Kick questionnaire    Fear of Current or Ex-Partner: No    Emotionally Abused: No    Physically Abused: No    Sexually Abused: No    FAMILY HISTORY: Family History  Problem Relation Age of Onset   Heart disease Mother        MI   Hypertension Mother    Alcohol abuse Father    Heart disease Father        smoker   Hypertension Father    Breast cancer Paternal Grandmother    Diabetes Maternal Aunt     ALLERGIES:  is allergic to crestor [rosuvastatin calcium], lipitor [atorvastatin calcium], penicillins, and zocor [simvastatin - high dose].  MEDICATIONS:  Current Outpatient Medications  Medication Sig Dispense Refill   acetaminophen (TYLENOL) 500 MG tablet Take 1,000 mg by mouth every 6 (six) hours as needed for moderate pain.     Biotin 10 MG TABS Take 10 mg by mouth in the morning and at bedtime.     CALCIUM PO Take 1 tablet by mouth daily.     chlorthalidone (HYGROTON) 25 MG tablet Take 1 tablet (25 mg total) by mouth daily. 90 tablet 3   Cholecalciferol (D3 VITAMIN PO) Take 4,000 Units by mouth daily.     diclofenac Sodium (VOLTAREN) 1 % GEL Apply 2 g topically 4 (four) times daily as needed  (pain).     hydrochlorothiazide (HYDRODIURIL) 25 MG tablet Take 25 mg by mouth daily.     iron polysaccharides (FERREX 150) 150 MG capsule Take 1 capsule by mouth once daily 30 capsule 3   ketoconazole (NIZORAL) 2 % cream APPLY TOPICALLY DAILY (Patient taking differently: 1 Application daily as needed (irritation under breast).) 15 g 0   levothyroxine (SYNTHROID) 75 MCG tablet Take 1 tablet by mouth once daily 90 tablet 2   loratadine (CLARITIN) 10 MG tablet Take 10 mg by mouth daily as needed for allergies.     Multiple Vitamin (MULTIVITAMIN) capsule Take 1 capsule by mouth daily.     naproxen  sodium (ALEVE) 220 MG tablet Take 220 mg by mouth daily as needed (pain).     omeprazole (PRILOSEC) 20 MG capsule Take 1 capsule by mouth once daily 90 capsule 1   OVER THE COUNTER MEDICATION Take 1 capsule by mouth daily. InstaFlex     ALPRAZolam (XANAX) 0.5 MG tablet 1 pill by mouth twice daily as needed for anxiety before procedure (Patient not taking: Reported on 09/01/2022) 6 tablet 0   No current facility-administered medications for this visit.    REVIEW OF SYSTEMS:   Constitutional: ( - ) fevers, ( - )  chills , ( - ) night sweats Eyes: ( - ) blurriness of vision, ( - ) double vision, ( - ) watery eyes Ears, nose, mouth, throat, and face: ( - ) mucositis, ( - ) sore throat Respiratory: ( - ) cough, (- ) dyspnea, ( - ) wheezes Cardiovascular: ( - ) palpitation, ( - ) chest discomfort, ( - ) lower extremity swelling Gastrointestinal:  ( - ) nausea, ( - ) heartburn, ( - ) change in bowel habits Skin: ( - ) abnormal skin rashes Lymphatics: ( - ) new lymphadenopathy, ( - ) easy bruising Neurological: ( - ) numbness, ( - ) tingling, ( - ) new weaknesses Behavioral/Psych: ( - ) mood change, ( - ) new changes  All other systems were reviewed with the patient and are negative.  PHYSICAL EXAMINATION: ECOG PERFORMANCE STATUS: 1 - Symptomatic but completely ambulatory  Vitals:   09/01/22 1113  BP:  126/82  Pulse: 75  Resp: 16  Temp: 97.6 F (36.4 C)  SpO2: 99%   Filed Weights   09/01/22 1113  Weight: 224 lb 11.2 oz (101.9 kg)    GENERAL: well appearing female in NAD  SKIN: skin color, texture, turgor are normal, no rashes or significant lesions EYES: conjunctiva are pink and non-injected, sclera clear OROPHARYNX: no exudate, no erythema; lips, buccal mucosa, and tongue normal  NECK: supple, non-tender LYMPH:  no palpable lymphadenopathy in the cervical, supraclavicular lymph nodes.  LUNGS: clear to auscultation and percussion with normal breathing effort HEART: regular rate & rhythm and no murmurs and no lower extremity edema ABDOMEN: soft, non-tender, non-distended, normal bowel sounds Musculoskeletal: no cyanosis of digits and no clubbing  PSYCH: alert & oriented x 3, fluent speech NEURO: no focal motor/sensory deficits BREAST: Palpable mass in the right outer lower quadrant.  LABORATORY DATA:  I have reviewed the data as listed    Latest Ref Rng & Units 09/01/2022   10:28 AM 04/12/2022    9:07 AM 03/02/2022    2:17 PM  CBC  WBC 4.0 - 10.5 K/uL 6.2  8.3  8.4   Hemoglobin 12.0 - 15.0 g/dL 13.9  13.1  12.3   Hematocrit 36.0 - 46.0 % 41.9  39.7  38.5   Platelets 150 - 400 K/uL 308  277  334        Latest Ref Rng & Units 09/01/2022   10:28 AM 04/12/2022    9:07 AM 03/29/2022    9:19 AM  CMP  Glucose 70 - 99 mg/dL 113  124  106   BUN 8 - 23 mg/dL '20  23  21   '$ Creatinine 0.44 - 1.00 mg/dL 0.92  0.88  0.92   Sodium 135 - 145 mmol/L 137  138  136   Potassium 3.5 - 5.1 mmol/L 3.2  3.6  3.5   Chloride 98 - 111 mmol/L 98  98  96  CO2 22 - 32 mmol/L 31  33  30   Calcium 8.9 - 10.3 mg/dL 9.4  9.7  9.4   Total Protein 6.5 - 8.1 g/dL 7.4  7.2    Total Bilirubin 0.3 - 1.2 mg/dL 0.5  0.3    Alkaline Phos 38 - 126 U/L 99  85    AST 15 - 41 U/L 18  20    ALT 0 - 44 U/L 15  17      ASSESSMENT & PLAN CHELSI WARR is a 77 y.o. female returns for follow-up.   #Iron  deficiency anemia: --Secondary to malasorbption due to bariatric surgery --Placed referral to GI but consultation never took place. Consider re-referral if iron deficiency reoccurs.  --Received IV feraheme 510 mg x 2 doses on 01/18/2022 and 01/25/2022.  --Labs show anemia has resolved with Hgb 13.9. Iron levels show no evidence of deficiency.  --No need for additional IV iron infusions at this time.   #H/O bariatric surgery: --B12 levels normal at last check.   --Recheck periodically, every 6 months.   #H/O right breast cancer: --Medical oncologist was Dr. Jana Hakim. Completed treatment in October 2016 and completed 5 years of Tamoxifen therapy in January 2022.  --Currently on surveillance with PCP.  --Last mammogram was 12/29/20223 which was negative for malignancy. Next due in one year.   #Left breast mass and left axillary lymphadenopathy: # CLL Rai Stage II  --CT chest from 04/07/2022 showed numerous enlarged and nonenlarged left axillary lymph nodes measuring up to 16 mm short axis --Physical exam findings were concerning for palpable mass in the outer lower quadrant of the left breast.  -- left axillary [place] with biopsy confirmed CLL/SLL -- No indication to start treatment at this time.   Follow up: --6 months: labs and follow up  No orders of the defined types were placed in this encounter.   All questions were answered. The patient knows to call the clinic with any problems, questions or concerns.  I have spent a total of 30 minutes minutes of face-to-face and non-face-to-face time, preparing to see the patient, performing a medically appropriate examination, counseling and educating the patient, ordering medications/tests/procedures, documenting clinical information in the electronic health record, independently interpreting results and communicating results to the patient, and care coordination.   Dede Query PA-C Dept of Hematology and [place] at  [facility] Phone: [phone]

## 2022-09-02 ENCOUNTER — Encounter: Payer: Self-pay | Admitting: Physician Assistant

## 2022-09-04 MED ORDER — POTASSIUM CHLORIDE CRYS ER 10 MEQ PO TBCR: 10.0000 meq | EXTENDED_RELEASE_TABLET | Freq: Every day | ORAL | 0 refills | Status: DC

## 2022-09-04 NOTE — Addendum Note (Signed)
Addended by: Lincoln Brigham on: 09/04/2022 03:53 PM   Modules accepted: Orders

## 2022-10-26 ENCOUNTER — Other Ambulatory Visit: Payer: Self-pay | Admitting: Family Medicine

## 2022-11-29 ENCOUNTER — Other Ambulatory Visit: Payer: Self-pay | Admitting: Family Medicine

## 2022-12-14 ENCOUNTER — Telehealth: Payer: Self-pay | Admitting: Family Medicine

## 2022-12-14 NOTE — Telephone Encounter (Signed)
That may also be over the counter ? Possibly cheaper?   Could substitute ferrous sulfate 325 mg (otc) if she can take it

## 2022-12-14 NOTE — Telephone Encounter (Signed)
Jennifer Bartlett from [facility] called over and had some questions regarding her FERREX 150 150 MG capsule. She stated that the generic brand isn't covered and was wondering if there was something else she take. Please advise. Thank you!

## 2022-12-15 NOTE — Telephone Encounter (Signed)
Left message to return call to our office.  Checked cover my meds and out of pocket price is as low as 5.95 for #30 at [facility].

## 2022-12-18 NOTE — Telephone Encounter (Signed)
Patient called in returning call she received. 

## 2022-12-18 NOTE — Telephone Encounter (Signed)
Left VM requesting pt to call the office back   Clarified with Joellen it's GoodRx website that has Ferrex 150 mg for around $6-$10 depending on what pharmacy they want to use.

## 2022-12-20 ENCOUNTER — Encounter: Payer: Self-pay | Admitting: Internal Medicine

## 2022-12-20 ENCOUNTER — Ambulatory Visit (INDEPENDENT_AMBULATORY_CARE_PROVIDER_SITE_OTHER): Payer: Medicare Other | Admitting: Internal Medicine

## 2022-12-20 VITALS — BP 122/80 | HR 84 | Temp 97.4°F | Ht 64.0 in | Wt 227.0 lb

## 2022-12-20 DIAGNOSIS — K2100 Gastro-esophageal reflux disease with esophagitis, without bleeding: Secondary | ICD-10-CM

## 2022-12-20 MED ORDER — OMEPRAZOLE 20 MG PO CPDR: 20.0000 mg | DELAYED_RELEASE_CAPSULE | Freq: Two times a day (BID) | ORAL | 1 refills | Status: DC

## 2022-12-20 NOTE — Progress Notes (Signed)
Subjective:    Patient ID: Jennifer Bartlett, female    DOB: [birth year], 77 y.o.   MRN: [id number]  HPI Here due to acid reflux  Taking iron pills---bothered her a little Thought she would get used to them Went away but now worse again Pain in RUQ as well---was due to for cholecystectomy and hernia repair but put on hold due to lymphoma  Gets acid reflux that she can feel in her upper chest Stopping other vitamins didn't help Takes the omeprazole daily--but not on an empty stomach Slight trouble swallowing---food (takes smaller bites)  Current Outpatient Medications on File Prior to Visit  Medication Sig Dispense Refill   acetaminophen (TYLENOL) 500 MG tablet Take 1,000 mg by mouth every 6 (six) hours as needed for moderate pain.     ALPRAZolam (XANAX) 0.5 MG tablet 1 pill by mouth twice daily as needed for anxiety before procedure 6 tablet 0   Biotin 10 MG TABS Take 10 mg by mouth in the morning and at bedtime.     CALCIUM PO Take 1 tablet by mouth daily.     chlorthalidone (HYGROTON) 25 MG tablet Take 1 tablet (25 mg total) by mouth daily. 90 tablet 3   Cholecalciferol (D3 VITAMIN PO) Take 4,000 Units by mouth daily.     diclofenac Sodium (VOLTAREN) 1 % GEL Apply 2 g topically 4 (four) times daily as needed (pain).     FERREX 150 150 MG capsule Take 1 capsule by mouth once daily 90 capsule 0   hydrochlorothiazide (HYDRODIURIL) 25 MG tablet Take 25 mg by mouth daily.     ketoconazole (NIZORAL) 2 % cream APPLY TOPICALLY DAILY (Patient taking differently: 1 Application daily as needed (irritation under breast).) 15 g 0   levothyroxine (SYNTHROID) 75 MCG tablet Take 1 tablet by mouth once daily 90 tablet 0   loratadine (CLARITIN) 10 MG tablet Take 10 mg by mouth daily as needed for allergies.     Multiple Vitamin (MULTIVITAMIN) capsule Take 1 capsule by mouth daily.     naproxen sodium (ALEVE) 220 MG tablet Take 220 mg by mouth daily as needed (pain).     omeprazole (PRILOSEC) 20 MG  capsule Take 1 capsule by mouth once daily 90 capsule 1   OVER THE COUNTER MEDICATION Take 1 capsule by mouth daily. InstaFlex     potassium chloride SA (KLOR-CON M) 10 MEQ tablet Take 1 tablet (10 mEq total) by mouth daily. 7 tablet 0   No current facility-administered medications on file prior to visit.    Allergies  Allergen Reactions   Crestor [Rosuvastatin Calcium] Other (See Comments)    Myalgia    Lipitor [Atorvastatin Calcium] Other (See Comments)    Aches and pains   Penicillins Shortness Of Breath and Swelling   Zocor [Simvastatin - High Dose] Other (See Comments)    Muscle/joint aches    Past Medical History:  Diagnosis Date   Anemia    Anxiety    Arthritis    hands, knees   Breast cancer ([facility])    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   Depression    Family history of adverse reaction to anesthesia    mother has had PONV   GERD (gastroesophageal reflux disease)    Hot flashes    Hyperlipidemia    Hypertension    Hypoglycemia    Hypothyroidism    Obesity    Personal history of radiation therapy    Vitamin B  12 deficiency     Past Surgical History:  Procedure Laterality Date   ABDOMINAL HYSTERECTOMY     BREAST BIOPSY     BREAST LUMPECTOMY Right    2016   BREAST REDUCTION SURGERY Bilateral 09/14/2020   Procedure: MAMMARY REDUCTION  (BREAST);  Surgeon: Glenna Fellows, MD;  Location: [facility];  Service: Plastics;  Laterality: Bilateral;   GASTRIC BYPASS  08/2003   KNEE SURGERY     left   PARTIAL HYSTERECTOMY  1990's   bleeding   RADIOACTIVE SEED GUIDED PARTIAL MASTECTOMY WITH AXILLARY SENTINEL LYMPH NODE BIOPSY Right 03/09/2015   Procedure: RADIOACTIVE SEED GUIDED RIGHT PARTIAL MASTECTOMY WITH AXILLARY SENTINEL LYMPH NODE BIOPSY;  Surgeon: Almond Lint, MD;  Location: [facility];  Service: General;  Laterality: Right;   REDUCTION MAMMAPLASTY Bilateral 08/2020   TUBAL LIGATION       Family History  Problem Relation Age of Onset   Heart disease Mother        MI   Hypertension Mother    Alcohol abuse Father    Heart disease Father        smoker   Hypertension Father    Breast cancer Paternal Grandmother    Diabetes Maternal Aunt     Social History   Socioeconomic History   Marital status: Married    Spouse name: Not on file   Number of children: 3   Years of education: Not on file   Highest education level: Not on file  Occupational History    Employer: [employer]  Tobacco Use   Smoking status: Never   Smokeless tobacco: Never  Vaping Use   Vaping Use: Never used  Substance and Sexual Activity   Alcohol use: No   Drug use: No   Sexual activity: Yes    Partners: Male    Birth control/protection: Surgical    Comment: Hyst.  Other Topics Concern   Not on file  Social History Narrative   Regular walking for exercise.   Social Determinants of Health   Financial Resource Strain: Low Risk  (06/06/2022)   Overall Financial Resource Strain (CARDIA)    Difficulty of Paying Living Expenses: Not hard at all  Food Insecurity: No Food Insecurity (06/06/2022)   Hunger Vital Sign    Worried About Running Out of Food in the Last Year: Never true    Ran Out of Food in the Last Year: Never true  Transportation Needs: No Transportation Needs (06/06/2022)   PRAPARE - Administrator, Civil Service (Medical): No    Lack of Transportation (Non-Medical): No  Physical Activity: Sufficiently Active (06/06/2022)   Exercise Vital Sign    Days of Exercise per Week: 3 days    Minutes of Exercise per Session: 60 min  Stress: No Stress Concern Present (06/06/2022)   [employer] of Occupational Health - Occupational Stress Questionnaire    Feeling of Stress : Not at all  Social Connections: Socially Integrated (06/06/2022)   Social Connection and Isolation Panel [NHANES]    Frequency of Communication with Friends and Family: More than three  times a week    Frequency of Social Gatherings with Friends and Family: More than three times a week    Attends Religious Services: More than 4 times per year    Active Member of [employer] or Organizations: Yes    Attends Engineer, structural: More than 4 times per year    Marital Status: Married  Catering manager Violence:  Not At Risk (06/06/2022)   Humiliation, Afraid, Rape, and Kick questionnaire    Fear of Current or Ex-Partner: No    Emotionally Abused: No    Physically Abused: No    Sexually Abused: No   Review of Systems Bowels are fine Appetite is okay--but only eats a little at a time     Objective:   Physical Exam Constitutional:      Appearance: Normal appearance.  Cardiovascular:     Rate and Rhythm: Normal rate and regular rhythm.     Heart sounds: No murmur heard.    No gallop.  Pulmonary:     Effort: Pulmonary effort is normal.     Breath sounds: Normal breath sounds. No wheezing or rales.  Abdominal:     Palpations: Abdomen is soft.     Tenderness: There is no abdominal tenderness.  Musculoskeletal:     Cervical back: Neck supple.  Lymphadenopathy:     Cervical: Cervical adenopathy present.  Neurological:     Mental Status: She is alert.            Assessment & Plan:

## 2022-12-20 NOTE — Assessment & Plan Note (Signed)
Now with exacerbation apparently from iron---mild esophagitis Will have her stop the iron for now Increase the omeprazole to bid---on empty stomach If symptoms gone--can try the iron 2-3 days per week (recent CBC and iron studies normal) Also can reduce omeprazole after a month if better

## 2022-12-20 NOTE — Patient Instructions (Signed)
Please stop the iron for now. Increase the omeprazole to 20mg  twice a day---empty stomach (like 1 hour before breakfast and bedtime). If your symptoms resolve, you can try the iron again---like 2-3 days per week. After a month, if your symptoms are still gone, you can try decreasing the omeprazole back to just once a day

## 2022-12-21 NOTE — Telephone Encounter (Signed)
Left VM requesting pt to call the office back 

## 2023-01-01 NOTE — Telephone Encounter (Signed)
Unable to reach patient. Left voicemail to return call to our office.   

## 2023-01-02 NOTE — Telephone Encounter (Signed)
Pt returned call to office   # [phone]

## 2023-01-29 ENCOUNTER — Other Ambulatory Visit: Payer: Self-pay | Admitting: Family Medicine

## 2023-01-30 NOTE — Telephone Encounter (Signed)
Pt hasn't had TSH in over a year, please advise

## 2023-01-30 NOTE — Telephone Encounter (Signed)
Please schedule annual exam with fasting labs prior and refill until then

## 2023-01-31 NOTE — Telephone Encounter (Signed)
LVM for patient to call back and schedule

## 2023-01-31 NOTE — Telephone Encounter (Signed)
Patient has been scheduled

## 2023-02-12 ENCOUNTER — Telehealth: Payer: Self-pay | Admitting: Family Medicine

## 2023-02-12 DIAGNOSIS — E7800 Pure hypercholesterolemia, unspecified: Secondary | ICD-10-CM

## 2023-02-12 DIAGNOSIS — E538 Deficiency of other specified B group vitamins: Secondary | ICD-10-CM

## 2023-02-12 DIAGNOSIS — E559 Vitamin D deficiency, unspecified: Secondary | ICD-10-CM

## 2023-02-12 DIAGNOSIS — R7303 Prediabetes: Secondary | ICD-10-CM

## 2023-02-12 DIAGNOSIS — I10 Essential (primary) hypertension: Secondary | ICD-10-CM

## 2023-02-12 DIAGNOSIS — E039 Hypothyroidism, unspecified: Secondary | ICD-10-CM

## 2023-02-12 NOTE — Telephone Encounter (Signed)
-----   Message from Alvina Chou sent at 02/02/2023 11:37 AM EDT ----- Regarding: Lab orders for Tuesday, 7.16.24 Patient is scheduled for CPX labs, please order future labs, Thanks , Camelia Eng

## 2023-02-13 ENCOUNTER — Other Ambulatory Visit: Payer: Medicare Other

## 2023-02-13 DIAGNOSIS — R7303 Prediabetes: Secondary | ICD-10-CM | POA: Diagnosis not present

## 2023-02-13 DIAGNOSIS — E559 Vitamin D deficiency, unspecified: Secondary | ICD-10-CM | POA: Diagnosis not present

## 2023-02-13 DIAGNOSIS — E039 Hypothyroidism, unspecified: Secondary | ICD-10-CM | POA: Diagnosis not present

## 2023-02-13 DIAGNOSIS — I10 Essential (primary) hypertension: Secondary | ICD-10-CM | POA: Diagnosis not present

## 2023-02-13 DIAGNOSIS — E538 Deficiency of other specified B group vitamins: Secondary | ICD-10-CM | POA: Diagnosis not present

## 2023-02-13 DIAGNOSIS — E7800 Pure hypercholesterolemia, unspecified: Secondary | ICD-10-CM

## 2023-02-13 LAB — COMPREHENSIVE METABOLIC PANEL
ALT: 12 U/L (ref 0–35)
AST: 16 U/L (ref 0–37)
Albumin: 4 g/dL (ref 3.5–5.2)
Alkaline Phosphatase: 98 U/L (ref 39–117)
BUN: 17 mg/dL (ref 6–23)
CO2: 32 mEq/L (ref 19–32)
Calcium: 9.2 mg/dL (ref 8.4–10.5)
Chloride: 96 mEq/L (ref 96–112)
Creatinine, Ser: 0.91 mg/dL (ref 0.40–1.20)
GFR: 61.57 mL/min (ref >60.00)
Glucose, Bld: 104 mg/dL — ABNORMAL HIGH (ref 70–99)
Potassium: 3.7 mEq/L (ref 3.5–5.1)
Sodium: 134 mEq/L — ABNORMAL LOW (ref 135–145)
Total Bilirubin: 0.4 mg/dL (ref 0.2–1.2)
Total Protein: 6.7 g/dL (ref 6.0–8.3)

## 2023-02-13 LAB — LIPID PANEL
Cholesterol: 237 mg/dL — ABNORMAL HIGH (ref 0–200)
HDL: 46 mg/dL (ref >39.00)
LDL Cholesterol: 173 mg/dL — ABNORMAL HIGH (ref 0–99)
NonHDL: 190.52
Total CHOL/HDL Ratio: 5
Triglycerides: 89 mg/dL (ref 0.0–149.0)
VLDL: 17.8 mg/dL (ref 0.0–40.0)

## 2023-02-13 LAB — TSH: TSH: 4.16 u[IU]/mL (ref 0.35–5.50)

## 2023-02-13 LAB — CBC WITH DIFFERENTIAL/PLATELET
Basophils Absolute: 0.1 10*3/uL (ref 0.0–0.1)
Basophils Relative: 1.1 % (ref 0.0–3.0)
Eosinophils Absolute: 0.2 10*3/uL (ref 0.0–0.7)
Eosinophils Relative: 2.9 % (ref 0.0–5.0)
HCT: 39.4 % (ref 36.0–46.0)
Hemoglobin: 12.8 g/dL (ref 12.0–15.0)
Lymphocytes Relative: 24.3 % (ref 12.0–46.0)
Lymphs Abs: 1.5 10*3/uL (ref 0.7–4.0)
MCHC: 32.5 g/dL (ref 30.0–36.0)
MCV: 92.9 fl (ref 78.0–100.0)
Monocytes Absolute: 0.7 10*3/uL (ref 0.1–1.0)
Monocytes Relative: 10.9 % (ref 3.0–12.0)
Neutro Abs: 3.7 10*3/uL (ref 1.4–7.7)
Neutrophils Relative %: 60.8 % (ref 43.0–77.0)
Platelets: 289 10*3/uL (ref 150.0–400.0)
RBC: 4.24 Mil/uL (ref 3.87–5.11)
RDW: 13.4 % (ref 11.5–15.5)
WBC: 6.2 10*3/uL (ref 4.0–10.5)

## 2023-02-13 LAB — VITAMIN D 25 HYDROXY (VIT D DEFICIENCY, FRACTURES): VITD: 51.82 ng/mL (ref 30.00–100.00)

## 2023-02-13 LAB — HEMOGLOBIN A1C: Hgb A1c MFr Bld: 6 % (ref 4.6–6.5)

## 2023-02-13 LAB — VITAMIN B12: Vitamin B-12: 699 pg/mL (ref 211–911)

## 2023-02-22 ENCOUNTER — Encounter: Payer: Self-pay | Admitting: Family Medicine

## 2023-02-22 ENCOUNTER — Ambulatory Visit (INDEPENDENT_AMBULATORY_CARE_PROVIDER_SITE_OTHER): Payer: Medicare Other | Admitting: Family Medicine

## 2023-02-22 VITALS — BP 126/70 | HR 95 | Temp 97.8°F | Ht 63.0 in | Wt 223.4 lb

## 2023-02-22 DIAGNOSIS — E7800 Pure hypercholesterolemia, unspecified: Secondary | ICD-10-CM | POA: Diagnosis not present

## 2023-02-22 DIAGNOSIS — I10 Essential (primary) hypertension: Secondary | ICD-10-CM

## 2023-02-22 DIAGNOSIS — Z79899 Other long term (current) drug therapy: Secondary | ICD-10-CM

## 2023-02-22 DIAGNOSIS — E559 Vitamin D deficiency, unspecified: Secondary | ICD-10-CM

## 2023-02-22 DIAGNOSIS — R7303 Prediabetes: Secondary | ICD-10-CM

## 2023-02-22 DIAGNOSIS — E041 Nontoxic single thyroid nodule: Secondary | ICD-10-CM

## 2023-02-22 DIAGNOSIS — D508 Other iron deficiency anemias: Secondary | ICD-10-CM

## 2023-02-22 DIAGNOSIS — E039 Hypothyroidism, unspecified: Secondary | ICD-10-CM | POA: Diagnosis not present

## 2023-02-22 DIAGNOSIS — R5382 Chronic fatigue, unspecified: Secondary | ICD-10-CM

## 2023-02-22 DIAGNOSIS — Z6841 Body Mass Index (BMI) 40.0 and over, adult: Secondary | ICD-10-CM

## 2023-02-22 DIAGNOSIS — E538 Deficiency of other specified B group vitamins: Secondary | ICD-10-CM | POA: Diagnosis not present

## 2023-02-22 DIAGNOSIS — C9110 Chronic lymphocytic leukemia of B-cell type not having achieved remission: Secondary | ICD-10-CM

## 2023-02-22 DIAGNOSIS — Z0000 Encounter for general adult medical examination without abnormal findings: Secondary | ICD-10-CM | POA: Diagnosis not present

## 2023-02-22 DIAGNOSIS — Z853 Personal history of malignant neoplasm of breast: Secondary | ICD-10-CM | POA: Diagnosis not present

## 2023-02-22 DIAGNOSIS — K219 Gastro-esophageal reflux disease without esophagitis: Secondary | ICD-10-CM | POA: Diagnosis not present

## 2023-02-22 DIAGNOSIS — R011 Cardiac murmur, unspecified: Secondary | ICD-10-CM

## 2023-02-22 DIAGNOSIS — Z1211 Encounter for screening for malignant neoplasm of colon: Secondary | ICD-10-CM

## 2023-02-22 DIAGNOSIS — E2839 Other primary ovarian failure: Secondary | ICD-10-CM

## 2023-02-22 MED ORDER — EZETIMIBE 10 MG PO TABS: 10.0000 mg | ORAL_TABLET | Freq: Every day | ORAL | 3 refills | Status: DC

## 2023-02-22 NOTE — Assessment & Plan Note (Signed)
Lab Results  Component Value Date   VITAMINB12 699 02/13/2023   Last vitamin D Lab Results  Component Value Date   VD25OH 51.82 02/13/2023

## 2023-02-22 NOTE — Assessment & Plan Note (Signed)
Cbc and iron are stable No longer taking oral -does not toelrate  Gets infusions prn with heme/onc

## 2023-02-22 NOTE — Assessment & Plan Note (Signed)
Followed by oncology In setting of breast cancer as well  Left axillary adenopathy   Few LN in left neck today   In obs currently

## 2023-02-22 NOTE — Patient Instructions (Addendum)
If you are interested in the new shingles vaccine (Shingrix) - call your local pharmacy to check on coverage and availability  If affordable, get on a wait list at your pharmacy to get the vaccine.  I ordered cologuard  If you don't hear from the company in 1-2 weeks let us know   Add some strength training to your routine, this is important for bone and brain health and can reduce your risk of falls and help your body use insulin properly and regulate weight  Light weights, exercise bands , and internet videos are a good way to start  Yoga (chair or regular), machines , floor exercises or a gym with machines are also good options    Try to get protein with every meal/snack .The following are examples of protein in diet  Meat  Fish  Eggs  Dairy products  Soy products  Oat milk  Almond milk Nuts and nut butters  Dried beans   Start generic zetia for cholesterol  Avoid red meat/ fried foods/ egg yolks/ fatty breakfast meats/ butter, cheese and high fat dairy/ and shellfish  Schedule fasting labs for cholesterol in 6 weeks   I will work on ordering ultrasound of thyroid this fall  Also for echocardiogram for the new murmur I heard  Let us know if you don't hear in 1-2 weeks

## 2023-02-22 NOTE — Assessment & Plan Note (Signed)
Last vitamin D Lab Results  Component Value Date   VD25OH 51.82 02/13/2023   Vitamin D level is therapeutic with current supplementation Disc importance of this to bone and overall health

## 2023-02-22 NOTE — Assessment & Plan Note (Signed)
Reviewed health habits including diet and exercise and skin cancer prevention Reviewed appropriate screening tests for age  Also reviewed health mt list, fam hx and immunization status , as well as social and family history   See HPI Labs reviewed and ordered Plans to get shingrix at pharmacy  Also flu shot in fall  Mammogram utd 07/2021 in setting of cancer /continues onc follow up  Cologuard ordered Dexa normal 2022  No falls/fracture and D level is normal PHq of 6 due to fatigue and medical problems, overall good attitude

## 2023-02-22 NOTE — Assessment & Plan Note (Signed)
Controlled Lab Results  Component Value Date   [id number] 699 02/13/2023    Seeing heme/onc

## 2023-02-22 NOTE — Assessment & Plan Note (Signed)
Deconditioned with illness Discussed gradual return to reg activities and exercise

## 2023-02-22 NOTE — Assessment & Plan Note (Signed)
Disc goals for lipids and reasons to control them Rev last labs with pt Rev low sat fat diet in detail Intol to all statins/all doses  Open to zetia 10 mg daily  Current LDL 173 Discussed vasc risks   Will call if zetia side effects Lab planned 6 weeks

## 2023-02-22 NOTE — Assessment & Plan Note (Signed)
Dexa utd

## 2023-02-22 NOTE — Assessment & Plan Note (Signed)
[place] scheduled for 1 y recall

## 2023-02-22 NOTE — Assessment & Plan Note (Signed)
Lab Results  Component Value Date   HGBA1C 6.0 02/13/2023   disc imp of low glycemic diet and wt loss to prevent DM2

## 2023-02-22 NOTE — Assessment & Plan Note (Signed)
bp is improved but not yet to goal BP Readings from Last 1 Encounters:  02/22/23 126/70   Plan to continue chlorthalidone 25 mg daily Labs reviewed

## 2023-02-22 NOTE — Assessment & Plan Note (Signed)
Cologuard ordered

## 2023-02-22 NOTE — Assessment & Plan Note (Signed)
Heard today  Loud[facility] at [facility] ordered  No symptoms

## 2023-02-22 NOTE — Assessment & Plan Note (Signed)
Omeprazole bid for GERD with esophagitis Watching renal fxn and vitamin levels

## 2023-02-22 NOTE — Progress Notes (Signed)
Subjective:    Patient ID: Jennifer Bartlett, female    DOB: October 14, 1945, 77 y.o.   MRN: [id number]  HPI  Here for health maintenance exam and to review chronic medical problems   Wt Readings from Last 3 Encounters:  02/22/23 223 lb 6 oz (101.3 kg)  12/20/22 227 lb (103 kg)  09/01/22 224 lb 11.2 oz (101.9 kg)   39.57 kg/m  Vitals:   02/22/23 1055  BP: 126/70  Pulse: 95  Temp: 97.8 F (36.6 C)  SpO2: 98%    Immunization History  Administered Date(s) Administered   Fluad Quad(high Dose 65+) 05/15/2019, 05/11/2020, 05/20/2021, 08/02/2022   Influenza Split 04/25/2011   Influenza Whole 08/01/2003, 04/20/2010   Influenza,inj,Quad PF,6+ Mos 05/15/2013, 04/20/2014, 06/30/2015, 07/06/2017   Influenza-Unspecified 05/14/2016   PFIZER(Purple Top)SARS-COV-2 Vaccination 09/22/2019, 10/13/2019   Pneumococcal Conjugate-13 08/18/2014   Pneumococcal Polysaccharide-23 09/06/2012   Td 11/17/2004   Tdap 07/04/2016   Zoster, Live 06/21/2010    Health Maintenance Due  Topic Date Due   Fecal DNA (Cologuard)  01/05/2023   Feels good today  Has ups and downs  Gets tired easily and takes breaks   Shingrix- will check on coverage  Thinks it is covered  Will get flu shot in the fall   Mammogram 06/2022 -personal history of breast cancer  Continues onc follow up now  Self breast exam: no new lumps / has frequent regular exams   No gyn problems   Colon cancer screening -cologuard neg 12/2019  Due for that  Will order    Dexa 04/2021 normal BMD Falls -none  Fractures-none  Supplements  Last vitamin D Lab Results  Component Value Date   VD25OH 51.82 02/13/2023    Exercise : trying to move more  Using a pedaler  Some upper body exercises in chair   Eats less meat     Mood    02/22/2023   11:00 AM 06/06/2022    8:55 AM 06/01/2021    9:40 AM 05/11/2020    9:24 AM 03/26/2020   11:29 AM  Depression screen PHQ 2/9  Decreased Interest 2 0 0 0 0  Down, Depressed,  Hopeless 0 0 0 0 0  PHQ - 2 Score 2 0 0 0 0  Altered sleeping 0   0 0  Tired, decreased energy 2   0 0  Change in appetite 2   0 0  Feeling bad or failure about yourself  0   0 0  Trouble concentrating 0   0 0  Moving slowly or fidgety/restless 0   0 0  Suicidal thoughts 0   0 0  PHQ-9 Score 6   0 0  Difficult doing work/chores Not difficult at all    Not difficult at all   Now seeing oncology for  Iron def anemia History of breast cancer  Left axillary adenopathy   CLL/SLL  HTN bp is stable today  No cp or palpitations or headaches or edema  No side effects to medicines  BP Readings from Last 3 Encounters:  02/22/23 126/70  12/20/22 122/80  09/01/22 126/82    Chlorthalidone 25 mg daily   Klor con   Lab Results  Component Value Date   NA 134 (L) 02/13/2023   K 3.7 02/13/2023   CO2 32 02/13/2023   GLUCOSE 104 (H) 02/13/2023   BUN 17 02/13/2023   CREATININE 0.91 02/13/2023   CALCIUM 9.2 02/13/2023   GFR 61.57 02/13/2023   EGFR >60 06/01/2017  GFRNONAA >60 09/01/2022   GERD with esophagitis  Omeprazole bid   Iron def Had iron infusion June 2023 Lab Results  Component Value Date   WBC 6.2 02/13/2023   HGB 12.8 02/13/2023   HCT 39.4 02/13/2023   MCV 92.9 02/13/2023   PLT 289.0 02/13/2023  Stopped the ferrex for a month - caused bad heartburn   Lab Results  Component Value Date   IRON 94 09/01/2022   TIBC 360 09/01/2022   FERRITIN 69 09/01/2022     B12 def Lab Results  Component Value Date   VITAMINB12 699 02/13/2023    Hyperlipidemia Lab Results  Component Value Date   CHOL 237 (H) 02/13/2023   CHOL 189 12/01/2021   CHOL 214 (H) 06/01/2021   Lab Results  Component Value Date   HDL 46.00 02/13/2023   HDL 44.50 12/01/2021   HDL 48.20 06/01/2021   Lab Results  Component Value Date   LDLCALC 173 (H) 02/13/2023   LDLCALC 122 (H) 12/01/2021   LDLCALC 140 (H) 06/01/2021   Lab Results  Component Value Date   TRIG 89.0 02/13/2023   TRIG  114.0 12/01/2021   TRIG 132.0 06/01/2021   Lab Results  Component Value Date   CHOLHDL 5 02/13/2023   CHOLHDL 4 12/01/2021   CHOLHDL 4 06/01/2021   Lab Results  Component Value Date   LDLDIRECT 139.2 08/25/2013   LDLDIRECT 151.5 03/31/2011   Intolerant to all statin doses  The 10-year ASCVD risk score (Arnett DK, et al., 2019) is: 20.7%   Values used to calculate the score:     Age: 34 years     Sex: Female     Is Non-Hispanic African American: No     Diabetic: No     Tobacco smoker: No     Systolic Blood Pressure: 126 mmHg     Is BP treated: Yes     HDL Cholesterol: 46 mg/dL     Total Cholesterol: 237 mg/dL    Hypothyroidism  Pt has no clinical changes No change in energy level/ hair or skin/ edema and no tremor Lab Results  Component Value Date   TSH 4.16 02/13/2023    Levothyroxine 75 mcg daily   Thyroid nodule  [place]  03/2022  Met criteria to re image in a year   Prediabetes Lab Results  Component Value Date   HGBA1C 6.0 02/13/2023     Patient Active Problem List   Diagnosis Date Noted   Systolic murmur 02/22/2023   CLL (chronic lymphocytic leukemia) (HCC) 02/22/2023   Elevated brain natriuretic peptide (BNP) level 04/27/2022   Left thyroid nodule 04/10/2022   Abnormal EKG 03/06/2022   Ankle edema, bilateral 03/06/2022   Iron deficiency anemia 01/11/2022   Gallstones 12/08/2021   Abdominal pain, right upper quadrant 12/05/2021   Fatigue 12/05/2021   Umbilical hernia 12/05/2021   Pedal edema 12/05/2021   DOE (dyspnea on exertion) 12/05/2021   Medicare annual wellness visit, subsequent 06/01/2021   Current use of proton pump inhibitor 06/01/2021   Hypothyroid 01/07/2020   History of breast cancer 12/05/2019   Prediabetes 07/04/2016   Routine general medical examination at a health care facility 07/04/2016   Encounter for hepatitis C screening test for low risk patient 06/29/2016   Estrogen deficiency 01/11/2016   Malignant neoplasm of upper-inner  quadrant of right breast in female, estrogen receptor positive (HCC) 02/26/2015   Colon cancer screening 09/06/2012   Vitamin D deficiency 12/28/2009   STRESS REACTION, ACUTE, WITH EMOTIONAL  DISTURBANCE 12/07/2009   Vitamin B12 deficiency 03/07/2007   HYPERCHOLESTEROLEMIA 03/07/2007   BMI 40.0-44.9, adult (HCC) 03/07/2007   Essential hypertension 03/07/2007   GERD 03/07/2007   Past Medical History:  Diagnosis Date   Anemia    Anxiety    Arthritis    hands, knees   Breast cancer (HCC)    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   Depression    Family history of adverse reaction to anesthesia    mother has had PONV   GERD (gastroesophageal reflux disease)    Hot flashes    Hyperlipidemia    Hypertension    Hypoglycemia    Hypothyroidism    Obesity    Personal history of radiation therapy    Vitamin B 12 deficiency    Past Surgical History:  Procedure Laterality Date   ABDOMINAL HYSTERECTOMY     BREAST BIOPSY     BREAST LUMPECTOMY Right    2016   BREAST REDUCTION SURGERY Bilateral 09/14/2020   Procedure: MAMMARY REDUCTION  (BREAST);  Surgeon: Glenna Fellows, MD;  Location: [facility];  Service: Plastics;  Laterality: Bilateral;   GASTRIC BYPASS  08/2003   KNEE SURGERY     left   PARTIAL HYSTERECTOMY  1990's   bleeding   RADIOACTIVE SEED GUIDED PARTIAL MASTECTOMY WITH AXILLARY SENTINEL LYMPH NODE BIOPSY Right 03/09/2015   Procedure: RADIOACTIVE SEED GUIDED RIGHT PARTIAL MASTECTOMY WITH AXILLARY SENTINEL LYMPH NODE BIOPSY;  Surgeon: Almond Lint, MD;  Location: [facility];  Service: General;  Laterality: Right;   REDUCTION MAMMAPLASTY Bilateral 08/2020   TUBAL LIGATION     Social History   Tobacco Use   Smoking status: Never   Smokeless tobacco: Never  Vaping Use   Vaping status: Never Used  Substance Use Topics   Alcohol use: No   Drug use: No   Family History  Problem Relation Age of Onset    Heart disease Mother        MI   Hypertension Mother    Alcohol abuse Father    Heart disease Father        smoker   Hypertension Father    Breast cancer Paternal Grandmother    Diabetes Maternal Aunt    Allergies  Allergen Reactions   Crestor [Rosuvastatin Calcium] Other (See Comments)    Myalgia    Lipitor [Atorvastatin Calcium] Other (See Comments)    Aches and pains   Penicillins Shortness Of Breath and Swelling   Zocor [Simvastatin - High Dose] Other (See Comments)    Muscle/joint aches   Ferrex 150 [Polysaccharide Iron Complex]     Indigestion   Current Outpatient Medications on File Prior to Visit  Medication Sig Dispense Refill   acetaminophen (TYLENOL) 500 MG tablet Take 1,000 mg by mouth every 6 (six) hours as needed for moderate pain.     ALPRAZolam (XANAX) 0.5 MG tablet 1 pill by mouth twice daily as needed for anxiety before procedure 6 tablet 0   Biotin 10 MG TABS Take 10 mg by mouth in the morning and at bedtime.     CALCIUM PO Take 1 tablet by mouth daily.     chlorthalidone (HYGROTON) 25 MG tablet Take 1 tablet (25 mg total) by mouth daily. 90 tablet 3   Cholecalciferol (D3 VITAMIN PO) Take 4,000 Units by mouth daily.     diclofenac Sodium (VOLTAREN) 1 % GEL Apply 2 g topically 4 (four) times daily as needed (pain).  ketoconazole (NIZORAL) 2 % cream APPLY TOPICALLY DAILY (Patient taking differently: 1 Application daily as needed (irritation under breast).) 15 g 0   levothyroxine (SYNTHROID) 75 MCG tablet Take 1 tablet (75 mcg total) by mouth daily before breakfast. 90 tablet 0   loratadine (CLARITIN) 10 MG tablet Take 10 mg by mouth daily as needed for allergies.     Multiple Vitamin (MULTIVITAMIN) capsule Take 1 capsule by mouth daily.     naproxen sodium (ALEVE) 220 MG tablet Take 220 mg by mouth daily as needed (pain).     omeprazole (PRILOSEC) 20 MG capsule Take 1 capsule (20 mg total) by mouth 2 (two) times daily before a meal. 180 capsule 1   OVER THE  COUNTER MEDICATION Take 1 capsule by mouth daily. InstaFlex     potassium chloride SA (KLOR-CON M) 10 MEQ tablet Take 1 tablet (10 mEq total) by mouth daily. 7 tablet 0   No current facility-administered medications on file prior to visit.    Review of Systems  Constitutional:  Positive for fatigue. Negative for activity change, appetite change, fever and unexpected weight change.  HENT:  Negative for congestion, ear pain, rhinorrhea, sinus pressure and sore throat.   Eyes:  Negative for pain, redness and visual disturbance.  Respiratory:  Negative for cough, shortness of breath and wheezing.   Cardiovascular:  Negative for chest pain and palpitations.  Gastrointestinal:  Negative for abdominal pain, blood in stool, constipation and diarrhea.  Endocrine: Negative for polydipsia and polyuria.  Genitourinary:  Negative for dysuria, frequency and urgency.  Musculoskeletal:  Negative for arthralgias, back pain and myalgias.  Skin:  Negative for pallor and rash.  Allergic/Immunologic: Negative for environmental allergies.  Neurological:  Positive for weakness. Negative for dizziness, syncope and headaches.  Hematological:  Negative for adenopathy. Does not bruise/bleed easily.  Psychiatric/Behavioral:  Negative for decreased concentration and dysphoric mood. The patient is not nervous/anxious.        Objective:   Physical Exam Constitutional:      General: She is not in acute distress.    Appearance: Normal appearance. She is well-developed. She is obese. She is not ill-appearing or diaphoretic.  HENT:     Head: Normocephalic and atraumatic.     Right Ear: Tympanic membrane, ear canal and external ear normal.     Left Ear: Tympanic membrane, ear canal and external ear normal.     Nose: Nose normal. No congestion.     Mouth/Throat:     Mouth: Mucous membranes are moist.     Pharynx: Oropharynx is clear. No posterior oropharyngeal erythema.  Eyes:     General: No scleral icterus.     Extraocular Movements: Extraocular movements intact.     Conjunctiva/sclera: Conjunctivae normal.     Pupils: Pupils are equal, round, and reactive to light.  Neck:     Thyroid: No thyromegaly.     Vascular: No carotid bruit or JVD.     Comments: Left posterior cervical :LNs  Cardiovascular:     Rate and Rhythm: Normal rate and regular rhythm.     Pulses: Normal pulses.     Heart sounds: Normal heart sounds.     No gallop.  Pulmonary:     Effort: Pulmonary effort is normal. No respiratory distress.     Breath sounds: Normal breath sounds. No wheezing.     Comments: Good air exch Chest:     Chest wall: No tenderness.  Abdominal:     General: Bowel sounds are normal.  There is no distension or abdominal bruit.     Palpations: Abdomen is soft. There is no mass.     Tenderness: There is no abdominal tenderness.     Hernia: No hernia is present.  Genitourinary:    Comments: Breast exam done by onc provider Musculoskeletal:        General: No tenderness. Normal range of motion.     Cervical back: Normal range of motion and neck supple. No rigidity. No muscular tenderness.     Right lower leg: No edema.     Left lower leg: No edema.     Comments: No kyphosis   Lymphadenopathy:     Cervical: Cervical adenopathy present.  Skin:    General: Skin is warm and dry.     Coloration: Skin is not pale.     Findings: No erythema or rash.     Comments: Fair  Solar lentigines diffusely   Neurological:     Mental Status: She is alert. Mental status is at baseline.     Cranial Nerves: No cranial nerve deficit.     Motor: No abnormal muscle tone.     Coordination: Coordination normal.     Gait: Gait normal.     Deep Tendon Reflexes: Reflexes are normal and symmetric. Reflexes normal.  Psychiatric:        Mood and Affect: Mood normal.        Cognition and Memory: Cognition and memory normal.           Assessment & Plan:   Problem List Items Addressed This Visit        Cardiovascular and Mediastinum   Essential hypertension    bp is improved but not yet to goal BP Readings from Last 1 Encounters:  02/22/23 126/70   Plan to continue chlorthalidone 25 mg daily Labs reviewed       Relevant Medications   ezetimibe (ZETIA) 10 MG tablet     Digestive   GERD    Omeprazole bid for GERD with esophagitis Watching renal fxn and vitamin levels        Endocrine   Left thyroid nodule    [place] scheduled for 1 y recall      Relevant Orders   US THYROID   Hypothyroid    Hypothyroidism  Pt has no clinical changes No change in energy level/ hair or skin/ edema and no tremor Lab Results  Component Value Date   TSH 4.16 02/13/2023     Continues levothyroxine 75 mcg daily  Ordered US for sept -nodule re check        Other   Vitamin D deficiency    Last vitamin D Lab Results  Component Value Date   VD25OH 51.82 02/13/2023   Vitamin D level is therapeutic with current supplementation Disc importance of this to bone and overall health       Vitamin B12 deficiency    Controlled Lab Results  Component Value Date   VITAMINB12 699 02/13/2023    Seeing heme/onc      Systolic murmur    Heard today  [facility]oud[facility] at [facility] ordered  No symptoms       Routine general medical examination at a health care facility - Primary    Reviewed health habits including diet and exercise and skin cancer prevention Reviewed appropriate screening tests for age  Also reviewed health mt list, fam hx and immunization status , as well as social and family history   See HPI Labs  reviewed and ordered Plans to get shingrix at pharmacy  Also flu shot in fall  Mammogram utd 07/2021 in setting of cancer /continues onc follow up  Cologuard ordered Dexa normal 2022  No falls/fracture and D level is normal PHq of 6 due to fatigue and medical problems, overall good attitude       Prediabetes    Lab Results  Component Value Date   HGBA1C 6.0 02/13/2023   disc imp  of low glycemic diet and wt loss to prevent DM2       Iron deficiency anemia    Cbc and iron are stable No longer taking oral -does not toelrate  Gets infusions prn with heme/onc      HYPERCHOLESTEROLEMIA    Disc goals for lipids and reasons to control them Rev last labs with pt Rev low sat fat diet in detail Intol to all statins/all doses  Open to zetia 10 mg daily  Current LDL 173 Discussed vasc risks   Will call if zetia side effects Lab planned 6 weeks       Relevant Medications   ezetimibe (ZETIA) 10 MG tablet   History of breast cancer    Ongoing onc care      Fatigue    Deconditioned with illness Discussed gradual return to reg activities and exercise       Estrogen deficiency    Dexa utd      Current use of proton pump inhibitor    Lab Results  Component Value Date   VITAMINB12 699 02/13/2023   Last vitamin D Lab Results  Component Value Date   VD25OH 51.82 02/13/2023         Colon cancer screening    Cologuard ordered       Relevant Orders   Cologuard   CLL (chronic lymphocytic leukemia) (HCC)    Followed by oncology In setting of breast cancer as well  Left axillary adenopathy   Few LN in left neck today   In obs currently      BMI 40.0-44.9, adult (HCC)    Discussed how this problem influences overall health and the risks it imposes  Reviewed plan for weight loss with lower calorie diet (via better food choices (lower glycemic and portion control) along with exercise building up to or more than 30 minutes 5 days per week including some aerobic activity and strength training

## 2023-02-22 NOTE — Assessment & Plan Note (Signed)
Discussed how this problem influences overall health and the risks it imposes  Reviewed plan for weight loss with lower calorie diet (via better food choices (lower glycemic and portion control) along with exercise building up to or more than 30 minutes 5 days per week including some aerobic activity and strength training

## 2023-02-22 NOTE — Assessment & Plan Note (Signed)
Ongoing onc care

## 2023-02-22 NOTE — Assessment & Plan Note (Signed)
Hypothyroidism  Pt has no clinical changes No change in energy level/ hair or skin/ edema and no tremor Lab Results  Component Value Date   TSH 4.16 02/13/2023     Continues levothyroxine 75 mcg daily  Ordered US for sept -nodule re check

## 2023-03-01 ENCOUNTER — Ambulatory Visit
Admission: RE | Admit: 2023-03-01 | Discharge: 2023-03-01 | Disposition: A | Discharge status: Home / Self Care | Payer: Medicare Other | Source: Ambulatory Visit | Attending: Family Medicine | Admitting: Family Medicine

## 2023-03-01 ENCOUNTER — Other Ambulatory Visit: Payer: Self-pay | Admitting: Physician Assistant

## 2023-03-01 DIAGNOSIS — D509 Iron deficiency anemia, unspecified: Secondary | ICD-10-CM

## 2023-03-01 DIAGNOSIS — E041 Nontoxic single thyroid nodule: Secondary | ICD-10-CM | POA: Diagnosis not present

## 2023-03-01 DIAGNOSIS — C8300 Small cell B-cell lymphoma, unspecified site: Secondary | ICD-10-CM

## 2023-03-02 ENCOUNTER — Inpatient Hospital Stay: Payer: Medicare Other | Attending: Physician Assistant

## 2023-03-02 ENCOUNTER — Encounter: Payer: Self-pay | Admitting: Physician Assistant

## 2023-03-02 ENCOUNTER — Inpatient Hospital Stay: Payer: Medicare Other | Admitting: Physician Assistant

## 2023-03-02 VITALS — BP 180/60 | HR 73 | Temp 98.0°F | Resp 15 | Wt 224.9 lb

## 2023-03-02 DIAGNOSIS — C8300 Small cell B-cell lymphoma, unspecified site: Secondary | ICD-10-CM | POA: Diagnosis not present

## 2023-03-02 DIAGNOSIS — C9110 Chronic lymphocytic leukemia of B-cell type not having achieved remission: Secondary | ICD-10-CM | POA: Diagnosis not present

## 2023-03-02 DIAGNOSIS — Z853 Personal history of malignant neoplasm of breast: Secondary | ICD-10-CM | POA: Diagnosis not present

## 2023-03-02 DIAGNOSIS — D509 Iron deficiency anemia, unspecified: Secondary | ICD-10-CM

## 2023-03-02 LAB — LACTATE DEHYDROGENASE: LDH: 160 U/L (ref 98–192)

## 2023-03-02 LAB — CBC WITH DIFFERENTIAL (CANCER CENTER ONLY)
Abs Immature Granulocytes: 0.02 10*3/uL (ref 0.00–0.07)
Basophils Absolute: 0.1 10*3/uL (ref 0.0–0.1)
Basophils Relative: 1 %
Eosinophils Absolute: 0.2 10*3/uL (ref 0.0–0.5)
Eosinophils Relative: 3 %
HCT: 38.5 % (ref 36.0–46.0)
Hemoglobin: 13 g/dL (ref 12.0–15.0)
Immature Granulocytes: 0 %
Lymphocytes Relative: 18 %
Lymphs Abs: 1.3 10*3/uL (ref 0.7–4.0)
MCH: 30.6 pg (ref 26.0–34.0)
MCHC: 33.8 g/dL (ref 30.0–36.0)
MCV: 90.6 fL (ref 80.0–100.0)
Monocytes Absolute: 0.6 10*3/uL (ref 0.1–1.0)
Monocytes Relative: 8 %
Neutro Abs: 5 10*3/uL (ref 1.7–7.7)
Neutrophils Relative %: 70 %
Platelet Count: 316 10*3/uL (ref 150–400)
RBC: 4.25 MIL/uL (ref 3.87–5.11)
RDW: 12.9 % (ref 11.5–15.5)
WBC Count: 7.1 10*3/uL (ref 4.0–10.5)
nRBC: 0 % (ref 0.0–0.2)

## 2023-03-02 LAB — IRON AND IRON BINDING CAPACITY (CC-WL,HP ONLY)
Iron: 78 ug/dL (ref 28–170)
Saturation Ratios: 22 % (ref 10.4–31.8)
TIBC: 357 ug/dL (ref 250–450)
UIBC: 279 ug/dL (ref 148–442)

## 2023-03-02 LAB — CMP (CANCER CENTER ONLY)
ALT: 14 U/L (ref 0–44)
AST: 17 U/L (ref 15–41)
Albumin: 4 g/dL (ref 3.5–5.0)
Alkaline Phosphatase: 93 U/L (ref 38–126)
Anion gap: 8 (ref 5–15)
BUN: 24 mg/dL — ABNORMAL HIGH (ref 8–23)
CO2: 29 mmol/L (ref 22–32)
Calcium: 9.1 mg/dL (ref 8.9–10.3)
Chloride: 100 mmol/L (ref 98–111)
Creatinine: 0.94 mg/dL (ref 0.44–1.00)
GFR, Estimated: >60 mL/min (ref >60)
Glucose, Bld: 105 mg/dL — ABNORMAL HIGH (ref 70–99)
Potassium: 3.8 mmol/L (ref 3.5–5.1)
Sodium: 137 mmol/L (ref 135–145)
Total Bilirubin: 0.5 mg/dL (ref 0.3–1.2)
Total Protein: 7.3 g/dL (ref 6.5–8.1)

## 2023-03-02 LAB — FERRITIN: Ferritin: 65 ng/mL (ref 11–307)

## 2023-03-02 NOTE — Progress Notes (Unsigned)
[facility] [phone]   [phone]  PROGRESS NOTE  Patient Care Team: Tower, Audrie Gallus, MD as PCP - General [place] Davonna Belling, NP (Inactive) as Nurse Practitioner (Obstetrics and Gynecology) Almond Lint, MD as Consulting Physician (General Surgery) Pershing Proud, RN as Registered Nurse Donnelly Angelica, RN as Registered Nurse Damita Lack, Marcy Salvo, NP as Nurse Practitioner (Nurse Practitioner) Dannielle Huh, MD as Consulting Physician (Orthopedic Surgery)  CHIEF COMPLAINTS/PURPOSE OF CONSULTATION:  -Iron deficiency anemia -History of right breast cancer -Left axillary lymphadenopathy--biopsy-proven CLL/SLL  HISTORY OF PRESENTING ILLNESS:  Jennifer Bartlett 77 y.o. female returns for a follow up for CLL/SLL and iron deficiency anemia. She was last seen on 09/01/2022. She is unaccompanied for this visit.   On exam today, Jennifer Bartlett denies any major changes in her health in the interim since her last visit. She continues to feel some lymph nodes in the back of her neck. These nodes are non-tender and don't seem to be enlarging. Her energy is overall stable with occasional episode of fatigue. Her appetite and weight are stable. She denies nausea, vomiting or bowel habit changes. She denies easy bruising or signs of active bleeding.  She denies fevers, chills, sweats, shortness of breath, chest pain or cough. She has no other complaints. Rest of the 10 point ROS is below.   MEDICAL HISTORY:  Past Medical History:  Diagnosis Date   Anemia    Anxiety    Arthritis    hands, knees   Breast cancer (HCC)    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   Depression    Family history of adverse reaction to anesthesia    mother has had PONV   GERD (gastroesophageal reflux disease)    Hot flashes    Hyperlipidemia    Hypertension    Hypoglycemia    Hypothyroidism    Obesity    Personal history of radiation therapy     Vitamin B 12 deficiency     SURGICAL HISTORY: Past Surgical History:  Procedure Laterality Date   ABDOMINAL HYSTERECTOMY     BREAST BIOPSY     BREAST LUMPECTOMY Right    2016   BREAST REDUCTION SURGERY Bilateral 09/14/2020   Procedure: MAMMARY REDUCTION  (BREAST);  Surgeon: Glenna Fellows, MD;  Location: [facility];  Service: Plastics;  Laterality: Bilateral;   GASTRIC BYPASS  08/2003   KNEE SURGERY     left   PARTIAL HYSTERECTOMY  1990's   bleeding   RADIOACTIVE SEED GUIDED PARTIAL MASTECTOMY WITH AXILLARY SENTINEL LYMPH NODE BIOPSY Right 03/09/2015   Procedure: RADIOACTIVE SEED GUIDED RIGHT PARTIAL MASTECTOMY WITH AXILLARY SENTINEL LYMPH NODE BIOPSY;  Surgeon: Almond Lint, MD;  Location: [facility];  Service: General;  Laterality: Right;   REDUCTION MAMMAPLASTY Bilateral 08/2020   TUBAL LIGATION      SOCIAL HISTORY: Social History   Socioeconomic History   Marital status: Married    Spouse name: Not on file   Number of children: 3   Years of education: Not on file   Highest education level: Not on file  Occupational History    Employer: [employer]  Tobacco Use   Smoking status: Never   Smokeless tobacco: Never  Vaping Use   Vaping status: Never Used  Substance and Sexual Activity   Alcohol use: No   Drug use: No   Sexual activity: Yes    Partners: Male    Birth control/protection: Surgical  Comment: Hyst.  Other Topics Concern   Not on file  Social History Narrative   Regular walking for exercise.   Social Determinants of Health   Financial Resource Strain: Low Risk  (06/06/2022)   Overall Financial Resource Strain (CARDIA)    Difficulty of Paying Living Expenses: Not hard at all  Food Insecurity: No Food Insecurity (06/06/2022)   Hunger Vital Sign    Worried About Running Out of Food in the Last Year: Never true    Ran Out of Food in the Last Year: Never true  Transportation Needs: No Transportation Needs  (06/06/2022)   PRAPARE - Administrator, Civil Service (Medical): No    Lack of Transportation (Non-Medical): No  Physical Activity: Sufficiently Active (06/06/2022)   Exercise Vital Sign    Days of Exercise per Week: 3 days    Minutes of Exercise per Session: 60 min  Stress: No Stress Concern Present (06/06/2022)   [employer] of Occupational Health - Occupational Stress Questionnaire    Feeling of Stress : Not at all  Social Connections: Socially Integrated (06/06/2022)   Social Connection and Isolation Panel [NHANES]    Frequency of Communication with Friends and Family: More than three times a week    Frequency of Social Gatherings with Friends and Family: More than three times a week    Attends Religious Services: More than 4 times per year    Active Member of [employer] or Organizations: Yes    Attends Engineer, structural: More than 4 times per year    Marital Status: Married  Catering manager Violence: Not At Risk (06/06/2022)   Humiliation, Afraid, Rape, and Kick questionnaire    Fear of Current or Ex-Partner: No    Emotionally Abused: No    Physically Abused: No    Sexually Abused: No    FAMILY HISTORY: Family History  Problem Relation Age of Onset   Heart disease Mother        MI   Hypertension Mother    Alcohol abuse Father    Heart disease Father        smoker   Hypertension Father    Breast cancer Paternal Grandmother    Diabetes Maternal Aunt     ALLERGIES:  is allergic to crestor [rosuvastatin calcium], lipitor [atorvastatin calcium], penicillins, zocor [simvastatin - high dose], and ferrex 150 [polysaccharide iron complex].  MEDICATIONS:  Current Outpatient Medications  Medication Sig Dispense Refill   acetaminophen (TYLENOL) 500 MG tablet Take 1,000 mg by mouth every 6 (six) hours as needed for moderate pain.     ALPRAZolam (XANAX) 0.5 MG tablet 1 pill by mouth twice daily as needed for anxiety before procedure 6 tablet 0   Biotin 10  MG TABS Take 10 mg by mouth in the morning and at bedtime.     CALCIUM PO Take 1 tablet by mouth daily.     chlorthalidone (HYGROTON) 25 MG tablet Take 1 tablet (25 mg total) by mouth daily. 90 tablet 3   Cholecalciferol (D3 VITAMIN PO) Take 4,000 Units by mouth daily.     diclofenac Sodium (VOLTAREN) 1 % GEL Apply 2 g topically 4 (four) times daily as needed (pain).     ezetimibe (ZETIA) 10 MG tablet Take 1 tablet (10 mg total) by mouth daily. 90 tablet 3   ketoconazole (NIZORAL) 2 % cream APPLY TOPICALLY DAILY (Patient taking differently: 1 Application daily as needed (irritation under breast).) 15 g 0   levothyroxine (SYNTHROID) 75 MCG  tablet Take 1 tablet (75 mcg total) by mouth daily before breakfast. 90 tablet 0   loratadine (CLARITIN) 10 MG tablet Take 10 mg by mouth daily as needed for allergies.     Multiple Vitamin (MULTIVITAMIN) capsule Take 1 capsule by mouth daily.     naproxen sodium (ALEVE) 220 MG tablet Take 220 mg by mouth daily as needed (pain).     omeprazole (PRILOSEC) 20 MG capsule Take 1 capsule (20 mg total) by mouth 2 (two) times daily before a meal. 180 capsule 1   OVER THE COUNTER MEDICATION Take 1 capsule by mouth daily. InstaFlex     potassium chloride SA (KLOR-CON M) 10 MEQ tablet Take 1 tablet (10 mEq total) by mouth daily. 7 tablet 0   No current facility-administered medications for this visit.    REVIEW OF SYSTEMS:   Constitutional: ( - ) fevers, ( - )  chills , ( - ) night sweats Eyes: ( - ) blurriness of vision, ( - ) double vision, ( - ) watery eyes Ears, nose, mouth, throat, and face: ( - ) mucositis, ( - ) sore throat Respiratory: ( - ) cough, (- ) dyspnea, ( - ) wheezes Cardiovascular: ( - ) palpitation, ( - ) chest discomfort, ( - ) lower extremity swelling Gastrointestinal:  ( - ) nausea, ( - ) heartburn, ( - ) change in bowel habits Skin: ( - ) abnormal skin rashes Lymphatics: ( + ) new lymphadenopathy, ( - ) easy bruising Neurological: ( - )  numbness, ( - ) tingling, ( - ) new weaknesses Behavioral/Psych: ( - ) mood change, ( - ) new changes  All other systems were reviewed with the patient and are negative.  PHYSICAL EXAMINATION: ECOG PERFORMANCE STATUS: 1 - Symptomatic but completely ambulatory  Vitals:   03/02/23 1053  BP: (!) 180/60  Pulse: 73  Resp: 15  Temp: 98 F (36.7 C)  SpO2: 99%    Filed Weights   03/02/23 1053  Weight: 224 lb 14.4 oz (102 kg)    GENERAL: well appearing female in NAD  SKIN: skin color, texture, turgor are normal, no rashes or significant lesions EYES: conjunctiva are pink and non-injected, sclera clear LYMPH:  no palpable lymphadenopathy in the cervical, supraclavicular lymph nodes. Subcentimeter lymph nodes appreciated posterior cervical region bilaterally. LUNGS: clear to auscultation and percussion with normal breathing effort HEART: regular rate & rhythm and no murmurs and no lower extremity edema Musculoskeletal: no cyanosis of digits and no clubbing  PSYCH: alert & oriented x 3, fluent speech NEURO: no focal motor/sensory deficits  LABORATORY DATA:  I have reviewed the data as listed    Latest Ref Rng & Units 03/02/2023   10:06 AM 02/13/2023    8:14 AM 09/01/2022   10:28 AM  CBC  WBC 4.0 - 10.5 K/uL 7.1  6.2  6.2   Hemoglobin 12.0 - 15.0 g/dL 65.7  84.6  96.2   Hematocrit 36.0 - 46.0 % 38.5  39.4  41.9   Platelets 150 - 400 K/uL 316  289.0  308        Latest Ref Rng & Units 03/02/2023   10:06 AM 02/13/2023    8:14 AM 09/01/2022   10:28 AM  CMP  Glucose 70 - 99 mg/dL 952  841  324   BUN 8 - 23 mg/dL 24  17  20    Creatinine 0.44 - 1.00 mg/dL 4.01  0.27  2.53   Sodium 135 - 145 mmol/L 137  134  137   Potassium 3.5 - 5.1 mmol/L 3.8  3.7  3.2   Chloride 98 - 111 mmol/L 100  96  98   CO2 22 - 32 mmol/L 29  32  31   Calcium 8.9 - 10.3 mg/dL 9.1  9.2  9.4   Total Protein 6.5 - 8.1 g/dL 7.3  6.7  7.4   Total Bilirubin 0.3 - 1.2 mg/dL 0.5  0.4  0.5   Alkaline Phos 38 - 126 U/L  93  98  99   AST 15 - 41 U/L 17  16  18    ALT 0 - 44 U/L 14  12  15      ASSESSMENT & PLAN Jennifer Bartlett is a 77 y.o. female returns for follow-up.   #Iron deficiency anemia: --Secondary to malasorbption due to bariatric surgery --Placed referral to GI but consultation never took place. Consider re-referral if iron deficiency reoccurs.  --Received IV feraheme 510 mg x 2 doses on 01/18/2022 and 01/25/2022.  --Labs show anemia has resolved with Hgb 13.0. Iron levels show ferritin 65, iron 78, TIBC 357, saturation 22%.  --No need for additional IV iron infusions at this time.   #H/O bariatric surgery: --B12 levels normal from 02/13/2023.  --Recheck periodically, every 6 months.   #H/O right breast cancer: --Medical oncologist was Dr. Darnelle Catalan. Completed treatment in October 2016 and completed 5 years of Tamoxifen therapy in January 2022.  --Currently on surveillance with PCP.  --Last mammogram was 12/29/20223 which was negative for malignancy. Next due in one year.   #Left breast mass and left axillary lymphadenopathy: # CLL Rai Stage II  --CT chest from 04/07/2022 showed numerous enlarged and nonenlarged left axillary lymph nodes measuring up to 16 mm short axis --Physical exam findings were concerning for palpable mass in the outer lower quadrant of the left breast.  -- left axillary [place] with biopsy on 04/28/2023 confirmed CLL/SLL -- No indication to start treatment at this time with no evidence of cytopenias or B-symptoms.   Follow up: --6 months: labs and follow up  Orders Placed This Encounter  Procedures   MM 3D SCREENING MAMMOGRAM BILATERAL BREAST    Standing Status:   Future    Standing Expiration Date:   03/01/2024    Order Specific Question:   Reason for Exam (SYMPTOM  OR DIAGNOSIS REQUIRED)    Answer:   history of right breast cancer    Order Specific Question:   Preferred imaging location?    Answer:   [facility]    All questions were answered. The patient  knows to call the clinic with any problems, questions or concerns.  I have spent a total of 30 minutes minutes of face-to-face and non-face-to-face time, preparing to see the patient, performing a medically appropriate examination, counseling and educating the patient, ordering medications/tests/procedures, documenting clinical information in the electronic health record, independently interpreting results and communicating results to the patient, and care coordination.   Georga Kaufmann PA-C Dept of Hematology and Oncology [facility] Phone: [phone]

## 2023-03-05 ENCOUNTER — Encounter: Payer: Self-pay | Admitting: Physician Assistant

## 2023-03-05 DIAGNOSIS — Z1231 Encounter for screening mammogram for malignant neoplasm of breast: Secondary | ICD-10-CM

## 2023-03-06 ENCOUNTER — Encounter: Payer: Self-pay | Admitting: Physician Assistant

## 2023-03-08 DIAGNOSIS — Z1211 Encounter for screening for malignant neoplasm of colon: Secondary | ICD-10-CM | POA: Diagnosis not present

## 2023-03-28 ENCOUNTER — Encounter: Payer: Self-pay | Admitting: Physician Assistant

## 2023-03-28 ENCOUNTER — Ambulatory Visit (HOSPITAL_COMMUNITY): Payer: Medicare Other | Attending: Internal Medicine

## 2023-03-28 DIAGNOSIS — R011 Cardiac murmur, unspecified: Secondary | ICD-10-CM | POA: Diagnosis not present

## 2023-03-28 DIAGNOSIS — M25471 Effusion, right ankle: Secondary | ICD-10-CM | POA: Insufficient documentation

## 2023-03-28 DIAGNOSIS — M25472 Effusion, left ankle: Secondary | ICD-10-CM | POA: Insufficient documentation

## 2023-03-28 LAB — ECHOCARDIOGRAM COMPLETE
Area-P 1/2: 2.87 cm2
P 1/2 time: 332 msec
S' Lateral: 2.5 cm

## 2023-03-29 NOTE — Patient Instructions (Signed)
I ordered referral Cannot close this -unusre why

## 2023-04-03 ENCOUNTER — Other Ambulatory Visit: Payer: Self-pay | Admitting: Family Medicine

## 2023-04-10 DIAGNOSIS — H26491 Other secondary cataract, right eye: Secondary | ICD-10-CM | POA: Diagnosis not present

## 2023-04-23 DIAGNOSIS — H26493 Other secondary cataract, bilateral: Secondary | ICD-10-CM | POA: Diagnosis not present

## 2023-05-08 ENCOUNTER — Other Ambulatory Visit: Payer: Self-pay | Admitting: Family Medicine

## 2023-05-09 ENCOUNTER — Ambulatory Visit: Payer: Medicare Other

## 2023-05-10 ENCOUNTER — Ambulatory Visit (INDEPENDENT_AMBULATORY_CARE_PROVIDER_SITE_OTHER): Payer: Medicare Other

## 2023-05-10 DIAGNOSIS — Z23 Encounter for immunization: Secondary | ICD-10-CM | POA: Diagnosis not present

## 2023-05-21 DIAGNOSIS — H26493 Other secondary cataract, bilateral: Secondary | ICD-10-CM | POA: Diagnosis not present

## 2023-06-14 ENCOUNTER — Ambulatory Visit: Payer: Medicare Other | Attending: Cardiology | Admitting: Cardiology

## 2023-06-14 ENCOUNTER — Encounter: Payer: Self-pay | Admitting: Cardiology

## 2023-06-14 VITALS — BP 130/80 | HR 80 | Ht 64.5 in | Wt 217.0 lb

## 2023-06-14 DIAGNOSIS — Z79899 Other long term (current) drug therapy: Secondary | ICD-10-CM

## 2023-06-14 DIAGNOSIS — R011 Cardiac murmur, unspecified: Secondary | ICD-10-CM | POA: Diagnosis not present

## 2023-06-14 DIAGNOSIS — I10 Essential (primary) hypertension: Secondary | ICD-10-CM

## 2023-06-14 MED ORDER — SPIRONOLACTONE 25 MG PO TABS: 12.5000 mg | ORAL_TABLET | Freq: Every day | ORAL | 3 refills | Status: DC

## 2023-06-14 NOTE — Progress Notes (Signed)
Cardiology Office Note:  .   Date:  06/14/2023  ID:  Jennifer Bartlett, DOB 1945-12-06, MRN [id number] PCP: Jennifer Pimple, MD  [facility] Providers Cardiologist:  None     History of Present Illness: Jennifer Bartlett is a 77 y.o. female Discussed with the use of AI scribe   History of Present Illness   The 77 year old patient with a history of aortic sclerosis and systolic murmur, as identified on an echocardiogram in August 2024, presented for evaluation. The echocardiogram showed an ejection fraction of 70%, mild LVH, mildly to moderately dilated left atrium, trivial mitral regurgitation, and moderate calcification of the aortic valve with mild regurgitation. There was no evidence of stenosis.  The patient reported feeling generally well, but noted difficulty sleeping flat due to a sensation of heavy pressure and difficulty breathing. This necessitated sleeping in a health chair in a semi-reclined position. The patient also reported a sensation of tightness in the legs, from the feet to the knees.  The patient's medical history includes multiple surgeries, and a postponed surgery for gallbladder and hernia due to a diagnosis of pneumonia in the preoperative period. The patient has a family history of heart disease, with both parents passing away from massive heart attacks in their fifties. The patient is a non-smoker, with a spouse who quit smoking 52 years ago.  The patient's current medications include chlorthalidone 25mg  and potassium . The patient was not on any new medications at the time of the consultation.           Studies Reviewed: Marland Kitchen   EKG Interpretation Date/Time:  Thursday June 14 2023 11:02:11 EST Ventricular Rate:  77 PR Interval:    QRS Duration:  148 QT Interval:  432 QTC Calculation: 488 R Axis:   -63  Text Interpretation: Wide QRS rhythm Right bundle branch block Left anterior fascicular block Bifascicular block Minimal voltage criteria  for LVH, may be normal variant ( R in aVL ) Lateral infarct , age undetermined When compared with ECG of 02-Mar-2022 14:22, Wide QRS rhythm has replaced Sinus rhythm Confirmed by Donato Schultz ([id number]) on 06/14/2023 11:08:02 AM    Results LABS Creatinine: 0.94 (06/14/2023) LDL: 173 (01/2023) Hemoglobin A1c: 6.0 (06/14/2023)  DIAGNOSTIC Echocardiogram: Ejection fraction 70%, mild LVH, mild to moderately dilated left atrium, trivial mitral regurgitation, moderate calcification of the aortic valve with mild regurgitation, no evidence of stenosis (03/2023)  Risk Assessment/Calculations:            Physical Exam:   VS:  BP 130/80   Pulse 80   Ht 5' 4.5" (1.638 m)   Wt 217 lb (98.4 kg)   LMP 06/08/1989   SpO2 96%   BMI 36.67 kg/m    Wt Readings from Last 3 Encounters:  06/14/23 217 lb (98.4 kg)  03/02/23 224 lb 14.4 oz (102 kg)  02/22/23 223 lb 6 oz (101.3 kg)    GEN: Well nourished, well developed in no acute distress NECK: No JVD; No carotid bruits CARDIAC: RRR, soft systolic murmur, no rubs, no gallops RESPIRATORY:  Clear to auscultation without rales, wheezing or rhonchi  ABDOMEN: Soft, non-tender, non-distended EXTREMITIES:  No edema; No deformity   ASSESSMENT AND PLAN: .    Assessment and Plan    Aortic Sclerosis   Aortic sclerosis identified as the source of the systolic murmur. Echocardiogram from August 2024 showed moderate calcification of the aortic valve with mild regurgitation, no evidence of stenosis. The condition is likely age-related  and currently does not require intervention. Explained that the calcification can cause a murmur but does not currently impede valve function. Discussed the importance of periodic monitoring to ensure no progression to stenosis.   - Monitor with periodic echocardiograms every couple of years    Orthopnea   Reports difficulty breathing when lying flat, requiring her to sleep in a reclined position. This could be related to heart  failure or fluid buildup, but she is currently managing well with a health chair. Blood pressure is well-controlled on chlorthalidone 25 mg. Discussed the potential benefit of adding spironolactone to manage symptoms and reduce the need for potassium supplementation.   - Start spironolactone 12.5 mg once a day   - Stop potassium 10 mEq   - Check blood work in two weeks to monitor potassium and kidney function   - Follow-up in two months with an APP    Peripheral Edema   Reports tightness in the legs from the knees down, which could be related to fluid retention. Currently on chlorthalidone 25 mg, which helps with fluid management. Discussed adding spironolactone to help with fluid retention and reduce the need for potassium supplementation.   - Start spironolactone 12.5 mg once a day   - Stop potassium 10 mEq   - Check blood work in two weeks to monitor potassium and kidney function   - Follow-up in two months with an APP    Hyperlipidemia   Hyperlipidemia managed with Zetia 10 mg. Most recent LDL was 173 in July 2024, and hemoglobin A1c was 6.0. Continue current management.   - Continue Zetia 10 mg    General Health Maintenance   Non-smoker with a family history of heart disease. Both parents died of massive heart attacks in their 70s. Discussed the importance of cardiovascular health monitoring due to family history.   - Encourage continued non-smoking   - Monitor cardiovascular health due to family history    Follow-up   - Check blood work in two weeks   - Follow-up in two months with an APP.               Signed, Donato Schultz, MD

## 2023-06-14 NOTE — Patient Instructions (Signed)
Medication Instructions:  Please start Spironolactone 25 mg - take 1/2 tablet daily. Discontinue your Potassium. Continue all other medications as listed.  *If you need a refill on your cardiac medications before your next appointment, please call your pharmacy*  Lab Work: Please have blood work in 2 weeks (BMP)  If you have labs (blood work) drawn today and your tests are completely normal, you will receive your results only by: MyChart Message (if you have MyChart) OR A paper copy in the mail If you have any lab test that is abnormal or we need to change your treatment, we will call you to review the results.  Follow-Up: At [facility], you and your health needs are our priority.  As part of our continuing mission to provide you with exceptional heart care, we have created designated Provider Care Teams.  These Care Teams include your primary Cardiologist (physician) and Advanced Practice Providers (APPs -  Physician Assistants and Nurse Practitioners) who all work together to provide you with the care you need, when you need it.  We recommend signing up for the patient portal called "MyChart".  Sign up information is provided on this After Visit Summary.  MyChart is used to connect with patients for Virtual Visits (Telemedicine).  Patients are able to view lab/test results, encounter notes, upcoming appointments, etc.  Non-urgent messages can be sent to your provider as well.   To learn more about what you can do with MyChart, go to [email or web address].    Your next appointment:   2 month(s)  Provider:   Jari Favre, PA-C, Robin Searing, NP, Jacolyn Reedy, PA-C, Eligha Bridegroom, NP, Tereso Newcomer, PA-C, or Perlie Gold, PA-C

## 2023-07-02 ENCOUNTER — Other Ambulatory Visit: Payer: Self-pay | Admitting: Cardiology

## 2023-07-02 ENCOUNTER — Ambulatory Visit: Payer: Medicare Other | Attending: Cardiology

## 2023-07-02 DIAGNOSIS — I10 Essential (primary) hypertension: Secondary | ICD-10-CM

## 2023-07-02 DIAGNOSIS — Z79899 Other long term (current) drug therapy: Secondary | ICD-10-CM

## 2023-07-03 LAB — BASIC METABOLIC PANEL
BUN/Creatinine Ratio: 20 (ref 12–28)
BUN: 17 mg/dL (ref 8–27)
CO2: 27 mmol/L (ref 20–29)
Calcium: 9.3 mg/dL (ref 8.7–10.3)
Chloride: 102 mmol/L (ref 96–106)
Creatinine, Ser: 0.86 mg/dL (ref 0.57–1.00)
Glucose: 102 mg/dL — ABNORMAL HIGH (ref 70–99)
Potassium: 4.1 mmol/L (ref 3.5–5.2)
Sodium: 142 mmol/L (ref 134–144)
eGFR: 70 mL/min/{1.73_m2} (ref >59)

## 2023-07-10 ENCOUNTER — Ambulatory Visit (INDEPENDENT_AMBULATORY_CARE_PROVIDER_SITE_OTHER): Payer: Medicare Other

## 2023-07-10 VITALS — Ht 64.5 in | Wt 217.0 lb

## 2023-07-10 DIAGNOSIS — Z0000 Encounter for general adult medical examination without abnormal findings: Secondary | ICD-10-CM

## 2023-07-10 NOTE — Patient Instructions (Signed)
Jennifer Bartlett , Thank you for taking time to come for your Medicare Wellness Visit. I appreciate your ongoing commitment to your health goals. Please review the following plan we discussed and let me know if I can assist you in the future.   Referrals/Orders/Follow-Ups/Clinician Recommendations: none  This is a list of the screening recommended for you and due dates:  Health Maintenance  Topic Date Due   COVID-19 Vaccine (3 - Pfizer risk series) 03/09/2025*   Zoster (Shingles) Vaccine (1 of 2) 05/24/2026*   Mammogram  07/29/2023   Medicare Annual Wellness Visit  07/09/2024   DTaP/Tdap/Td vaccine (3 - Td or Tdap) 07/04/2026   Pneumonia Vaccine  Completed   Flu Shot  Completed   DEXA scan (bone density measurement)  Completed   Hepatitis C Screening  Completed   HPV Vaccine  Aged Out   Cologuard (Stool DNA test)  Discontinued  *Topic was postponed. The date shown is not the original due date.    Advanced directives: (Provided) Advance directive discussed with you today. I have provided a copy for you to complete at home and have notarized. Once this is complete, please bring a copy in to our office so we can scan it into your chart.   Next Medicare Annual Wellness Visit scheduled for next year: Yes 07/10/24 @ 10:50am telephone

## 2023-07-10 NOTE — Progress Notes (Signed)
Subjective:   Jennifer Bartlett is a 77 y.o. female who presents for Medicare Annual (Subsequent) preventive examination.  Visit Complete: Virtual I connected with  Conleigh So Law on 07/10/23 by a audio enabled telemedicine application and verified that I am speaking with the correct person using two identifiers.  Patient Location: Home  Provider Location: Home Office  I discussed the limitations of evaluation and management by telemedicine. The patient expressed understanding and agreed to proceed.  Vital Signs: Because this visit was a virtual/telehealth visit, some criteria may be missing or patient reported. Any vitals not documented were not able to be obtained and vitals that have been documented are patient reported.  Patient Medicare AWV questionnaire was completed by the patient on (not done); I have confirmed that all information answered by patient is correct and no changes since this date.  Cardiac Risk Factors include: advanced age (>6men, >37 women);dyslipidemia;hypertension;obesity (BMI >30kg/m2)    Objective:    Today's Vitals   07/10/23 1004  Weight: 217 lb (98.4 kg)  Height: 5' 4.5" (1.638 m)   Body mass index is 36.67 kg/m.     07/10/2023   10:18 AM 06/06/2022    8:59 AM 03/02/2022    1:56 PM 09/14/2020    6:26 AM 09/07/2020   11:05 AM 03/26/2020   11:26 AM 06/30/2016    8:48 AM  Advanced Directives  Does Patient Have a Medical Advance Directive? No No No No No No Yes  Type of Diplomatic Services operational officer;Living will      Healthcare Power of [place];Living will  Copy of Healthcare Power of Attorney in Chart?       No - copy requested  Would patient like information on creating a medical advance directive?  No - Patient declined Yes (MAU/Ambulatory/Procedural Areas - Information given) No - Patient declined No - Patient declined Yes (MAU/Ambulatory/Procedural Areas - Information given)     Current Medications (verified) Outpatient  Encounter Medications as of 07/10/2023  Medication Sig   acetaminophen (TYLENOL) 500 MG tablet Take 1,000 mg by mouth every 6 (six) hours as needed for moderate pain.   Biotin 10 MG TABS Take 10 mg by mouth in the morning and at bedtime.   CALCIUM PO Take 1 tablet by mouth daily.   chlorthalidone (HYGROTON) 25 MG tablet Take 1 tablet by mouth once daily   Cholecalciferol (D3 VITAMIN PO) Take 4,000 Units by mouth daily.   diclofenac Sodium (VOLTAREN) 1 % GEL Apply 2 g topically 4 (four) times daily as needed (pain).   ezetimibe (ZETIA) 10 MG tablet Take 1 tablet (10 mg total) by mouth daily.   ketoconazole (NIZORAL) 2 % cream APPLY TOPICALLY DAILY (Patient taking differently: 1 Application daily as needed (irritation under breast).)   levothyroxine (SYNTHROID) 75 MCG tablet TAKE 1 TABLET BY MOUTH ONCE DAILY BEFORE BREAKFAST   loratadine (CLARITIN) 10 MG tablet Take 10 mg by mouth daily as needed for allergies.   Multiple Vitamin (MULTIVITAMIN) capsule Take 1 capsule by mouth daily.   naproxen sodium (ALEVE) 220 MG tablet Take 220 mg by mouth daily as needed (pain).   omeprazole (PRILOSEC) 20 MG capsule Take 1 capsule (20 mg total) by mouth 2 (two) times daily before a meal.   OVER THE COUNTER MEDICATION Take 1 capsule by mouth daily. InstaFlex   spironolactone (ALDACTONE) 25 MG tablet Take 0.5 tablets (12.5 mg total) by mouth daily.   ALPRAZolam (XANAX) 0.5 MG tablet 1 pill by mouth twice  daily as needed for anxiety before procedure (Patient not taking: Reported on 06/14/2023)   No facility-administered encounter medications on file as of 07/10/2023.    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   Depression    Family history of adverse reaction to anesthesia    mother has had PONV   GERD (gastroesophageal reflux disease)    Hot flashes    Hyperlipidemia    Hypertension    Hypoglycemia    Hypothyroidism    Obesity    Personal history of radiation therapy    Vitamin B 12 deficiency    Past Surgical History:  Procedure Laterality Date   ABDOMINAL HYSTERECTOMY     BREAST BIOPSY     BREAST LUMPECTOMY Right    2016   BREAST REDUCTION SURGERY Bilateral 09/14/2020   Procedure: MAMMARY REDUCTION  (BREAST);  Surgeon: Glenna Fellows, MD;  Location: [facility];  Service: Plastics;  Laterality: Bilateral;   GASTRIC BYPASS  08/2003   KNEE SURGERY     left   PARTIAL HYSTERECTOMY  1990's   bleeding   RADIOACTIVE SEED GUIDED PARTIAL MASTECTOMY WITH AXILLARY SENTINEL LYMPH NODE BIOPSY Right 03/09/2015   Procedure: RADIOACTIVE SEED GUIDED RIGHT PARTIAL MASTECTOMY WITH AXILLARY SENTINEL LYMPH NODE BIOPSY;  Surgeon: Almond Lint, MD;  Location: [facility];  Service: General;  Laterality: Right;   REDUCTION MAMMAPLASTY Bilateral 08/2020   TUBAL LIGATION     Family History  Problem Relation Age of Onset   Heart disease Mother        MI   Hypertension Mother    Alcohol abuse Father    Heart disease Father        smoker   Hypertension Father    Breast cancer Paternal Grandmother    Diabetes Maternal Aunt    Social History   Socioeconomic History   Marital status: Married    Spouse name: Not on file   Number of children: 3   Years of education: Not on file   Highest education level: Not on file  Occupational History    Employer: [employer]  Tobacco Use   Smoking status: Never   Smokeless tobacco: Never  Vaping Use   Vaping status: Never Used  Substance and Sexual Activity   Alcohol use: No   Drug use: No   Sexual activity: Yes    Partners: Male    Birth control/protection: Surgical     Comment: Hyst.  Other Topics Concern   Not on file  Social History Narrative   Regular walking for exercise.   Social Determinants of Health   Financial Resource Strain: Low Risk  (07/10/2023)   Overall Financial Resource Strain (CARDIA)    Difficulty of Paying Living Expenses: Not hard at all  Food Insecurity: No Food Insecurity (07/10/2023)   Hunger Vital Sign    Worried About Running Out of Food in the Last Year: Never true    Ran Out of Food in the Last Year: Never true  Transportation Needs: No Transportation Needs (07/10/2023)   PRAPARE - Administrator, Civil Service (Medical): No    Lack of Transportation (Non-Medical): No  Physical Activity: Sufficiently Active (07/10/2023)   Exercise Vital Sign    Days of Exercise per Week: 3 days    Minutes of Exercise per Session: 60 min  Stress: No Stress Concern Present (07/10/2023)   [employer] of Occupational Health - Occupational Stress Questionnaire    Feeling of Stress : Not at all  Social Connections: Socially Integrated (07/10/2023)   Social Connection and Isolation Panel [NHANES]    Frequency of Communication with Friends and Family: More than three times a week    Frequency of Social Gatherings with Friends and Family: More than three times a week    Attends Religious Services: More than 4 times per year    Active Member of [employer] or Organizations: Yes    Attends Engineer, structural: More than 4 times per year    Marital Status: Married    Tobacco Counseling Counseling given: Not Answered   Clinical Intake:  Pre-visit preparation completed: No  Pain : No/denies pain    BMI - recorded: 36.67 Nutritional Status: BMI > 30  Obese Nutritional Risks: None Diabetes: No  How often do you need to have someone help you when you read instructions, pamphlets, or other written materials from your doctor or pharmacy?: 1 - Never  Interpreter Needed?: No  Comments: lives with  husband Information entered by :: B.Breya Cass,LPN   Activities of Daily Living    07/10/2023   10:19 AM  In your present state of health, do you have any difficulty performing the following activities:  Hearing? 0  Vision? 0  Difficulty concentrating or making decisions? 0  Walking or climbing stairs? 0  Dressing or bathing? 0  Doing errands, shopping? 0  Preparing Food and eating ? N  Using the Toilet? N  In the past six months, have you accidently leaked urine? Y  Do you have problems with loss of bowel control? N  Managing your Medications? N  Managing your Finances? N  Housekeeping or managing your Housekeeping? N    Patient Care Team: Tower, Audrie Gallus, MD as PCP - General Young, Davonna Belling, NP (Inactive) as Nurse Practitioner (Obstetrics and Gynecology) Almond Lint, MD as Consulting Physician (General Surgery) Pershing Proud, RN as Registered Nurse Donnelly Angelica, RN as Registered Nurse Damita Lack Marcy Salvo, NP as Nurse Practitioner (Nurse Practitioner) Dannielle Huh, MD as Consulting Physician (Orthopedic Surgery)  Indicate any recent Medical Services you may have received from other than [facility] providers in the past year (date may be approximate).     Assessment:   This is a routine wellness examination for Tansey.  Hearing/Vision screen Hearing Screening - Comments:: Pt says her hearing is good Vision Screening - Comments:: Pt says her vision is good after cataract surgery   Goals Addressed             This Visit's Progress    Increase physical activity   On track    Starting 06/30/2016, I will continue to walk at least 20 min 2-3 days per week.        Depression Screen    07/10/2023   10:15 AM 02/22/2023   11:00 AM 06/06/2022    8:55 AM 06/01/2021    9:40 AM 05/11/2020    9:24 AM 03/26/2020   11:29 AM 12/05/2019   12:32 PM  PHQ 2/9 Scores  PHQ - 2 Score 0 2 0 0 0 0 0  PHQ- 9 Score  6   0 0 2    Fall Risk  07/10/2023   10:08 AM 02/22/2023    11:00 AM 06/06/2022    8:58 AM 06/01/2021    9:39 AM 05/11/2020    9:24 AM  Fall Risk   Falls in the past year? 0 0 0 0 0  Number falls in past yr: 0 0 0  0  Injury with Fall? 0 0 0  0  Risk for fall due to : No Fall Risks No Fall Risks No Fall Risks    Follow up Falls prevention discussed;Education provided Falls evaluation completed Falls prevention discussed Falls evaluation completed     MEDICARE RISK AT HOME: Medicare Risk at Home Any stairs in or around the home?: Yes If so, are there any without handrails?: Yes Home free of loose throw rugs in walkways, pet beds, electrical cords, etc?: Yes Adequate lighting in your home to reduce risk of falls?: Yes Life alert?: No Use of a cane, walker or w/c?: No Grab bars in the bathroom?: Yes Shower chair or bench in shower?: Yes Elevated toilet seat or a handicapped toilet?: Yes  TIMED UP AND GO:  Was the test performed?  No    Cognitive Function:    03/26/2020   11:31 AM 06/30/2016    9:05 AM  MMSE - Mini Mental State Exam  Orientation to time 5 5  Orientation to Place 5 5  Registration 3 3  Attention/ Calculation 5 0  Recall 3 1  Recall-comments  pt was unable to recall 2 of 3 words  Language- name 2 objects  0  Language- repeat 1 1  Language- follow 3 step command  3  Language- read & follow direction  0  Write a sentence  0  Copy design  0  Total score  18        07/10/2023   10:25 AM 06/06/2022    8:59 AM  6CIT Screen  What Year? 0 points 0 points  What month? 0 points 0 points  What time? 0 points 0 points  Count back from 20 0 points 0 points  Months in reverse 0 points 0 points  Repeat phrase 0 points 0 points  Total Score 0 points 0 points    Immunizations Immunization History  Administered Date(s) Administered   Fluad Quad(high Dose 65+) 05/15/2019, 05/11/2020, 05/20/2021, 08/02/2022   Fluad Trivalent(High Dose 65+) 05/10/2023   Influenza Split 04/25/2011   Influenza Whole 08/01/2003, 04/20/2010    Influenza,inj,Quad PF,6+ Mos 05/15/2013, 04/20/2014, 06/30/2015, 07/06/2017   Influenza-Unspecified 05/14/2016   PFIZER(Purple Top)SARS-COV-2 Vaccination 09/22/2019, 10/13/2019   Pneumococcal Conjugate-13 08/18/2014   Pneumococcal Polysaccharide-23 09/06/2012   Td 11/17/2004   Tdap 07/04/2016   Zoster, Live 06/21/2010    TDAP status: Up to date  Flu Vaccine status: Up to date  Pneumococcal vaccine status: Up to date  Covid-19 vaccine status: Completed vaccines  Qualifies for Shingles Vaccine? Yes   Zostavax completed No   Shingrix Completed?: No.    Education has been provided regarding the importance of this vaccine. Patient has been advised to call insurance company to determine out of pocket expense if they have not yet received this vaccine. Advised may also receive vaccine at local pharmacy or Health Dept. Verbalized acceptance and understanding.  Screening Tests Health Maintenance  Topic Date Due   COVID-19 Vaccine (3 - Pfizer risk series) 03/09/2025 (Originally 11/10/2019)   Zoster Vaccines- Shingrix (1 of 2) 05/24/2026 (Originally 04/13/1965)   MAMMOGRAM  07/29/2023   Medicare Annual Wellness (AWV)  07/09/2024   DTaP/Tdap/Td (3 -  Td or Tdap) 07/04/2026   Pneumonia Vaccine 70+ Years old  Completed   INFLUENZA VACCINE  Completed   DEXA SCAN  Completed   Hepatitis C Screening  Completed   HPV VACCINES  Aged Out   Fecal DNA (Cologuard)  Discontinued    Health Maintenance  There are no preventive care reminders to display for this patient.   Colorectal cancer screening: No longer required. Cologard 03/08/23  Mammogram status: No longer required due to age.  Bone Density status: Completed 05/12/2021. Results reflect: Bone density results: NORMAL. Repeat every 5 years.  Lung Cancer Screening: (Low Dose CT Chest recommended if Age 46-80 years, 20 pack-year currently smoking OR have quit w/in 15years.) does not qualify.   Lung Cancer Screening Referral:  no  Additional Screening:  Hepatitis C Screening: does not qualify; Completed 06/30/2016  Vision Screening: Recommended annual ophthalmology exams for early detection of glaucoma and other disorders of the eye. Is the patient up to date with their annual eye exam?  Yes  Who is the provider or what is the name of the office in which the patient attends annual eye exams? Dr Jean Rosenthal If pt is not established with a provider, would they like to be referred to a provider to establish care? No .   Dental Screening: Recommended annual dental exams for proper oral hygiene  Diabetic Foot Exam: n/a  Community Resource Referral / Chronic Care Management: CRR required this visit?  No   CCM required this visit?  Appt scheduled with PCP    Plan:     I have personally reviewed and noted the following in the patient's chart:   Medical and social history Use of alcohol, tobacco or illicit drugs  Current medications and supplements including opioid prescriptions. Patient is not currently taking opioid prescriptions. Functional ability and status Nutritional status Physical activity Advanced directives List of other physicians Hospitalizations, surgeries, and ER visits in previous 12 months Vitals Screenings to include cognitive, depression, and falls Referrals and appointments  In addition, I have reviewed and discussed with patient certain preventive protocols, quality metrics, and best practice recommendations. A written personalized care plan for preventive services as well as general preventive health recommendations were provided to patient.    Sue Lush, LPN   84/13/2440   After Visit Summary: (MyChart) Due to this being a telephonic visit, the after visit summary with patients personalized plan was offered to patient via MyChart   Nurse Notes: The patient states she is doing well and has no concerns or questions at this time. She is recovering from a cold but is doing  better.

## 2023-07-30 ENCOUNTER — Ambulatory Visit
Admission: RE | Admit: 2023-07-30 | Discharge: 2023-07-30 | Disposition: A | Discharge status: Home / Self Care | Payer: Medicare Other | Source: Ambulatory Visit | Attending: Physician Assistant | Admitting: Physician Assistant

## 2023-07-30 DIAGNOSIS — Z853 Personal history of malignant neoplasm of breast: Secondary | ICD-10-CM

## 2023-07-30 DIAGNOSIS — Z1231 Encounter for screening mammogram for malignant neoplasm of breast: Secondary | ICD-10-CM | POA: Diagnosis not present

## 2023-08-19 NOTE — Progress Notes (Unsigned)
Cardiology Office Note:  .   Date:  08/20/2023  ID:  Jennifer Bartlett, DOB Jan 01, 1946, MRN [id number] PCP: Judy Pimple, MD  [facility] Providers Cardiologist:  None {  History of Present Illness: Jennifer Bartlett is a 78 y.o. female with a past medical history of aortic sclerosis and systolic murmur here for follow-up appointment.  History includes echocardiogram August 2024 that showed LVEF 70%, mild LVH, mildly to moderately dilated left atrium, trivial mitral regurgitation and moderate calcification of the aortic valve with mild regurgitation.  No evidence of stenosis.  Patient reportedly was feeling generally well but noted difficulty sleeping flat due to sensation of heavy pressure and difficulty breathing when he was seen back in November 2024.  She was sleeping in a semireclined chair due to this.  Patient also reported sensation of tightness in his legs from his feet to his knees.  Medical history includes multiple surgeries and postpone surgery for gallbladder and hernia due to diagnosis of pneumonia in the perioperative period.  Patient has family history of heart disease with both parents passing away from massive heart attack in their 66s.  Patient is also a non-smoker with a spouse who quit smoking 52 years ago.  She was on medications of clinical Thalitone 25 mg and potassium 10 mEq.  No new medications at the time of consultation.  Today, she tells me that she has had multiple surgeries postponed including gallbladder and hernia surgery.  Still having a lot of discomfort with her gallbladder and her umbilical hernia is bothersome.  Otherwise, she has been struggling with some mild lower extremity edema and we discussed using lower extremity compression as well as elevating feet.  There has been some confusion with her chlorthalidone.  When she was prescribed the spironolactone she thought that she could discontinue her chlorthalidone.  We discussed that this is a  different type of a diuretic and it would be best to continue both for her blood pressure and lower extremity edema.  Otherwise, stable from a cardiovascular standpoint.  Reports no shortness of breath nor dyspnea on exertion. Reports no chest pain, pressure, or tightness. No edema, orthopnea, PND. Reports no palpitations.    Discussed the use of AI scribe software for clinical note transcription with the patient, who gave verbal consent to proceed.  ROS: pertinent ROS in HPI  Studies Reviewed: .       Echo  IMPRESSIONS     1. Left ventricular ejection fraction, by estimation, is 65 to 70%. The  left ventricle has normal function. The left ventricle has no regional  wall motion abnormalities. There is mild concentric left ventricular  hypertrophy. Left ventricular diastolic  parameters are consistent with Grade I diastolic dysfunction (impaired  relaxation).   2. Right ventricular systolic function is normal. The right ventricular  size is normal.   3. Left atrial size was mild to moderately dilated.   4. The mitral valve is normal in structure. Trivial mitral valve  regurgitation. No evidence of mitral stenosis.   5. The aortic valve is tricuspid. There is moderate calcification of the  aortic valve. Aortic valve regurgitation is mild. Aortic valve  sclerosis/calcification is present, without any evidence of aortic  stenosis. Aortic regurgitation PHT measures 332  msec.   6. The inferior vena cava is normal in size with greater than 50%  respiratory variability, suggesting right atrial pressure of 3 mmHg.   FINDINGS   Left Ventricle: Left ventricular ejection fraction,  by estimation, is 65  to 70%. The left ventricle has normal function. The left ventricle has no  regional wall motion abnormalities. The left ventricular internal cavity  size was normal in size. There is   mild concentric left ventricular hypertrophy. Left ventricular diastolic  parameters are consistent with  Grade I diastolic dysfunction (impaired  relaxation).   Right Ventricle: The right ventricular size is normal. No increase in  right ventricular wall thickness. Right ventricular systolic function is  normal.   Left Atrium: Left atrial size was mild to moderately dilated.   Right Atrium: Right atrial size was normal in size.   Pericardium: There is no evidence of pericardial effusion.   Mitral Valve: The mitral valve is normal in structure. Trivial mitral  valve regurgitation. No evidence of mitral valve stenosis.   Tricuspid Valve: The tricuspid valve is normal in structure. Tricuspid  valve regurgitation is trivial. No evidence of tricuspid stenosis.   Aortic Valve: The aortic valve is tricuspid. There is moderate  calcification of the aortic valve. Aortic valve regurgitation is mild.  Aortic regurgitation PHT measures 332 msec. Aortic valve  sclerosis/calcification is present, without any evidence of  aortic stenosis.   Pulmonic Valve: The pulmonic valve was normal in structure. Pulmonic valve  regurgitation is trivial. No evidence of pulmonic stenosis.   Aorta: The aortic root is normal in size and structure.   Venous: The inferior vena cava is normal in size with greater than 50%  respiratory variability, suggesting right atrial pressure of 3 mmHg.   IAS/Shunts: No atrial level shunt detected by color flow Doppler.         Physical Exam:   VS:  BP (!) 180/100   Pulse 94   Ht 5' 4.5" (1.638 m)   Wt 211 lb 9.6 oz (96 kg)   LMP 06/08/1989   SpO2 96%   BMI 35.76 kg/m    Wt Readings from Last 3 Encounters:  08/20/23 211 lb 9.6 oz (96 kg)  07/10/23 217 lb (98.4 kg)  06/14/23 217 lb (98.4 kg)    GEN: Well nourished, well developed in no acute distress NECK: No JVD; No carotid bruits CARDIAC: RRR, no murmurs, rubs, gallops RESPIRATORY:  Clear to auscultation without rales, wheezing or rhonchi  ABDOMEN: Soft, non-tender, non-distended EXTREMITIES:  No edema; No  deformity   ASSESSMENT AND PLAN: .    Hypertension Elevated blood pressure in the office, but patient reports it is usually high initially and then comes down. -Advise patient to monitor blood pressure at home for one week, an hour after morning medications, and report values.  Peripheral Edema Patient reports swelling in ankles and has visible edema. Patient has not been consistently taking both prescribed diuretics. -Advise patient to consistently take both Chlorthalidone 25mg  daily and Spironolactone 25mg  (half tablet) daily. -Recommend elevation of legs and use of compression stockings.  Hyperlipidemia LDL was elevated on last check in the summer. -Continue Zetia and follow up with primary care provider for recheck of lipid panel.  Gallbladder Disease Patient reports pain in the area of the gallbladder and was scheduled for gallbladder removal but surgery was postponed due to pneumonia and CLL diagnosis. -Recommend discussion with primary care provider and surgeon regarding rescheduling of gallbladder surgery.  Hernia Patient reports a hernia in the navel area that is causing discomfort. -Recommend discussion with primary care provider and surgeon regarding possible surgical intervention.  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 -No specific plan discussed in  this visit, continue current management as directed by oncologist.  Physical Deconditioning Patient reports decreased physical activity due to recent health issues. -Encourage patient to gradually increase physical activity as tolerated, starting with walking once or twice a week.  General Health Maintenance / Followup Plans -Follow up with primary care provider in two months for routine physical. -Check blood pressure at home for one week and report values. -Continue current medications, including consistent use of both diuretics for edema. -Discuss gallbladder surgery and hernia  intervention with primary care provider and surgeon. -Continue Zetia for hyperlipidemia and follow up with primary care provider for recheck of lipid panel.     Dispo: She can follow-up in 6 months with Dr. Anne Fu  Signed, Sharlene Dory, PA-C

## 2023-08-20 ENCOUNTER — Encounter: Payer: Self-pay | Admitting: Physician Assistant

## 2023-08-20 ENCOUNTER — Ambulatory Visit: Payer: Medicare Other | Attending: Physician Assistant | Admitting: Physician Assistant

## 2023-08-20 VITALS — BP 180/100 | HR 94 | Ht 64.5 in | Wt 211.6 lb

## 2023-08-20 DIAGNOSIS — Z79899 Other long term (current) drug therapy: Secondary | ICD-10-CM | POA: Diagnosis not present

## 2023-08-20 DIAGNOSIS — I358 Other nonrheumatic aortic valve disorders: Secondary | ICD-10-CM

## 2023-08-20 DIAGNOSIS — R600 Localized edema: Secondary | ICD-10-CM

## 2023-08-20 DIAGNOSIS — I10 Essential (primary) hypertension: Secondary | ICD-10-CM | POA: Diagnosis not present

## 2023-08-20 DIAGNOSIS — E785 Hyperlipidemia, unspecified: Secondary | ICD-10-CM

## 2023-08-20 NOTE — Patient Instructions (Signed)
Medication Instructions:    Your physician recommends that you continue on your current medications as directed. Please refer to the Current Medication list given to you today.   *If you need a refill on your cardiac medications before your next appointment, please call your pharmacy*   Lab Work:  NONE ORDERED  TODAY    If you have labs (blood work) drawn today and your tests are completely normal, you will receive your results only by: MyChart Message (if you have MyChart) OR A paper copy in the mail If you have any lab test that is abnormal or we need to change your treatment, we will call you to review the results.   Testing/Procedures:  NONE ORDERED  TODAY     Follow-Up: At [facility], you and your health needs are our priority.  As part of our continuing mission to provide you with exceptional heart care, we have created designated Provider Care Teams.  These Care Teams include your primary Cardiologist (physician) and Advanced Practice Providers (APPs -  Physician Assistants and Nurse Practitioners) who all work together to provide you with the care you need, when you need it.  We recommend signing up for the patient portal called "MyChart".  Sign up information is provided on this After Visit Summary.  MyChart is used to connect with patients for Virtual Visits (Telemedicine).  Patients are able to view lab/test results, encounter notes, upcoming appointments, etc.  Non-urgent messages can be sent to your provider as well.   To learn more about what you can do with MyChart, go to [email or web address].    Your next appointment:   6 month(s)  Provider:   Dr. Anne Fu    Other Instructions  Keep Blood pressure log for one week and send in to MyChart of call and leave a message with readings.

## 2023-09-02 ENCOUNTER — Other Ambulatory Visit: Payer: Self-pay | Admitting: Internal Medicine

## 2023-09-06 ENCOUNTER — Other Ambulatory Visit: Payer: Self-pay | Admitting: Physician Assistant

## 2023-09-06 DIAGNOSIS — D509 Iron deficiency anemia, unspecified: Secondary | ICD-10-CM

## 2023-09-06 DIAGNOSIS — C8300 Small cell B-cell lymphoma, unspecified site: Secondary | ICD-10-CM

## 2023-09-07 ENCOUNTER — Inpatient Hospital Stay (HOSPITAL_BASED_OUTPATIENT_CLINIC_OR_DEPARTMENT_OTHER): Payer: Medicare Other | Admitting: Physician Assistant

## 2023-09-07 ENCOUNTER — Inpatient Hospital Stay: Payer: Medicare Other | Attending: Internal Medicine

## 2023-09-07 DIAGNOSIS — D509 Iron deficiency anemia, unspecified: Secondary | ICD-10-CM

## 2023-09-07 DIAGNOSIS — Z79899 Other long term (current) drug therapy: Secondary | ICD-10-CM | POA: Diagnosis not present

## 2023-09-07 DIAGNOSIS — C8300 Small cell B-cell lymphoma, unspecified site: Secondary | ICD-10-CM

## 2023-09-07 DIAGNOSIS — C9110 Chronic lymphocytic leukemia of B-cell type not having achieved remission: Secondary | ICD-10-CM | POA: Insufficient documentation

## 2023-09-07 DIAGNOSIS — Z853 Personal history of malignant neoplasm of breast: Secondary | ICD-10-CM

## 2023-09-07 LAB — CBC WITH DIFFERENTIAL (CANCER CENTER ONLY)
Abs Immature Granulocytes: 0.02 10*3/uL (ref 0.00–0.07)
Basophils Absolute: 0.1 10*3/uL (ref 0.0–0.1)
Basophils Relative: 1 %
Eosinophils Absolute: 0.2 10*3/uL (ref 0.0–0.5)
Eosinophils Relative: 3 %
HCT: 41.6 % (ref 36.0–46.0)
Hemoglobin: 13.5 g/dL (ref 12.0–15.0)
Immature Granulocytes: 0 %
Lymphocytes Relative: 18 %
Lymphs Abs: 1.3 10*3/uL (ref 0.7–4.0)
MCH: 29.5 pg (ref 26.0–34.0)
MCHC: 32.5 g/dL (ref 30.0–36.0)
MCV: 91 fL (ref 80.0–100.0)
Monocytes Absolute: 0.5 10*3/uL (ref 0.1–1.0)
Monocytes Relative: 7 %
Neutro Abs: 5.2 10*3/uL (ref 1.7–7.7)
Neutrophils Relative %: 71 %
Platelet Count: 304 10*3/uL (ref 150–400)
RBC: 4.57 MIL/uL (ref 3.87–5.11)
RDW: 13.5 % (ref 11.5–15.5)
WBC Count: 7.3 10*3/uL (ref 4.0–10.5)
nRBC: 0 % (ref 0.0–0.2)

## 2023-09-07 LAB — CMP (CANCER CENTER ONLY)
ALT: 7 U/L (ref 0–44)
AST: 14 U/L — ABNORMAL LOW (ref 15–41)
Albumin: 4.3 g/dL (ref 3.5–5.0)
Alkaline Phosphatase: 96 U/L (ref 38–126)
Anion gap: 7 (ref 5–15)
BUN: 20 mg/dL (ref 8–23)
CO2: 32 mmol/L (ref 22–32)
Calcium: 9.6 mg/dL (ref 8.9–10.3)
Chloride: 97 mmol/L — ABNORMAL LOW (ref 98–111)
Creatinine: 0.98 mg/dL (ref 0.44–1.00)
GFR, Estimated: 60 mL/min — ABNORMAL LOW (ref >60)
Glucose, Bld: 106 mg/dL — ABNORMAL HIGH (ref 70–99)
Potassium: 3.6 mmol/L (ref 3.5–5.1)
Sodium: 136 mmol/L (ref 135–145)
Total Bilirubin: 0.5 mg/dL (ref 0.0–1.2)
Total Protein: 7.9 g/dL (ref 6.5–8.1)

## 2023-09-07 LAB — IRON AND IRON BINDING CAPACITY (CC-WL,HP ONLY)
Iron: 81 ug/dL (ref 28–170)
Saturation Ratios: 20 % (ref 10.4–31.8)
TIBC: 413 ug/dL (ref 250–450)
UIBC: 332 ug/dL (ref 148–442)

## 2023-09-07 LAB — FERRITIN: Ferritin: 40 ng/mL (ref 11–307)

## 2023-09-07 LAB — LACTATE DEHYDROGENASE: LDH: 149 U/L (ref 98–192)

## 2023-09-07 NOTE — Progress Notes (Signed)
 [facility] [phone]   [phone]  PROGRESS NOTE  Patient Care Team: Tower, Laine LABOR, MD as PCP - General Neysa Inocente PARAS, NP (Inactive) as Nurse Practitioner (Obstetrics and Gynecology) Aron Shoulders, MD as Consulting Physician (General Surgery) Glean Stephane BROCKS, RN as Registered Nurse Tyree Nanetta SAILOR, RN as Registered Nurse Moses, Powell Hummer, NP as Nurse Practitioner (Nurse Practitioner) Rubie Kemps, MD as Consulting Physician (Orthopedic Surgery)  CHIEF COMPLAINTS/PURPOSE OF CONSULTATION:  -Iron  deficiency anemia -History of right breast cancer -Left axillary lymphadenopathy--biopsy-proven CLL/SLL  HISTORY OF PRESENTING ILLNESS:  Jennifer Bartlett 78 y.o. female returns for a follow up for CLL/SLL and iron  deficiency anemia. She was last seen on 03/02/2023. She is unaccompanied for this visit.   On exam today, Jennifer Bartlett denies any major changes in her health in the interim since her last visit. She reports she has intentionally lost another 10-12 lbs due to diet modifications and exercise. Her energy is fairly stable. She reports the palpable lymph nodes at the back of her neck are stable. She denies any nausea, vomiting or bowel habit changes. She denies easy bruising or signs of active bleeding.  She denies fevers, chills, sweats, shortness of breath, chest pain or cough. She has no other complaints. Rest of the 10 point ROS is below.   MEDICAL HISTORY:  Past Medical History:  Diagnosis Date   Anemia    Anxiety    Arthritis    hands, knees   Breast cancer (HCC)    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   Depression    Family history of adverse reaction to anesthesia    mother has had PONV   GERD (gastroesophageal reflux disease)    Hot flashes    Hyperlipidemia    Hypertension    Hypoglycemia    Hypothyroidism    Obesity    Personal history of radiation therapy    Vitamin B 12  deficiency     SURGICAL HISTORY: Past Surgical History:  Procedure Laterality Date   ABDOMINAL HYSTERECTOMY     BREAST BIOPSY     BREAST LUMPECTOMY Right    2016   BREAST REDUCTION SURGERY Bilateral 09/14/2020   Procedure: MAMMARY REDUCTION  (BREAST);  Surgeon: Arelia Filippo, MD;  Location: [facility];  Service: Plastics;  Laterality: Bilateral;   GASTRIC BYPASS  08/2003   KNEE SURGERY     left   PARTIAL HYSTERECTOMY  1990's   bleeding   RADIOACTIVE SEED GUIDED PARTIAL MASTECTOMY WITH AXILLARY SENTINEL LYMPH NODE BIOPSY Right 03/09/2015   Procedure: RADIOACTIVE SEED GUIDED RIGHT PARTIAL MASTECTOMY WITH AXILLARY SENTINEL LYMPH NODE BIOPSY;  Surgeon: Shoulders Aron, MD;  Location: [facility];  Service: General;  Laterality: Right;   REDUCTION MAMMAPLASTY Bilateral 08/2020   TUBAL LIGATION      SOCIAL HISTORY: Social History   Socioeconomic History   Marital status: Married    Spouse name: Not on file   Number of children: 3   Years of education: Not on file   Highest education level: Not on file  Occupational History    Employer: [employer]  Tobacco Use   Smoking status: Never   Smokeless tobacco: Never  Vaping Use   Vaping status: Never Used  Substance and Sexual Activity   Alcohol use: No   Drug use: No   Sexual activity: Yes    Partners: Male    Birth control/protection: Surgical    Comment:  Hyst.  Other Topics Concern   Not on file  Social History Narrative   Regular walking for exercise.   Social Drivers of Corporate Investment Banker Strain: Low Risk  (07/10/2023)   Overall Financial Resource Strain (CARDIA)    Difficulty of Paying Living Expenses: Not hard at all  Food Insecurity: No Food Insecurity (07/10/2023)   Hunger Vital Sign    Worried About Running Out of Food in the Last Year: Never true    Ran Out of Food in the Last Year: Never true  Transportation Needs: No Transportation Needs (07/10/2023)    PRAPARE - Administrator, Civil Service (Medical): No    Lack of Transportation (Non-Medical): No  Physical Activity: Sufficiently Active (07/10/2023)   Exercise Vital Sign    Days of Exercise per Week: 3 days    Minutes of Exercise per Session: 60 min  Stress: No Stress Concern Present (07/10/2023)   [employer] of Occupational Health - Occupational Stress Questionnaire    Feeling of Stress : Not at all  Social Connections: Socially Integrated (07/10/2023)   Social Connection and Isolation Panel [NHANES]    Frequency of Communication with Friends and Family: More than three times a week    Frequency of Social Gatherings with Friends and Family: More than three times a week    Attends Religious Services: More than 4 times per year    Active Member of [employer] or Organizations: Yes    Attends Engineer, Structural: More than 4 times per year    Marital Status: Married  Catering Manager Violence: Not At Risk (07/10/2023)   Humiliation, Afraid, Rape, and Kick questionnaire    Fear of Current or Ex-Partner: No    Emotionally Abused: No    Physically Abused: No    Sexually Abused: No    FAMILY HISTORY: Family History  Problem Relation Age of Onset   Heart disease Mother        MI   Hypertension Mother    Alcohol abuse Father    Heart disease Father        smoker   Hypertension Father    Breast cancer Paternal Grandmother    Diabetes Maternal Aunt     ALLERGIES:  is allergic to crestor  [rosuvastatin  calcium ], lipitor [atorvastatin calcium ], penicillins, zocor [simvastatin - high dose], and ferrex 150 [polysaccharide iron  complex].  MEDICATIONS:  Current Outpatient Medications  Medication Sig Dispense Refill   acetaminophen  (TYLENOL ) 500 MG tablet Take 1,000 mg by mouth every 6 (six) hours as needed for moderate pain.     ALPRAZolam  (XANAX ) 0.5 MG tablet 1 pill by mouth twice daily as needed for anxiety before procedure 6 tablet 0   Biotin 10 MG TABS Take  10 mg by mouth in the morning and at bedtime.     CALCIUM  PO Take 1 tablet by mouth daily.     chlorthalidone  (HYGROTON ) 25 MG tablet Take 1 tablet by mouth once daily 90 tablet 2   Cholecalciferol (D3 VITAMIN PO) Take 4,000 Units by mouth daily.     diclofenac Sodium (VOLTAREN) 1 % GEL Apply 2 g topically 4 (four) times daily as needed (pain).     ezetimibe  (ZETIA ) 10 MG tablet Take 1 tablet (10 mg total) by mouth daily. 90 tablet 3   ketoconazole  (NIZORAL ) 2 % cream APPLY TOPICALLY DAILY (Patient taking differently: 1 Application daily as needed (irritation under breast).) 15 g 0   levothyroxine  (SYNTHROID ) 75 MCG tablet TAKE 1  TABLET BY MOUTH ONCE DAILY BEFORE BREAKFAST 90 tablet 3   loratadine (CLARITIN) 10 MG tablet Take 10 mg by mouth daily as needed for allergies.     Multiple Vitamin (MULTIVITAMIN) capsule Take 1 capsule by mouth daily.     naproxen sodium (ALEVE) 220 MG tablet Take 220 mg by mouth daily as needed (pain).     omeprazole  (PRILOSEC) 20 MG capsule TAKE 1 CAPSULE BY MOUTH TWICE DAILY BEFORE A MEAL 180 capsule 1   OVER THE COUNTER MEDICATION Take 1 capsule by mouth daily. InstaFlex     spironolactone  (ALDACTONE ) 25 MG tablet Take 0.5 tablets (12.5 mg total) by mouth daily. 45 tablet 3   No current facility-administered medications for this visit.    REVIEW OF SYSTEMS:   Constitutional: ( - ) fevers, ( - )  chills , ( - ) night sweats Eyes: ( - ) blurriness of vision, ( - ) double vision, ( - ) watery eyes Ears, nose, mouth, throat, and face: ( - ) mucositis, ( - ) sore throat Respiratory: ( - ) cough, (- ) dyspnea, ( - ) wheezes Cardiovascular: ( - ) palpitation, ( - ) chest discomfort, ( - ) lower extremity swelling Gastrointestinal:  ( - ) nausea, ( - ) heartburn, ( - ) change in bowel habits Skin: ( - ) abnormal skin rashes Lymphatics: ( + ) new lymphadenopathy, ( - ) easy bruising Neurological: ( - ) numbness, ( - ) tingling, ( - ) new weaknesses Behavioral/Psych:  ( - ) mood change, ( - ) new changes  All other systems were reviewed with the patient and are negative.  PHYSICAL EXAMINATION: ECOG PERFORMANCE STATUS: 1 - Symptomatic but completely ambulatory  There were no vitals filed for this visit.   There were no vitals filed for this visit.   GENERAL: well appearing female in NAD  SKIN: skin color, texture, turgor are normal, no rashes or significant lesions EYES: conjunctiva are pink and non-injected, sclera clear LYMPH:  no palpable lymphadenopathy in the cervical, supraclavicular lymph nodes. Subcentimeter lymph nodes appreciated posterior cervical region bilaterally. LUNGS: clear to auscultation and percussion with normal breathing effort HEART: regular rate & rhythm and no murmurs and no lower extremity edema Musculoskeletal: no cyanosis of digits and no clubbing  PSYCH: alert & oriented x 3, fluent speech NEURO: no focal motor/sensory deficits  LABORATORY DATA:  I have reviewed the data as listed    Latest Ref Rng & Units 09/07/2023    9:20 AM 03/02/2023   10:06 AM 02/13/2023    8:14 AM  CBC  WBC 4.0 - 10.5 K/uL 7.3  7.1  6.2   Hemoglobin 12.0 - 15.0 g/dL 86.4  86.9  87.1   Hematocrit 36.0 - 46.0 % 41.6  38.5  39.4   Platelets 150 - 400 K/uL 304  316  289.0        Latest Ref Rng & Units 09/07/2023    9:20 AM 07/02/2023    8:55 AM 03/02/2023   10:06 AM  CMP  Glucose 70 - 99 mg/dL 893  897  894   BUN 8 - 23 mg/dL 20  17  24    Creatinine 0.44 - 1.00 mg/dL 9.01  9.13  9.05   Sodium 135 - 145 mmol/L 136  142  137   Potassium 3.5 - 5.1 mmol/L 3.6  4.1  3.8   Chloride 98 - 111 mmol/L 97  102  100   CO2 22 - 32 mmol/L  32  27  29   Calcium  8.9 - 10.3 mg/dL 9.6  9.3  9.1   Total Protein 6.5 - 8.1 g/dL 7.9   7.3   Total Bilirubin 0.0 - 1.2 mg/dL 0.5   0.5   Alkaline Phos 38 - 126 U/L 96   93   AST 15 - 41 U/L 14   17   ALT 0 - 44 U/L 7   14     ASSESSMENT & PLAN SHIRLEYMAE HAUTH is a 78 y.o. female returns for  follow-up.   #Iron  deficiency anemia: --Secondary to malasorbption due to bariatric surgery --Placed referral to GI but consultation never took place. Consider re-referral if iron  deficiency reoccurs.  --Received IV feraheme  510 mg x 2 doses on 01/18/2022 and 01/25/2022.  --Labs show no evidence of anemia with Hgb 13.5, MCV 91.0. Iron  levels show iron  81, TIBC 413, saturation 20%. Ferritin levels pending.   --No need for additional IV iron  infusions at this time.   #H/O bariatric surgery: --B12 levels normal from 02/13/2023.  --Recheck periodically, every 6 months.   #H/O right breast cancer: --Medical oncologist was Dr. Layla. Completed treatment in October 2016 and completed 5 years of Tamoxifen  therapy in January 2022.  --Currently on surveillance with PCP.  --Last mammogram was 07/30/2023 which was negative for malignancy. Next due in one year.   #Left breast mass and left axillary lymphadenopathy: # CLL Rai Stage II  --CT chest from 04/07/2022 showed numerous enlarged and nonenlarged left axillary lymph nodes measuring up to 16 mm short axis --Physical exam findings were concerning for palpable mass in the outer lower quadrant of the left breast.  -- left axillary US  with biopsy on 04/28/2023 confirmed CLL/SLL -- No indication to start treatment at this time with no evidence of cytopenias or B-symptoms.   Follow up: --6 months: labs and follow up  No orders of the defined types were placed in this encounter.   All questions were answered. The patient knows to call the clinic with any problems, questions or concerns.  I have spent a total of 30 minutes minutes of face-to-face and non-face-to-face time, preparing to see the patient, performing a medically appropriate examination, counseling and educating the patient, ordering medications/tests/procedures, documenting clinical information in the electronic health record, independently interpreting results and communicating results  to the patient, and care coordination.   Johnston Police PA-C Dept of Hematology and Oncology [facility] Phone: [phone]  Shermon HERO, [place] BD, Jane Lew D, Caligaris-Cappio F, Dighiero G, Dhner H, Hillmen P, [place], Montserrat E, Chiorazzi N, [place], Rai KR, [place], Eichhorst B, O'Brien S, Robak T, Seymour JF, Webster UG. iwCLL guidelines for diagnosis, indications for treatment, response assessment, and supportive management of CLL. Blood. 2018 Jun 21;131(25):2745-2760.  Active disease should be clearly documented to initiate therapy. At least 1 of the following criteria should be met.  1) Evidence of progressive marrow failure as manifested by the development of, or worsening of, anemia and/or thrombocytopenia. Cutoff levels of Hb <10 g/dL or platelet counts <899  109/L are generally regarded as indication for treatment. However, in some patients, platelet counts <100  109/L may remain stable over a long period; this situation does not automatically require therapeutic intervention. 2) Massive (ie, >=6 cm below the left costal margin) or progressive or symptomatic splenomegaly. 3) Massive nodes (ie, >=10 cm in longest diameter) or progressive or symptomatic lymphadenopathy. 4) Progressive lymphocytosis with an increase of >=50%  over a 13-month period, or lymphocyte doubling time (LDT) <6 months. LDT can be obtained by linear regression extrapolation of absolute lymphocyte counts obtained at intervals of 2 weeks over an observation period of 2 to 3 months; patients with initial blood lymphocyte counts <30  109/L may require a longer observation period to determine the LDT. Factors contributing to lymphocytosis other than CLL (eg, infections, steroid administration) should be excluded. 5) Autoimmune complications including anemia or thrombocytopenia poorly responsive to corticosteroids. 6) Symptomatic or functional extranodal involvement (eg, skin,  kidney, lung, spine). Disease-related symptoms as defined by any of the following: Unintentional weight loss >=10% within the previous 6 months. Significant fatigue (ie, ECOG performance scale 2 or worse; cannot work or unable to perform usual activities). Fevers >=100.24F or 38.0C for 2 or more weeks without evidence of infection. Night sweats for >=1 month without evidence of infection.

## 2024-02-13 ENCOUNTER — Other Ambulatory Visit: Payer: Self-pay | Admitting: Family Medicine

## 2024-02-17 ENCOUNTER — Telehealth: Payer: Self-pay | Admitting: Family Medicine

## 2024-02-17 DIAGNOSIS — R7303 Prediabetes: Secondary | ICD-10-CM

## 2024-02-17 DIAGNOSIS — I10 Essential (primary) hypertension: Secondary | ICD-10-CM

## 2024-02-17 DIAGNOSIS — E7800 Pure hypercholesterolemia, unspecified: Secondary | ICD-10-CM

## 2024-02-17 DIAGNOSIS — E039 Hypothyroidism, unspecified: Secondary | ICD-10-CM

## 2024-02-17 DIAGNOSIS — E559 Vitamin D deficiency, unspecified: Secondary | ICD-10-CM

## 2024-02-17 DIAGNOSIS — E538 Deficiency of other specified B group vitamins: Secondary | ICD-10-CM

## 2024-02-17 DIAGNOSIS — Z79899 Other long term (current) drug therapy: Secondary | ICD-10-CM

## 2024-02-17 NOTE — Telephone Encounter (Signed)
-----   Message from Veva JINNY Ferrari sent at 01/29/2024  3:35 PM EDT ----- Regarding: Lab orders for Mon, 7.21.25 Patient is scheduled for CPX labs, please order future labs, Thanks , Veva

## 2024-02-18 ENCOUNTER — Other Ambulatory Visit (INDEPENDENT_AMBULATORY_CARE_PROVIDER_SITE_OTHER): Payer: Medicare Other

## 2024-02-18 ENCOUNTER — Ambulatory Visit: Payer: Self-pay | Admitting: Family Medicine

## 2024-02-18 DIAGNOSIS — E7800 Pure hypercholesterolemia, unspecified: Secondary | ICD-10-CM | POA: Diagnosis not present

## 2024-02-18 DIAGNOSIS — R7303 Prediabetes: Secondary | ICD-10-CM

## 2024-02-18 DIAGNOSIS — E039 Hypothyroidism, unspecified: Secondary | ICD-10-CM

## 2024-02-18 DIAGNOSIS — E559 Vitamin D deficiency, unspecified: Secondary | ICD-10-CM | POA: Diagnosis not present

## 2024-02-18 DIAGNOSIS — I10 Essential (primary) hypertension: Secondary | ICD-10-CM | POA: Diagnosis not present

## 2024-02-18 DIAGNOSIS — E538 Deficiency of other specified B group vitamins: Secondary | ICD-10-CM

## 2024-02-18 LAB — CBC WITH DIFFERENTIAL/PLATELET
Basophils Absolute: 0.1 K/uL (ref 0.0–0.1)
Basophils Relative: 1.4 % (ref 0.0–3.0)
Eosinophils Absolute: 0.2 K/uL (ref 0.0–0.7)
Eosinophils Relative: 3.3 % (ref 0.0–5.0)
HCT: 39.3 % (ref 36.0–46.0)
Hemoglobin: 12.7 g/dL (ref 12.0–15.0)
Lymphocytes Relative: 25.1 % (ref 12.0–46.0)
Lymphs Abs: 1.6 K/uL (ref 0.7–4.0)
MCHC: 32.3 g/dL (ref 30.0–36.0)
MCV: 92 fl (ref 78.0–100.0)
Monocytes Absolute: 0.5 K/uL (ref 0.1–1.0)
Monocytes Relative: 8.2 % (ref 3.0–12.0)
Neutro Abs: 3.8 K/uL (ref 1.4–7.7)
Neutrophils Relative %: 62 % (ref 43.0–77.0)
Platelets: 345 K/uL (ref 150.0–400.0)
RBC: 4.27 Mil/uL (ref 3.87–5.11)
RDW: 14.5 % (ref 11.5–15.5)
WBC: 6.2 K/uL (ref 4.0–10.5)

## 2024-02-18 LAB — LIPID PANEL
Cholesterol: 249 mg/dL — ABNORMAL HIGH (ref 0–200)
HDL: 46 mg/dL (ref >39.00)
LDL Cholesterol: 179 mg/dL — ABNORMAL HIGH (ref 0–99)
NonHDL: 203.48
Total CHOL/HDL Ratio: 5
Triglycerides: 124 mg/dL (ref 0.0–149.0)
VLDL: 24.8 mg/dL (ref 0.0–40.0)

## 2024-02-18 LAB — COMPREHENSIVE METABOLIC PANEL WITH GFR
ALT: 12 U/L (ref 0–35)
AST: 16 U/L (ref 0–37)
Albumin: 4.3 g/dL (ref 3.5–5.2)
Alkaline Phosphatase: 77 U/L (ref 39–117)
BUN: 22 mg/dL (ref 6–23)
CO2: 30 meq/L (ref 19–32)
Calcium: 9.4 mg/dL (ref 8.4–10.5)
Chloride: 99 meq/L (ref 96–112)
Creatinine, Ser: 0.94 mg/dL (ref 0.40–1.20)
GFR: 58.8 mL/min — ABNORMAL LOW (ref >60.00)
Glucose, Bld: 102 mg/dL — ABNORMAL HIGH (ref 70–99)
Potassium: 4.4 meq/L (ref 3.5–5.1)
Sodium: 138 meq/L (ref 135–145)
Total Bilirubin: 0.4 mg/dL (ref 0.2–1.2)
Total Protein: 6.9 g/dL (ref 6.0–8.3)

## 2024-02-18 LAB — VITAMIN B12: Vitamin B-12: 337 pg/mL (ref 211–911)

## 2024-02-18 LAB — VITAMIN D 25 HYDROXY (VIT D DEFICIENCY, FRACTURES): VITD: 45.83 ng/mL (ref 30.00–100.00)

## 2024-02-18 LAB — HEMOGLOBIN A1C: Hgb A1c MFr Bld: 6.1 % (ref 4.6–6.5)

## 2024-02-18 LAB — TSH: TSH: 3.93 u[IU]/mL (ref 0.35–5.50)

## 2024-02-25 ENCOUNTER — Ambulatory Visit (INDEPENDENT_AMBULATORY_CARE_PROVIDER_SITE_OTHER): Payer: Medicare Other | Admitting: Family Medicine

## 2024-02-25 ENCOUNTER — Encounter: Payer: Self-pay | Admitting: Family Medicine

## 2024-02-25 VITALS — BP 138/72 | HR 96 | Temp 97.7°F | Ht 62.5 in | Wt 204.0 lb

## 2024-02-25 DIAGNOSIS — C9110 Chronic lymphocytic leukemia of B-cell type not having achieved remission: Secondary | ICD-10-CM

## 2024-02-25 DIAGNOSIS — Z0000 Encounter for general adult medical examination without abnormal findings: Secondary | ICD-10-CM

## 2024-02-25 DIAGNOSIS — I10 Essential (primary) hypertension: Secondary | ICD-10-CM

## 2024-02-25 DIAGNOSIS — Z1211 Encounter for screening for malignant neoplasm of colon: Secondary | ICD-10-CM | POA: Diagnosis not present

## 2024-02-25 DIAGNOSIS — E039 Hypothyroidism, unspecified: Secondary | ICD-10-CM | POA: Diagnosis not present

## 2024-02-25 DIAGNOSIS — K219 Gastro-esophageal reflux disease without esophagitis: Secondary | ICD-10-CM

## 2024-02-25 DIAGNOSIS — E538 Deficiency of other specified B group vitamins: Secondary | ICD-10-CM | POA: Diagnosis not present

## 2024-02-25 DIAGNOSIS — R7303 Prediabetes: Secondary | ICD-10-CM

## 2024-02-25 DIAGNOSIS — E559 Vitamin D deficiency, unspecified: Secondary | ICD-10-CM

## 2024-02-25 DIAGNOSIS — Z79899 Other long term (current) drug therapy: Secondary | ICD-10-CM | POA: Diagnosis not present

## 2024-02-25 DIAGNOSIS — E7800 Pure hypercholesterolemia, unspecified: Secondary | ICD-10-CM | POA: Diagnosis not present

## 2024-02-25 NOTE — Assessment & Plan Note (Signed)
 Lab Results  Component Value Date   HGBA1C 6.1 02/18/2024   HGBA1C 6.0 02/13/2023   HGBA1C 6.3 12/01/2021   disc imp of low glycemic diet and wt loss to prevent DM2  Commended on weight loss

## 2024-02-25 NOTE — Assessment & Plan Note (Signed)
 Lab Results  Component Value Date   VITAMINB12 337 02/18/2024   Continues to watch Last vitamin D  Lab Results  Component Value Date   VD25OH 45.83 02/18/2024

## 2024-02-25 NOTE — Assessment & Plan Note (Signed)
 Last vitamin D  Lab Results  Component Value Date   VD25OH 45.83 02/18/2024   Vitamin D  level is therapeutic with current supplementation Disc importance of this to bone and overall health

## 2024-02-25 NOTE — Assessment & Plan Note (Signed)
 Lab Results  Component Value Date   VITAMINB12 337 02/18/2024   Continues heme input

## 2024-02-25 NOTE — Assessment & Plan Note (Signed)
 Reviewed health habits including diet and exercise and skin cancer prevention Reviewed appropriate screening tests for age  Also reviewed health mt list, fam hx and immunization status , as well as social and family history   See HPI Labs reviewed and ordered Health Maintenance  Topic Date Due   COVID-19 Vaccine (3 - Pfizer risk series) 03/09/2025*   Zoster (Shingles) Vaccine (1 of 2) 05/24/2026*   Flu Shot  02/29/2024   Medicare Annual Wellness Visit  07/09/2024   Mammogram  07/29/2024   DTaP/Tdap/Td vaccine (3 - Td or Tdap) 07/04/2026   Pneumococcal Vaccine for age over 49  Completed   DEXA scan (bone density measurement)  Completed   Hepatitis C Screening  Completed   Hepatitis B Vaccine  Aged Out   HPV Vaccine  Aged Out   Meningitis B Vaccine  Aged Out   Cologuard (Stool DNA test)  Discontinued  *Topic was postponed. The date shown is not the original due date.    Plans to get shingrix at pharmacy  Cologuard neg 03/2023  Discussed fall prevention, supplements and exercise for bone density  No falls or fracture  PHQ 0

## 2024-02-25 NOTE — Assessment & Plan Note (Signed)
 bp in fair control at this time  BP Readings from Last 1 Encounters:  02/25/24 138/72   No changes needed Most recent labs reviewed  Disc lifstyle change with low sodium diet and exercise  Chlorthalidone  25 mg daily  Spironolactone  12.5 mg daily

## 2024-02-25 NOTE — Progress Notes (Signed)
 Subjective:    Patient ID: Jennifer Bartlett, female    DOB: 1946/04/07, 78 y.o.   MRN: [id number]  HPI  Here for health maintenance exam and to review chronic medical problems   Wt Readings from Last 3 Encounters:  02/25/24 204 lb (92.5 kg)  08/20/23 211 lb 9.6 oz (96 kg)  07/10/23 217 lb (98.4 kg)   36.72 kg/m  Vitals:   02/25/24 1046  BP: 138/72  Pulse: 96  Temp: 97.7 F (36.5 C)  SpO2: 99%   Working on weight loss  Appetite goes down with age   Drinks water Occational tea Less coffee than in the past  Immunization History  Administered Date(s) Administered   Fluad Quad(high Dose 65+) 05/15/2019, 05/11/2020, 05/20/2021, 08/02/2022   Fluad Trivalent(High Dose 65+) 05/10/2023   Influenza Split 04/25/2011   Influenza Whole 08/01/2003, 04/20/2010   Influenza,inj,Quad PF,6+ Mos 05/15/2013, 04/20/2014, 06/30/2015, 07/06/2017   Influenza-Unspecified 05/14/2016   PFIZER(Purple Top)SARS-COV-2 Vaccination 09/22/2019, 10/13/2019   Pneumococcal Conjugate-13 08/18/2014   Pneumococcal Polysaccharide-23 09/06/2012   Td 11/17/2004   Tdap 07/04/2016   Zoster, Live 06/21/2010    There are no preventive care reminders to display for this patient.  Shingrix -is interested   Mammogram 07/2023 Personal history of breast cancer  Self breast exam- no lumps   Gyn health-hysterectomy   Colon cancer screening  cologuard neg 03/2023   Bone health  Dexa 04/2021-normal BMD Falls-none Fractures-none  Supplements -vit D , ca  Last vitamin D  Lab Results  Component Value Date   VD25OH 45.83 02/18/2024    Exercise  Uses a pedal machine daily  No strength training but wants to start a chair program     Mood    02/25/2024   10:45 AM 07/10/2023   10:15 AM 02/22/2023   11:00 AM 06/06/2022    8:55 AM 06/01/2021    9:40 AM  Depression screen PHQ 2/9  Decreased Interest 0 0 2 0 0  Down, Depressed, Hopeless 0 0 0 0 0  PHQ - 2 Score 0 0 2 0 0  Altered sleeping   0     Tired, decreased energy   2    Change in appetite   2    Feeling bad or failure about yourself    0    Trouble concentrating   0    Moving slowly or fidgety/restless   0    Suicidal thoughts   0    PHQ-9 Score   6    Difficult doing work/chores   Not difficult at all     Word searches/puzzles  Loves games  In [employer] group- lots of socialization   HTN bp is stable today  No cp or palpitations or headaches or edema  No side effects to medicines  BP Readings from Last 3 Encounters:  02/25/24 138/72  08/20/23 (!) 180/100  06/14/23 130/80     Lab Results  Component Value Date   NA 138 02/18/2024   K 4.4 02/18/2024   CO2 30 02/18/2024   GLUCOSE 102 (H) 02/18/2024   BUN 22 02/18/2024   CREATININE 0.94 02/18/2024   CALCIUM  9.4 02/18/2024   GFR 58.80 (L) 02/18/2024   EGFR 70 07/02/2023   GFRNONAA 60 (L) 09/07/2023   Chlorthalidone  25 mg daily Spironolactone  12.5 mg daily from cardiology  Really helps ankle swelling   GERD with past esophagitis  Omeprazole  20 mg bid   Lab Results  Component Value Date   VITAMINB12 337 02/18/2024  Hypothyroidism  Pt has no clinical changes No change in energy level/ hair or skin/ edema and no tremor Lab Results  Component Value Date   TSH 3.93 02/18/2024    Levothyroxine  75 mcg daily   History of thyroid  nodule    CLL-stable  Followed by onc Has appointment upcoming  Lab Results  Component Value Date   WBC 6.2 02/18/2024   HGB 12.7 02/18/2024   HCT 39.3 02/18/2024   MCV 92.0 02/18/2024   PLT 345.0 02/18/2024   Prediabetes Lab Results  Component Value Date   HGBA1C 6.1 02/18/2024   HGBA1C 6.0 02/13/2023   HGBA1C 6.3 12/01/2021   Very good about avoiding added sugar /carbs    Hyperlipidemia Lab Results  Component Value Date   CHOL 249 (H) 02/18/2024   CHOL 237 (H) 02/13/2023   CHOL 189 12/01/2021   Lab Results  Component Value Date   HDL 46.00 02/18/2024   HDL 46.00 02/13/2023   HDL 44.50 12/01/2021    Lab Results  Component Value Date   LDLCALC 179 (H) 02/18/2024   LDLCALC 173 (H) 02/13/2023   LDLCALC 122 (H) 12/01/2021   Lab Results  Component Value Date   TRIG 124.0 02/18/2024   TRIG 89.0 02/13/2023   TRIG 114.0 12/01/2021   Lab Results  Component Value Date   CHOLHDL 5 02/18/2024   CHOLHDL 5 02/13/2023   CHOLHDL 4 12/01/2021   Lab Results  Component Value Date   LDLDIRECT 139.2 08/25/2013   LDLDIRECT 151.5 03/31/2011   Zetia  10 mg daily  Intol of all statins and doses   Some atherosclerosis on CT 2023 - incidentally  Will follow up with cardiology soon   Eats low cholesterol diet   Lab Results  Component Value Date   ALT 12 02/18/2024   AST 16 02/18/2024   ALKPHOS 77 02/18/2024   BILITOT 0.4 02/18/2024   Lab Results  Component Value Date   WBC 6.2 02/18/2024   HGB 12.7 02/18/2024   HCT 39.3 02/18/2024   MCV 92.0 02/18/2024   PLT 345.0 02/18/2024     Patient Active Problem List   Diagnosis Date Noted   Systolic murmur 02/22/2023   CLL (chronic lymphocytic leukemia) (HCC) 02/22/2023   Elevated brain natriuretic peptide (BNP) level 04/27/2022   Left thyroid  nodule 04/10/2022   Abnormal EKG 03/06/2022   Ankle edema, bilateral 03/06/2022   Iron  deficiency anemia 01/11/2022   Gallstones 12/08/2021   Abdominal pain, right upper quadrant 12/05/2021   Fatigue 12/05/2021   Umbilical hernia 12/05/2021   Pedal edema 12/05/2021   DOE (dyspnea on exertion) 12/05/2021   Medicare annual wellness visit, subsequent 06/01/2021   Current use of proton pump inhibitor 06/01/2021   Hypothyroid 01/07/2020   History of breast cancer 12/05/2019   Prediabetes 07/04/2016   Routine general medical examination at a health care facility 07/04/2016   Estrogen deficiency 01/11/2016   Malignant neoplasm of upper-inner quadrant of right breast in female, estrogen receptor positive (HCC) 02/26/2015   Colon cancer screening 09/06/2012   Vitamin D  deficiency 12/28/2009    STRESS REACTION, ACUTE, WITH EMOTIONAL DISTURBANCE 12/07/2009   Vitamin B12 deficiency 03/07/2007   HYPERCHOLESTEROLEMIA 03/07/2007   Essential hypertension 03/07/2007   GERD 03/07/2007   Past Medical History:  Diagnosis Date   Anemia    Anxiety    Arthritis    hands, knees   Breast cancer (HCC)    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   Depression  Family history of adverse reaction to anesthesia    mother has had PONV   GERD (gastroesophageal reflux disease)    Hot flashes    Hyperlipidemia    Hypertension    Hypoglycemia    Hypothyroidism    Obesity    Personal history of radiation therapy    Vitamin B 12 deficiency    Past Surgical History:  Procedure Laterality Date   ABDOMINAL HYSTERECTOMY     BREAST BIOPSY     BREAST LUMPECTOMY Right    2016   BREAST REDUCTION SURGERY Bilateral 09/14/2020   Procedure: MAMMARY REDUCTION  (BREAST);  Surgeon: Arelia Filippo, MD;  Location: [facility];  Service: Plastics;  Laterality: Bilateral;   GASTRIC BYPASS  08/2003   KNEE SURGERY     left   PARTIAL HYSTERECTOMY  1990's   bleeding   RADIOACTIVE SEED GUIDED PARTIAL MASTECTOMY WITH AXILLARY SENTINEL LYMPH NODE BIOPSY Right 03/09/2015   Procedure: RADIOACTIVE SEED GUIDED RIGHT PARTIAL MASTECTOMY WITH AXILLARY SENTINEL LYMPH NODE BIOPSY;  Surgeon: Jina Nephew, MD;  Location: [facility];  Service: General;  Laterality: Right;   REDUCTION MAMMAPLASTY Bilateral 08/2020   TUBAL LIGATION     Social History   Tobacco Use   Smoking status: Never   Smokeless tobacco: Never  Vaping Use   Vaping status: Never Used  Substance Use Topics   Alcohol use: No   Drug use: No   Family History  Problem Relation Age of Onset   Heart disease Mother        MI   Hypertension Mother    Alcohol abuse Father    Heart disease Father        smoker   Hypertension Father    Breast cancer Paternal Grandmother     Diabetes Maternal Aunt    Allergies  Allergen Reactions   Crestor  [Rosuvastatin  Calcium ] Other (See Comments)    Myalgia    Lipitor [Atorvastatin Calcium ] Other (See Comments)    Aches and pains   Penicillins Shortness Of Breath and Swelling   Zocor [Simvastatin - High Dose] Other (See Comments)    Muscle/joint aches   Ferrex 150 [Polysaccharide Iron  Complex]     Indigestion   Current Outpatient Medications on File Prior to Visit  Medication Sig Dispense Refill   acetaminophen  (TYLENOL ) 500 MG tablet Take 1,000 mg by mouth every 6 (six) hours as needed for moderate pain.     ALPRAZolam  (XANAX ) 0.5 MG tablet 1 pill by mouth twice daily as needed for anxiety before procedure 6 tablet 0   Biotin 10 MG TABS Take 10 mg by mouth in the morning and at bedtime.     CALCIUM  PO Take 1 tablet by mouth daily.     chlorthalidone  (HYGROTON ) 25 MG tablet Take 1 tablet by mouth once daily 90 tablet 0   Cholecalciferol (D3 VITAMIN PO) Take 4,000 Units by mouth daily.     diclofenac Sodium (VOLTAREN) 1 % GEL Apply 2 g topically 4 (four) times daily as needed (pain).     ezetimibe  (ZETIA ) 10 MG tablet Take 1 tablet (10 mg total) by mouth daily. 90 tablet 3   ketoconazole  (NIZORAL ) 2 % cream APPLY TOPICALLY DAILY (Patient taking differently: 1 Application daily as needed (irritation under breast).) 15 g 0   levothyroxine  (SYNTHROID ) 75 MCG tablet TAKE 1 TABLET BY MOUTH ONCE DAILY BEFORE BREAKFAST 90 tablet 3   loratadine (CLARITIN) 10 MG tablet Take 10 mg by mouth daily as needed  for allergies.     Multiple Vitamin (MULTIVITAMIN) capsule Take 1 capsule by mouth daily.     naproxen sodium (ALEVE) 220 MG tablet Take 220 mg by mouth daily as needed (pain).     omeprazole  (PRILOSEC) 20 MG capsule TAKE 1 CAPSULE BY MOUTH TWICE DAILY BEFORE A MEAL 180 capsule 1   OVER THE COUNTER MEDICATION Take 1 capsule by mouth daily. InstaFlex     spironolactone  (ALDACTONE ) 25 MG tablet Take 0.5 tablets (12.5 mg total) by  mouth daily. 45 tablet 3   No current facility-administered medications on file prior to visit.    Review of Systems  Constitutional:  Negative for activity change, appetite change, fatigue, fever and unexpected weight change.  HENT:  Negative for congestion, ear pain, rhinorrhea, sinus pressure and sore throat.   Eyes:  Negative for pain, redness and visual disturbance.  Respiratory:  Negative for cough, shortness of breath and wheezing.   Cardiovascular:  Negative for chest pain and palpitations.  Gastrointestinal:  Negative for abdominal pain, blood in stool, constipation and diarrhea.  Endocrine: Negative for polydipsia and polyuria.  Genitourinary:  Negative for dysuria, frequency and urgency.  Musculoskeletal:  Negative for arthralgias, back pain and myalgias.  Skin:  Negative for pallor and rash.  Allergic/Immunologic: Negative for environmental allergies.  Neurological:  Negative for dizziness, syncope and headaches.  Hematological:  Negative for adenopathy. Does not bruise/bleed easily.  Psychiatric/Behavioral:  Negative for decreased concentration and dysphoric mood. The patient is not nervous/anxious.        Objective:   Physical Exam Constitutional:      General: She is not in acute distress.    Appearance: Normal appearance. She is well-developed. She is obese. She is not ill-appearing or diaphoretic.  HENT:     Head: Normocephalic and atraumatic.     Right Ear: Tympanic membrane, ear canal and external ear normal.     Left Ear: Tympanic membrane, ear canal and external ear normal.     Nose: Nose normal. No congestion.     Mouth/Throat:     Mouth: Mucous membranes are moist.     Pharynx: Oropharynx is clear. No posterior oropharyngeal erythema.  Eyes:     General: No scleral icterus.    Extraocular Movements: Extraocular movements intact.     Conjunctiva/sclera: Conjunctivae normal.     Pupils: Pupils are equal, round, and reactive to light.  Neck:     Thyroid :  No thyromegaly.     Vascular: No carotid bruit or JVD.  Cardiovascular:     Rate and Rhythm: Normal rate and regular rhythm.     Pulses: Normal pulses.     Heart sounds: Normal heart sounds.     No gallop.  Pulmonary:     Effort: Pulmonary effort is normal. No respiratory distress.     Breath sounds: Normal breath sounds. No wheezing.     Comments: Good air exch Chest:     Chest wall: No tenderness.  Abdominal:     General: Bowel sounds are normal. There is no distension or abdominal bruit.     Palpations: Abdomen is soft. There is no mass.     Tenderness: There is no abdominal tenderness.     Hernia: No hernia is present.  Genitourinary:    Comments: Breast and pelvic exam are done by gyn provider   Musculoskeletal:        General: No tenderness. Normal range of motion.     Cervical back: Normal range of motion  and neck supple. No rigidity. No muscular tenderness.     Right lower leg: No edema.     Left lower leg: No edema.     Comments: No kyphosis   Lymphadenopathy:     Cervical: No cervical adenopathy.  Skin:    General: Skin is warm and dry.     Coloration: Skin is not pale.     Findings: No erythema or rash.     Comments: Solar lentigines diffusely   Neurological:     Mental Status: She is alert. Mental status is at baseline.     Cranial Nerves: No cranial nerve deficit.     Motor: No abnormal muscle tone.     Coordination: Coordination normal.     Gait: Gait normal.     Deep Tendon Reflexes: Reflexes are normal and symmetric. Reflexes normal.  Psychiatric:        Mood and Affect: Mood normal.        Cognition and Memory: Cognition and memory normal.           Assessment & Plan:   Problem List Items Addressed This Visit       Cardiovascular and Mediastinum   Essential hypertension   bp in fair control at this time  BP Readings from Last 1 Encounters:  02/25/24 138/72   No changes needed Most recent labs reviewed  Disc lifstyle change with low  sodium diet and exercise  Chlorthalidone  25 mg daily  Spironolactone  12.5 mg daily           Digestive   GERD   Continues omeprazole  20 mg daily  Lab Results  Component Value Date   VITAMINB12 337 02/18/2024           Endocrine   Hypothyroid   Hypothyroidism  Pt has no clinical changes No change in energy level/ hair or skin/ edema and no tremor Lab Results  Component Value Date   TSH 3.93 02/18/2024     Continues levothyroxine  75 mcg daily          Other   Vitamin D  deficiency   Last vitamin D  Lab Results  Component Value Date   VD25OH 45.83 02/18/2024   Vitamin D  level is therapeutic with current supplementation Disc importance of this to bone and overall health       Vitamin B12 deficiency   Lab Results  Component Value Date   VITAMINB12 337 02/18/2024   Continues heme input       Routine general medical examination at a health care facility - Primary   Reviewed health habits including diet and exercise and skin cancer prevention Reviewed appropriate screening tests for age  Also reviewed health mt list, fam hx and immunization status , as well as social and family history   See HPI Labs reviewed and ordered Health Maintenance  Topic Date Due   COVID-19 Vaccine (3 - Pfizer risk series) 03/09/2025*   Zoster (Shingles) Vaccine (1 of 2) 05/24/2026*   Flu Shot  02/29/2024   Medicare Annual Wellness Visit  07/09/2024   Mammogram  07/29/2024   DTaP/Tdap/Td vaccine (3 - Td or Tdap) 07/04/2026   Pneumococcal Vaccine for age over 19  Completed   DEXA scan (bone density measurement)  Completed   Hepatitis C Screening  Completed   Hepatitis B Vaccine  Aged Out   HPV Vaccine  Aged Out   Meningitis B Vaccine  Aged Out   Cologuard (Stool DNA test)  Discontinued  *Topic was postponed. The  date shown is not the original due date.    Plans to get shingrix at pharmacy  Cologuard neg 03/2023  Discussed fall prevention, supplements and exercise for bone  density  No falls or fracture  PHQ 0        Prediabetes   Lab Results  Component Value Date   HGBA1C 6.1 02/18/2024   HGBA1C 6.0 02/13/2023   HGBA1C 6.3 12/01/2021   disc imp of low glycemic diet and wt loss to prevent DM2  Commended on weight loss       HYPERCHOLESTEROLEMIA   Disc goals for lipids and reasons to control them Rev last labs with pt Rev low sat fat diet in detail   LDL 179 Good diet Intol to all statins  Zetia  10 mg   Has cardiology follow up  Noted some coronary ca on CT 2023 incidental   ? If candidate for pcky9 ? Med       Current use of proton pump inhibitor   Lab Results  Component Value Date   VITAMINB12 337 02/18/2024   Continues to watch Last vitamin D  Lab Results  Component Value Date   VD25OH 45.83 02/18/2024         Colon cancer screening   Cologuard neg 03/2023       CLL (chronic lymphocytic leukemia) (HCC)   Continues onc care Lab Results  Component Value Date   WBC 6.2 02/18/2024   HGB 12.7 02/18/2024   HCT 39.3 02/18/2024   MCV 92.0 02/18/2024   PLT 345.0 02/18/2024   LN palpable in left neck

## 2024-02-25 NOTE — Assessment & Plan Note (Signed)
 Disc goals for lipids and reasons to control them Rev last labs with pt Rev low sat fat diet in detail   LDL 179 Good diet Intol to all statins  Zetia  10 mg   Has cardiology follow up  Noted some coronary ca on CT 2023 incidental   ? If candidate for pcky9 ? Med

## 2024-02-25 NOTE — Patient Instructions (Addendum)
 If you are interested in the new shingles vaccine (Shingrix) - call your local pharmacy to check on coverage and availability    Stay active  Keep pedaling  Add some strength training to your routine, this is important for bone and brain health and can reduce your risk of falls and help your body use insulin properly and regulate weight  Light weights, exercise bands , and internet videos are a good way to start  Yoga (chair or regular), machines , floor exercises or a gym with machines are also good options   Take care of yourself   Get a flu shot in the fall   No change in medicines

## 2024-02-25 NOTE — Assessment & Plan Note (Signed)
 Cologuard neg 03/2023

## 2024-02-25 NOTE — Assessment & Plan Note (Signed)
 Hypothyroidism  Pt has no clinical changes No change in energy level/ hair or skin/ edema and no tremor Lab Results  Component Value Date   TSH 3.93 02/18/2024     Continues levothyroxine  75 mcg daily

## 2024-02-25 NOTE — Assessment & Plan Note (Signed)
 Continues onc care Lab Results  Component Value Date   WBC 6.2 02/18/2024   HGB 12.7 02/18/2024   HCT 39.3 02/18/2024   MCV 92.0 02/18/2024   PLT 345.0 02/18/2024   LN palpable in left neck

## 2024-02-25 NOTE — Assessment & Plan Note (Signed)
 Continues omeprazole  20 mg daily  Lab Results  Component Value Date   VITAMINB12 337 02/18/2024

## 2024-03-02 ENCOUNTER — Telehealth: Payer: Self-pay | Admitting: Family Medicine

## 2024-03-02 DIAGNOSIS — E041 Nontoxic single thyroid nodule: Secondary | ICD-10-CM

## 2024-03-02 NOTE — Telephone Encounter (Signed)
 Pt is due for her 1 year thyroid  ultrasound re check  Is it ok if I go ahead and order that ?

## 2024-03-03 DIAGNOSIS — Z961 Presence of intraocular lens: Secondary | ICD-10-CM | POA: Diagnosis not present

## 2024-03-03 NOTE — Telephone Encounter (Signed)
 Left message to return call to our office.  Please review message with patient when she calls back.

## 2024-03-07 ENCOUNTER — Inpatient Hospital Stay: Payer: Medicare Other | Attending: Hematology and Oncology

## 2024-03-07 ENCOUNTER — Inpatient Hospital Stay: Payer: Medicare Other | Admitting: Hematology and Oncology

## 2024-03-07 ENCOUNTER — Other Ambulatory Visit: Payer: Self-pay | Admitting: Hematology and Oncology

## 2024-03-07 ENCOUNTER — Other Ambulatory Visit: Payer: Self-pay | Admitting: Family Medicine

## 2024-03-07 VITALS — BP 193/70 | HR 75 | Temp 97.2°F | Resp 14 | Wt 202.5 lb

## 2024-03-07 DIAGNOSIS — D509 Iron deficiency anemia, unspecified: Secondary | ICD-10-CM | POA: Insufficient documentation

## 2024-03-07 DIAGNOSIS — Z803 Family history of malignant neoplasm of breast: Secondary | ICD-10-CM | POA: Insufficient documentation

## 2024-03-07 DIAGNOSIS — Z1231 Encounter for screening mammogram for malignant neoplasm of breast: Secondary | ICD-10-CM

## 2024-03-07 DIAGNOSIS — Z79899 Other long term (current) drug therapy: Secondary | ICD-10-CM | POA: Insufficient documentation

## 2024-03-07 DIAGNOSIS — C9110 Chronic lymphocytic leukemia of B-cell type not having achieved remission: Secondary | ICD-10-CM | POA: Diagnosis not present

## 2024-03-07 DIAGNOSIS — R5383 Other fatigue: Secondary | ICD-10-CM | POA: Insufficient documentation

## 2024-03-07 DIAGNOSIS — C8300 Small cell B-cell lymphoma, unspecified site: Secondary | ICD-10-CM

## 2024-03-07 DIAGNOSIS — Z853 Personal history of malignant neoplasm of breast: Secondary | ICD-10-CM

## 2024-03-07 LAB — CBC WITH DIFFERENTIAL (CANCER CENTER ONLY)
Abs Immature Granulocytes: 0.01 K/uL (ref 0.00–0.07)
Basophils Absolute: 0.1 K/uL (ref 0.0–0.1)
Basophils Relative: 1 %
Eosinophils Absolute: 0.2 K/uL (ref 0.0–0.5)
Eosinophils Relative: 3 %
HCT: 39.7 % (ref 36.0–46.0)
Hemoglobin: 12.9 g/dL (ref 12.0–15.0)
Immature Granulocytes: 0 %
Lymphocytes Relative: 19 %
Lymphs Abs: 1.4 K/uL (ref 0.7–4.0)
MCH: 29.9 pg (ref 26.0–34.0)
MCHC: 32.5 g/dL (ref 30.0–36.0)
MCV: 92.1 fL (ref 80.0–100.0)
Monocytes Absolute: 0.7 K/uL (ref 0.1–1.0)
Monocytes Relative: 9 %
Neutro Abs: 4.8 K/uL (ref 1.7–7.7)
Neutrophils Relative %: 68 %
Platelet Count: 324 K/uL (ref 150–400)
RBC: 4.31 MIL/uL (ref 3.87–5.11)
RDW: 13.4 % (ref 11.5–15.5)
WBC Count: 7.1 K/uL (ref 4.0–10.5)
nRBC: 0 % (ref 0.0–0.2)

## 2024-03-07 LAB — CMP (CANCER CENTER ONLY)
ALT: 11 U/L (ref 0–44)
AST: 16 U/L (ref 15–41)
Albumin: 4.4 g/dL (ref 3.5–5.0)
Alkaline Phosphatase: 84 U/L (ref 38–126)
Anion gap: 7 (ref 5–15)
BUN: 24 mg/dL — ABNORMAL HIGH (ref 8–23)
CO2: 30 mmol/L (ref 22–32)
Calcium: 9.1 mg/dL (ref 8.9–10.3)
Chloride: 100 mmol/L (ref 98–111)
Creatinine: 1.08 mg/dL — ABNORMAL HIGH (ref 0.44–1.00)
GFR, Estimated: 53 mL/min — ABNORMAL LOW (ref >60)
Glucose, Bld: 108 mg/dL — ABNORMAL HIGH (ref 70–99)
Potassium: 3.7 mmol/L (ref 3.5–5.1)
Sodium: 137 mmol/L (ref 135–145)
Total Bilirubin: 0.4 mg/dL (ref 0.0–1.2)
Total Protein: 7.5 g/dL (ref 6.5–8.1)

## 2024-03-07 LAB — RETIC PANEL
Immature Retic Fract: 7.1 % (ref 2.3–15.9)
RBC.: 4.18 MIL/uL (ref 3.87–5.11)
Retic Count, Absolute: 43.9 K/uL (ref 19.0–186.0)
Retic Ct Pct: 1.1 % (ref 0.4–3.1)
Reticulocyte Hemoglobin: 34.7 pg (ref >27.9)

## 2024-03-07 LAB — IRON AND IRON BINDING CAPACITY (CC-WL,HP ONLY)
Iron: 103 ug/dL (ref 28–170)
Saturation Ratios: 26 % (ref 10.4–31.8)
TIBC: 403 ug/dL (ref 250–450)
UIBC: 300 ug/dL (ref 148–442)

## 2024-03-07 LAB — LACTATE DEHYDROGENASE: LDH: 156 U/L (ref 98–192)

## 2024-03-07 LAB — VITAMIN B12: Vitamin B-12: 280 pg/mL (ref 180–914)

## 2024-03-07 LAB — FERRITIN: Ferritin: 65 ng/mL (ref 11–307)

## 2024-03-07 NOTE — Progress Notes (Signed)
 [facility] [phone]   [phone]  PROGRESS NOTE  Patient Care Team: Tower, Laine LABOR, MD as PCP - General Neysa Inocente PARAS, NP (Inactive) as Nurse Practitioner (Obstetrics and Gynecology) Aron Shoulders, MD as Consulting Physician (General Surgery) Glean Stephane BROCKS, RN (Inactive) as Registered Nurse Tyree Nanetta SAILOR, RN as Registered Nurse Moses Powell Hummer, NP as Nurse Practitioner (Nurse Practitioner) Rubie Kemps, MD as Consulting Physician (Orthopedic Surgery)  CHIEF COMPLAINTS/PURPOSE OF CONSULTATION:  -Iron  deficiency anemia -History of right breast cancer -Left axillary lymphadenopathy--biopsy-proven CLL/SLL  HISTORY OF PRESENTING ILLNESS:  Jennifer Bartlett 78 y.o. female returns for a follow up for CLL/SLL and iron  deficiency anemia. She was last seen on 09/07/2023. She is unaccompanied for this visit.   On exam today, Jennifer Bartlett reports she has been well overall since her last visit.  She remains in good spirits and has good energy levels.  She reports that she does feel tired some days but is able to push through.  She is doing her best to try to stay cool and hydrated this hot summer.  She reports her appetite is good.  She notes that she has had no other major changes in her health in the interim since her last visit.  She reports that she does not eat a lot of meat and does not care for beef.  She reports that she has not noticed any lymph node swelling but does feel prominent lymph nodes on the left side of her neck.  She reports that she normally takes her blood pressure at home and it has been quite normal.  She is surprised by the high number today.  She notes that she is also scheduled for her mammogram later this year.  Overall she has no questions concerns or complaints.  She denies any fevers, chills, sweats, nausea, vomiting or diarrhea.  She is not having any weight loss.  A full 10 point ROS is otherwise negative.  MEDICAL  HISTORY:  Past Medical History:  Diagnosis Date   Anemia    Anxiety    Arthritis    hands, knees   Breast cancer (HCC)    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   Depression    Family history of adverse reaction to anesthesia    mother has had PONV   GERD (gastroesophageal reflux disease)    Hot flashes    Hyperlipidemia    Hypertension    Hypoglycemia    Hypothyroidism    Obesity    Personal history of radiation therapy    Vitamin B 12 deficiency     SURGICAL HISTORY: Past Surgical History:  Procedure Laterality Date   ABDOMINAL HYSTERECTOMY     BREAST BIOPSY     BREAST LUMPECTOMY Right    2016   BREAST REDUCTION SURGERY Bilateral 09/14/2020   Procedure: MAMMARY REDUCTION  (BREAST);  Surgeon: Arelia Filippo, MD;  Location: [facility];  Service: Plastics;  Laterality: Bilateral;   GASTRIC BYPASS  08/2003   KNEE SURGERY     left   PARTIAL HYSTERECTOMY  1990's   bleeding   RADIOACTIVE SEED GUIDED PARTIAL MASTECTOMY WITH AXILLARY SENTINEL LYMPH NODE BIOPSY Right 03/09/2015   Procedure: RADIOACTIVE SEED GUIDED RIGHT PARTIAL MASTECTOMY WITH AXILLARY SENTINEL LYMPH NODE BIOPSY;  Surgeon: Shoulders Aron, MD;  Location: [facility];  Service: General;  Laterality: Right;   REDUCTION MAMMAPLASTY Bilateral 08/2020   TUBAL LIGATION  SOCIAL HISTORY: Social History   Socioeconomic History   Marital status: Married    Spouse name: Not on file   Number of children: 3   Years of education: Not on file   Highest education level: Not on file  Occupational History    Employer: [employer]  Tobacco Use   Smoking status: Never   Smokeless tobacco: Never  Vaping Use   Vaping status: Never Used  Substance and Sexual Activity   Alcohol use: No   Drug use: No   Sexual activity: Yes    Partners: Male    Birth control/protection: Surgical    Comment: Hyst.  Other Topics Concern   Not on file   Social History Narrative   Regular walking for exercise.   Social Drivers of Corporate investment banker Strain: Low Risk  (07/10/2023)   Overall Financial Resource Strain (CARDIA)    Difficulty of Paying Living Expenses: Not hard at all  Food Insecurity: No Food Insecurity (07/10/2023)   Hunger Vital Sign    Worried About Running Out of Food in the Last Year: Never true    Ran Out of Food in the Last Year: Never true  Transportation Needs: No Transportation Needs (07/10/2023)   PRAPARE - Administrator, Civil Service (Medical): No    Lack of Transportation (Non-Medical): No  Physical Activity: Sufficiently Active (07/10/2023)   Exercise Vital Sign    Days of Exercise per Week: 3 days    Minutes of Exercise per Session: 60 min  Stress: No Stress Concern Present (07/10/2023)   [employer] of Occupational Health - Occupational Stress Questionnaire    Feeling of Stress : Not at all  Social Connections: Socially Integrated (07/10/2023)   Social Connection and Isolation Panel    Frequency of Communication with Friends and Family: More than three times a week    Frequency of Social Gatherings with Friends and Family: More than three times a week    Attends Religious Services: More than 4 times per year    Active Member of [employer] or Organizations: Yes    Attends Engineer, structural: More than 4 times per year    Marital Status: Married  Catering manager Violence: Not At Risk (07/10/2023)   Humiliation, Afraid, Rape, and Kick questionnaire    Fear of Current or Ex-Partner: No    Emotionally Abused: No    Physically Abused: No    Sexually Abused: No    FAMILY HISTORY: Family History  Problem Relation Age of Onset   Heart disease Mother        MI   Hypertension Mother    Alcohol abuse Father    Heart disease Father        smoker   Hypertension Father    Breast cancer Paternal Grandmother    Diabetes Maternal Aunt     ALLERGIES:  is allergic to  crestor  [rosuvastatin  calcium ], lipitor [atorvastatin calcium ], penicillins, zocor [simvastatin - high dose], and ferrex 150 [polysaccharide iron  complex].  MEDICATIONS:  Current Outpatient Medications  Medication Sig Dispense Refill   acetaminophen  (TYLENOL ) 500 MG tablet Take 1,000 mg by mouth every 6 (six) hours as needed for moderate pain.     ALPRAZolam  (XANAX ) 0.5 MG tablet 1 pill by mouth twice daily as needed for anxiety before procedure 6 tablet 0   Biotin 10 MG TABS Take 10 mg by mouth in the morning and at bedtime.     CALCIUM  PO Take 1 tablet by  mouth daily.     chlorthalidone  (HYGROTON ) 25 MG tablet Take 1 tablet by mouth once daily 90 tablet 0   Cholecalciferol (D3 VITAMIN PO) Take 4,000 Units by mouth daily.     diclofenac Sodium (VOLTAREN) 1 % GEL Apply 2 g topically 4 (four) times daily as needed (pain).     ezetimibe  (ZETIA ) 10 MG tablet Take 1 tablet (10 mg total) by mouth daily. 90 tablet 3   ketoconazole  (NIZORAL ) 2 % cream APPLY TOPICALLY DAILY (Patient taking differently: 1 Application daily as needed (irritation under breast).) 15 g 0   levothyroxine  (SYNTHROID ) 75 MCG tablet TAKE 1 TABLET BY MOUTH ONCE DAILY BEFORE BREAKFAST 90 tablet 3   loratadine (CLARITIN) 10 MG tablet Take 10 mg by mouth daily as needed for allergies.     Multiple Vitamin (MULTIVITAMIN) capsule Take 1 capsule by mouth daily.     naproxen sodium (ALEVE) 220 MG tablet Take 220 mg by mouth daily as needed (pain).     omeprazole  (PRILOSEC) 20 MG capsule TAKE 1 CAPSULE BY MOUTH TWICE DAILY BEFORE A MEAL 180 capsule 1   OVER THE COUNTER MEDICATION Take 1 capsule by mouth daily. InstaFlex     spironolactone  (ALDACTONE ) 25 MG tablet Take 0.5 tablets (12.5 mg total) by mouth daily. 45 tablet 3   No current facility-administered medications for this visit.    REVIEW OF SYSTEMS:   Constitutional: ( - ) fevers, ( - )  chills , ( - ) night sweats Eyes: ( - ) blurriness of vision, ( - ) double vision, ( - )  watery eyes Ears, nose, mouth, throat, and face: ( - ) mucositis, ( - ) sore throat Respiratory: ( - ) cough, (- ) dyspnea, ( - ) wheezes Cardiovascular: ( - ) palpitation, ( - ) chest discomfort, ( - ) lower extremity swelling Gastrointestinal:  ( - ) nausea, ( - ) heartburn, ( - ) change in bowel habits Skin: ( - ) abnormal skin rashes Lymphatics: ( + ) new lymphadenopathy, ( - ) easy bruising Neurological: ( - ) numbness, ( - ) tingling, ( - ) new weaknesses Behavioral/Psych: ( - ) mood change, ( - ) new changes  All other systems were reviewed with the patient and are negative.  PHYSICAL EXAMINATION: ECOG PERFORMANCE STATUS: 1 - Symptomatic but completely ambulatory  Vitals:   03/07/24 1047  BP: (!) 193/70  Pulse: 75  Resp: 14  Temp: (!) 97.2 F (36.2 C)  SpO2: 100%     Filed Weights   03/07/24 1047  Weight: 202 lb 8 oz (91.9 kg)     GENERAL: well appearing female in NAD  SKIN: skin color, texture, turgor are normal, no rashes or significant lesions EYES: conjunctiva are pink and non-injected, sclera clear LYMPH:  no palpable lymphadenopathy in the cervical, supraclavicular lymph nodes. Subcentimeter lymph nodes appreciated posterior cervical region bilaterally. LUNGS: clear to auscultation and percussion with normal breathing effort HEART: regular rate & rhythm and no murmurs and no lower extremity edema Musculoskeletal: no cyanosis of digits and no clubbing  PSYCH: alert & oriented x 3, fluent speech NEURO: no focal motor/sensory deficits  LABORATORY DATA:  I have reviewed the data as listed    Latest Ref Rng & Units 03/07/2024    9:46 AM 02/18/2024    8:02 AM 09/07/2023    9:20 AM  CBC  WBC 4.0 - 10.5 K/uL 7.1  6.2  7.3   Hemoglobin 12.0 - 15.0 g/dL 12.9  12.7  13.5   Hematocrit 36.0 - 46.0 % 39.7  39.3  41.6   Platelets 150 - 400 K/uL 324  345.0  304        Latest Ref Rng & Units 03/07/2024    9:46 AM 02/18/2024    8:02 AM 09/07/2023    9:20 AM  CMP  Glucose  70 - 99 mg/dL 891  897  893   BUN 8 - 23 mg/dL 24  22  20    Creatinine 0.44 - 1.00 mg/dL 8.91  9.05  9.01   Sodium 135 - 145 mmol/L 137  138  136   Potassium 3.5 - 5.1 mmol/L 3.7  4.4  3.6   Chloride 98 - 111 mmol/L 100  99  97   CO2 22 - 32 mmol/L 30  30  32   Calcium  8.9 - 10.3 mg/dL 9.1  9.4  9.6   Total Protein 6.5 - 8.1 g/dL 7.5  6.9  7.9   Total Bilirubin 0.0 - 1.2 mg/dL 0.4  0.4  0.5   Alkaline Phos 38 - 126 U/L 84  77  96   AST 15 - 41 U/L 16  16  14    ALT 0 - 44 U/L 11  12  7      ASSESSMENT & PLAN ALIZAY BRONKEMA is a 78 y.o. female returns for follow-up.   #Iron  deficiency anemia: --Secondary to malasorbption due to bariatric surgery --Placed referral to GI but consultation never took place. Consider re-referral if iron  deficiency reoccurs.  --Received IV feraheme  510 mg x 2 doses on 01/18/2022 and 01/25/2022.  --Labs show WBC 7.1, Hgb 12.9, MCV 92.1, Plt 324 --No need for additional IV iron  infusions at this time.   #H/O bariatric surgery: --B12 levels normal from 03/07/2024 --Recheck periodically, every 6 months.   #H/O right breast cancer: --Medical oncologist was Dr. Layla. Completed treatment in October 2016 and completed 5 years of Tamoxifen  therapy in January 2022.  --Currently on surveillance with PCP.  --Last mammogram was 07/30/2023 which was negative for malignancy. Next due in one year.   #Left breast mass and left axillary lymphadenopathy: # CLL Rai Stage II  --CT chest from 04/07/2022 showed numerous enlarged and nonenlarged left axillary lymph nodes measuring up to 16 mm short axis --Physical exam findings were concerning for palpable mass in the outer lower quadrant of the left breast.  -- left axillary US  with biopsy on 04/28/2023 confirmed CLL/SLL -- No indication to start treatment at this time with no evidence of cytopenias or B-symptoms.   Follow up: --6 months: labs and follow up  No orders of the defined types were placed in this  encounter.   All questions were answered. The patient knows to call the clinic with any problems, questions or concerns.  I have spent a total of 30 minutes minutes of face-to-face and non-face-to-face time, preparing to see the patient, performing a medically appropriate examination, counseling and educating the patient, ordering medications/tests/procedures, documenting clinical information in the electronic health record, independently interpreting results and communicating results to the patient, and care coordination.   Norleen IVAR Kidney, MD Department of Hematology/Oncology [facility] at [facility] Phone: [phone] Pager: [phone] Email: [email or web address]  Shermon HERO, Cheson BD, Catovsky D, Caligaris-Cappio F, Dighiero G, Dhner H, Hillmen P, Keating M, Montserrat E, Chiorazzi N, Stilgenbauer S, Rai KR, Garwood, Eichhorst B, O'Brien S, Robak T, Seymour JF, Tecumseh UG. iwCLL guidelines for diagnosis, indications for treatment, response  assessment, and supportive management of CLL. Blood. 2018 Jun 21;131(25):2745-2760.  Active disease should be clearly documented to initiate therapy. At least 1 of the following criteria should be met.  1) Evidence of progressive marrow failure as manifested by the development of, or worsening of, anemia and/or thrombocytopenia. Cutoff levels of Hb <10 g/dL or platelet counts <899  109/L are generally regarded as indication for treatment. However, in some patients, platelet counts <100  109/L may remain stable over a long period; this situation does not automatically require therapeutic intervention. 2) Massive (ie, >=6 cm below the left costal margin) or progressive or symptomatic splenomegaly. 3) Massive nodes (ie, >=10 cm in longest diameter) or progressive or symptomatic lymphadenopathy. 4) Progressive lymphocytosis with an increase of >=50% over a 23-month period, or lymphocyte doubling time (LDT) <6 months. LDT can be  obtained by linear regression extrapolation of absolute lymphocyte counts obtained at intervals of 2 weeks over an observation period of 2 to 3 months; patients with initial blood lymphocyte counts <30  109/L may require a longer observation period to determine the LDT. Factors contributing to lymphocytosis other than CLL (eg, infections, steroid administration) should be excluded. 5) Autoimmune complications including anemia or thrombocytopenia poorly responsive to corticosteroids. 6) Symptomatic or functional extranodal involvement (eg, skin, kidney, lung, spine). Disease-related symptoms as defined by any of the following: Unintentional weight loss >=10% within the previous 6 months. Significant fatigue (ie, ECOG performance scale 2 or worse; cannot work or unable to perform usual activities). Fevers >=100.78F or 38.0C for 2 or more weeks without evidence of infection. Night sweats for >=1 month without evidence of infection.

## 2024-03-09 ENCOUNTER — Encounter: Payer: Self-pay | Admitting: Physician Assistant

## 2024-03-12 LAB — METHYLMALONIC ACID, SERUM: Methylmalonic Acid, Quantitative: 749 nmol/L — ABNORMAL HIGH (ref 0–378)

## 2024-03-13 NOTE — Telephone Encounter (Signed)
 The order is in

## 2024-03-13 NOTE — Telephone Encounter (Signed)
 Pt notified of Dr. Graham comments. She is okay with PCP ordering thyroid  US , pt is okay to go back to the [facility] she went to last year. Pt advised if she doesn't get a call within 2 weeks to get appt to let us  know

## 2024-03-25 ENCOUNTER — Ambulatory Visit
Admission: RE | Admit: 2024-03-25 | Discharge: 2024-03-25 | Disposition: A | Discharge status: Home / Self Care | Source: Ambulatory Visit | Attending: Family Medicine | Admitting: Family Medicine

## 2024-03-25 DIAGNOSIS — E041 Nontoxic single thyroid nodule: Secondary | ICD-10-CM | POA: Diagnosis not present

## 2024-03-26 ENCOUNTER — Ambulatory Visit: Attending: Cardiology | Admitting: Cardiology

## 2024-03-26 ENCOUNTER — Encounter: Payer: Self-pay | Admitting: Cardiology

## 2024-03-26 VITALS — BP 172/64 | HR 63 | Ht 62.5 in | Wt 198.0 lb

## 2024-03-26 DIAGNOSIS — E785 Hyperlipidemia, unspecified: Secondary | ICD-10-CM

## 2024-03-26 DIAGNOSIS — I358 Other nonrheumatic aortic valve disorders: Secondary | ICD-10-CM | POA: Diagnosis not present

## 2024-03-26 DIAGNOSIS — Z789 Other specified health status: Secondary | ICD-10-CM | POA: Diagnosis not present

## 2024-03-26 DIAGNOSIS — I2510 Atherosclerotic heart disease of native coronary artery without angina pectoris: Secondary | ICD-10-CM

## 2024-03-26 NOTE — Patient Instructions (Signed)
 Medication Instructions:  The current medical regimen is effective;  continue present plan and medications.  *If you need a refill on your cardiac medications before your next appointment, please call your pharmacy*  Testing/Procedures: You are scheduled for a Lexiscan Myocardial Perfusion Imaging Study on     at  .   Please arrive 15 minutes prior to your appointment time for registration and insurance purposes.   The test will take approximately 3 to 4 hours to complete; you may bring reading material. If someone comes with you to your appointment, they will need to remain in the main lobby due to limited space in the testing area.   If you are pregnant or breastfeeding, please notify the nuclear lab prior to your appointment.   How to prepare for your Lexiscan Myocardial Perfusion test:   Do not eat or drink 3 hours prior to your test, except you may have water.    Do not consume products containing caffeine (regular or decaffeinated) 12 hours prior to your test (ex: coffee, chocolate, soda, tea)   Do bring a list of your current medications with you. If not listed below, you may take your medications as normal.   Bring any held medication to your appointment, as you may be required to take it once the test is complete.   Do wear comfortable clothes (no dresses or overalls) and walking shoes. Tennis shoes are preferred. No heels or open toed shoes.  Do not wear cologne, perfume, aftershave or lotions (deodorant is allowed).   If these instructions are not followed, you test will have to be rescheduled.   Please report to [address] [facility] for your test. If you have questions or concerns about your appointment, please call the Nuclear Lab at #[phone].  If you cannot keep your appointment, please provide 24 hour notification to the Nuclear lab to avoid a possible $50 charge to your account.   You have been referred to our Lipid Clinic (here with our Pharmacist) to discuss  Repatha.   Follow-Up: At [facility], you and your health needs are our priority.  As part of our continuing mission to provide you with exceptional heart care, our providers are all part of one team.  This team includes your primary Cardiologist (physician) and Advanced Practice Providers or APPs (Physician Assistants and Nurse Practitioners) who all work together to provide you with the care you need, when you need it.  Your next appointment:   1 year(s)  Provider:   Oneil Parchment, MD    We recommend signing up for the patient portal called MyChart.  Sign up information is provided on this After Visit Summary.  MyChart is used to connect with patients for Virtual Visits (Telemedicine).  Patients are able to view lab/test results, encounter notes, upcoming appointments, etc.  Non-urgent messages can be sent to your provider as well.   To learn more about what you can do with MyChart, go to [email or web address].

## 2024-03-26 NOTE — Progress Notes (Signed)
 Cardiology Office Note:  .   Date:  03/26/2024  ID:  Jennifer Bartlett, DOB 03/19/1946, MRN [id number] PCP: Randeen Laine DELENA, MD  [facility] Providers Cardiologist:  Oneil Parchment, MD    History of Present Illness: Jennifer Bartlett is a 78 y.o. female Discussed the use of AI scribe software for clinical note transcription with the patient, who gave verbal consent to proceed.  History of Present Illness Jennifer Bartlett is a 78 year old female with coronary artery disease and chronic lymphocytic leukemia who presents for follow-up.  She experiences orthopnea, which is alleviated by sleeping in a health chair. She cannot lay back fully but finds the chair comfortable for sleeping. No chest pain is reported.  She has been told she has calcification in her coronary arteries. Her LDL cholesterol is elevated at 179 mg/dL, and she takes Zetia  (ezetimibe ) 10 mg. She has a history of intolerance to statins, including Crestor  and Lipitor, which caused adverse effects.  Chronic lymphocytic leukemia is managed by oncology. She notes swelling of lymph nodes, particularly on the left side, which she monitors closely.  She has a history of breast cancer treated with radiation nine years ago, which was on the right side. She wonders if the radiation treatment is related to the current lymph node swelling on the left side.  She reports a decrease in appetite and portion sizes, stating she 'just don't have the taste for food that much.' Despite this, she maintains physical activity by using an Ellipse machine, walking a mile in the morning and another mile in the evening.      Studies Reviewed: .        Results LABS LDL: 179  DIAGNOSTIC Echocardiogram: EF 70%, mild left ventricular hypertrophy (LVH), moderate aortic valve calcification, mild regurgitation, no stenosis (03/2023) Risk Assessment/Calculations:           Physical Exam:   VS:  BP (!) 172/64   Pulse 63   Ht 5' 2.5  (1.588 m)   Wt 198 lb (89.8 kg)   LMP 06/08/1989   SpO2 97%   BMI 35.64 kg/m    Wt Readings from Last 3 Encounters:  03/26/24 198 lb (89.8 kg)  03/07/24 202 lb 8 oz (91.9 kg)  02/25/24 204 lb (92.5 kg)    GEN: Well nourished, well developed in no acute distress NECK: No JVD; No carotid bruits. Left neck lymph node CARDIAC: RRR, no murmurs, no rubs, no gallops RESPIRATORY:  Clear to auscultation without rales, wheezing or rhonchi  ABDOMEN: Soft, non-tender, non-distended EXTREMITIES:  No edema; No deformity   ASSESSMENT AND PLAN: .    Assessment and Plan Assessment & Plan Coronary artery disease with coronary calcification Significant coronary artery calcification with no current chest pain, but presence of plaque suggests coronary artery disease. Intolerance to statins such as Crestor  and Lipitor noted. - Emphasize importance of lowering LDL cholesterol to prevent further progression. - Discuss Repatha as a potential treatment option to lower LDL cholesterol. - Order nuclear stress test to assess for coronary blockages. - Schedule pharmacy visit to lipid clinic to discuss Repatha as an alternative to statins.  Aortic sclerosis with mild aortic regurgitation Moderate calcification of the aortic valve with mild regurgitation on echocardiogram from August 2024. No evidence of stenosis. Current symptoms do not indicate need for valve replacement.  Orthopnea Difficulty breathing when lying flat, requiring use of a health chair for sleep. Echocardiogram shows good heart pump function with no  severe abnormalities.  Hyperlipidemia LDL cholesterol level is 179 mg/dL despite current use of Zetia  10 mg. Intolerance to statins such as Crestor  and Lipitor noted. - Discuss Repatha as a potential treatment option to lower LDL cholesterol. - Schedule pharmacy visit to lipid clinic to discuss Repatha as an alternative to statins.       Informed Consent   Shared Decision Making/Informed  Consent The risks [chest pain, shortness of breath, cardiac arrhythmias, dizziness, blood pressure fluctuations, myocardial infarction, stroke/transient ischemic attack, nausea, vomiting, allergic reaction, radiation exposure, metallic taste sensation and life-threatening complications (estimated to be 1 in 10,000)], benefits (risk stratification, diagnosing coronary artery disease, treatment guidance) and alternatives of a nuclear stress test were discussed in detail with Jennifer Bartlett and she agrees to proceed.     Dispo: 1 yr  Signed, Oneil Parchment, MD

## 2024-03-27 ENCOUNTER — Encounter (HOSPITAL_COMMUNITY): Payer: Self-pay | Admitting: *Deleted

## 2024-04-01 ENCOUNTER — Ambulatory Visit: Payer: Self-pay | Admitting: Family Medicine

## 2024-04-01 DIAGNOSIS — E041 Nontoxic single thyroid nodule: Secondary | ICD-10-CM

## 2024-04-01 NOTE — Assessment & Plan Note (Signed)
 IMPRESSION: 1. Continued stability of TI-RADS category 3 nodule in the left lower gland. This exam documents 2 years of stability. Recommend continued annual follow-up until 5 years of stability at are confirmed.

## 2024-04-02 ENCOUNTER — Other Ambulatory Visit: Payer: Self-pay | Admitting: Cardiology

## 2024-04-02 ENCOUNTER — Telehealth: Payer: Self-pay | Admitting: *Deleted

## 2024-04-02 DIAGNOSIS — I2510 Atherosclerotic heart disease of native coronary artery without angina pectoris: Secondary | ICD-10-CM

## 2024-04-02 NOTE — Telephone Encounter (Signed)
 TCT patient regarding lab results. No answer but able to leave detailed vm on identified phone vm. Advised that her B12 level is very low and that Dr. Federico recommends weekly B12 injections x 4 weeks than monthly after that.  Dr. Federico also recommends f/u visit with labs in November 2025. Advised to call back with any questions or concerns to [phone]

## 2024-04-04 ENCOUNTER — Inpatient Hospital Stay: Attending: Hematology and Oncology

## 2024-04-04 ENCOUNTER — Ambulatory Visit (HOSPITAL_COMMUNITY)
Admission: RE | Admit: 2024-04-04 | Discharge: 2024-04-04 | Disposition: A | Discharge status: Home / Self Care | Source: Ambulatory Visit | Attending: Cardiology | Admitting: Cardiology

## 2024-04-04 DIAGNOSIS — I2510 Atherosclerotic heart disease of native coronary artery without angina pectoris: Secondary | ICD-10-CM | POA: Insufficient documentation

## 2024-04-04 DIAGNOSIS — E538 Deficiency of other specified B group vitamins: Secondary | ICD-10-CM | POA: Insufficient documentation

## 2024-04-04 LAB — MYOCARDIAL PERFUSION IMAGING
Base ST Depression (mm): 0 mm
LV dias vol: 79 mL (ref 46–106)
LV sys vol: 14 mL (ref 3.8–5.2)
Nuc Stress EF: 73 %
Peak HR: 92 {beats}/min
Rest HR: 75 {beats}/min
Rest Nuclear Isotope Dose: 9.6 mCi
SDS: 0
SRS: 2
SSS: 0
ST Depression (mm): 0 mm
Stress Nuclear Isotope Dose: 31.9 mCi
TID: 0.83

## 2024-04-04 MED ORDER — TECHNETIUM TC 99M TETROFOSMIN IV KIT
31.9000 | PACK | Freq: Once | INTRAVENOUS | Status: AC | PRN
  Administered 2024-04-04: 31.9 via INTRAVENOUS

## 2024-04-04 MED ORDER — REGADENOSON 0.4 MG/5ML IV SOLN
0.4000 mg | Freq: Once | INTRAVENOUS | Status: AC
  Administered 2024-04-04: 0.4 mg via INTRAVENOUS

## 2024-04-04 MED ORDER — TECHNETIUM TC 99M TETROFOSMIN IV KIT
9.6000 | PACK | Freq: Once | INTRAVENOUS | Status: AC | PRN
  Administered 2024-04-04: 9.6 via INTRAVENOUS

## 2024-04-04 MED ORDER — REGADENOSON 0.4 MG/5ML IV SOLN
INTRAVENOUS | Status: AC
  Filled 2024-04-04: qty 5

## 2024-04-04 NOTE — Progress Notes (Deleted)
 Called pt to check on her, due to her not showing for her B12 injection appointment today. No answer. Voicemail left w/ office number [phone].  This concludes the interaction.  Rosaline Minerva, LPN

## 2024-04-07 ENCOUNTER — Ambulatory Visit: Payer: Self-pay | Admitting: Cardiology

## 2024-04-11 ENCOUNTER — Inpatient Hospital Stay

## 2024-04-14 ENCOUNTER — Other Ambulatory Visit: Payer: Self-pay | Admitting: Physician Assistant

## 2024-04-14 ENCOUNTER — Ambulatory Visit: Payer: Self-pay | Admitting: Cardiology

## 2024-04-14 DIAGNOSIS — N6320 Unspecified lump in the left breast, unspecified quadrant: Secondary | ICD-10-CM

## 2024-04-14 NOTE — Progress Notes (Signed)
 Thank you for the heads up!

## 2024-04-15 ENCOUNTER — Ambulatory Visit
Admission: RE | Admit: 2024-04-15 | Discharge: 2024-04-15 | Disposition: A | Discharge status: Home / Self Care | Source: Ambulatory Visit | Attending: Physician Assistant | Admitting: Physician Assistant

## 2024-04-15 ENCOUNTER — Other Ambulatory Visit

## 2024-04-15 DIAGNOSIS — R92322 Mammographic fibroglandular density, left breast: Secondary | ICD-10-CM | POA: Diagnosis not present

## 2024-04-15 DIAGNOSIS — N6320 Unspecified lump in the left breast, unspecified quadrant: Secondary | ICD-10-CM

## 2024-04-15 DIAGNOSIS — R928 Other abnormal and inconclusive findings on diagnostic imaging of breast: Secondary | ICD-10-CM | POA: Diagnosis not present

## 2024-04-18 ENCOUNTER — Other Ambulatory Visit: Payer: Self-pay | Admitting: Physician Assistant

## 2024-04-18 ENCOUNTER — Inpatient Hospital Stay

## 2024-04-18 DIAGNOSIS — D509 Iron deficiency anemia, unspecified: Secondary | ICD-10-CM

## 2024-04-18 DIAGNOSIS — E538 Deficiency of other specified B group vitamins: Secondary | ICD-10-CM | POA: Diagnosis not present

## 2024-04-18 MED ORDER — CYANOCOBALAMIN 1000 MCG/ML IJ SOLN
1000.0000 ug | Freq: Once | INTRAMUSCULAR | Status: AC
  Administered 2024-04-18: 1000 ug via INTRAMUSCULAR
  Filled 2024-04-18: qty 1

## 2024-04-22 ENCOUNTER — Ambulatory Visit: Admitting: Cardiology

## 2024-04-25 ENCOUNTER — Inpatient Hospital Stay

## 2024-04-25 DIAGNOSIS — E538 Deficiency of other specified B group vitamins: Secondary | ICD-10-CM | POA: Diagnosis not present

## 2024-04-25 DIAGNOSIS — D509 Iron deficiency anemia, unspecified: Secondary | ICD-10-CM

## 2024-04-25 MED ORDER — CYANOCOBALAMIN 1000 MCG/ML IJ SOLN
1000.0000 ug | Freq: Once | INTRAMUSCULAR | Status: AC
  Administered 2024-04-25: 1000 ug via INTRAMUSCULAR
  Filled 2024-04-25: qty 1

## 2024-04-25 NOTE — Patient Instructions (Signed)
 Vitamin B12 Injection What is this medication? Vitamin B12 (VAHY tuh min B12) prevents and treats low vitamin B12 levels in your body. It is used in people who do not get enough vitamin B12 from their diet or when their digestive tract does not absorb enough. Vitamin B12 plays an important role in maintaining the health of your nervous system and red blood cells. This medicine may be used for other purposes; ask your health care provider or pharmacist if you have questions. COMMON BRAND NAME(S): B-12 Compliance Kit, B-12 Injection Kit, Cyomin, Dodex , LA-12, Nutri-Twelve, Physicians EZ Use B-12, Primabalt, Vitamin Deficiency Injectable System - B12 What should I tell my care team before I take this medication? They need to know if you have any of these conditions: Kidney disease Leber's disease Megaloblastic anemia An unusual or allergic reaction to cyanocobalamin , cobalt, other medications, foods, dyes, or preservatives Pregnant or trying to get pregnant Breast-feeding How should I use this medication? This medication is injected into a muscle or deeply under the skin. It is usually given in a clinic or care team's office. However, your care team may teach you how to inject yourself. Follow all instructions. Talk to your care team about the use of this medication in children. Special care may be needed. Overdosage: If you think you have taken too much of this medicine contact a poison control center or emergency room at once. NOTE: This medicine is only for you. Do not share this medicine with others. What if I miss a dose? If you are given your dose at a clinic or care team's office, call to reschedule your appointment. If you give your own injections, and you miss a dose, take it as soon as you can. If it is almost time for your next dose, take only that dose. Do not take double or extra doses. What may interact with this medication? Alcohol Colchicine This list may not describe all possible  interactions. Give your health care provider a list of all the medicines, herbs, non-prescription drugs, or dietary supplements you use. Also tell them if you smoke, drink alcohol, or use illegal drugs. Some items may interact with your medicine. What should I watch for while using this medication? Visit your care team regularly. You may need blood work done while you are taking this medication. You may need to follow a special diet. Talk to your care team. Limit your alcohol intake and avoid smoking to get the best benefit. What side effects may I notice from receiving this medication? Side effects that you should report to your care team as soon as possible: Allergic reactions--skin rash, itching, hives, swelling of the face, lips, tongue, or throat Swelling of the ankles, hands, or feet Trouble breathing Side effects that usually do not require medical attention (report to your care team if they continue or are bothersome): Diarrhea This list may not describe all possible side effects. Call your doctor for medical advice about side effects. You may report side effects to FDA at 1-800-FDA-1088. Where should I keep my medication? Keep out of the reach of children. Store at room temperature between 15 and 30 degrees C (59 and 85 degrees F). Protect from light. Throw away any unused medication after the expiration date. NOTE: This sheet is a summary. It may not cover all possible information. If you have questions about this medicine, talk to your doctor, pharmacist, or health care provider.  2024 Elsevier/Gold Standard (2021-03-29 00:00:00)

## 2024-04-28 ENCOUNTER — Encounter: Payer: Self-pay | Admitting: Physician Assistant

## 2024-04-28 ENCOUNTER — Other Ambulatory Visit (HOSPITAL_COMMUNITY): Payer: Self-pay

## 2024-04-28 ENCOUNTER — Ambulatory Visit: Attending: Internal Medicine | Admitting: Pharmacist

## 2024-04-28 ENCOUNTER — Encounter: Payer: Self-pay | Admitting: Pharmacist

## 2024-04-28 ENCOUNTER — Telehealth: Payer: Self-pay | Admitting: Pharmacy Technician

## 2024-04-28 DIAGNOSIS — E7800 Pure hypercholesterolemia, unspecified: Secondary | ICD-10-CM

## 2024-04-28 NOTE — Assessment & Plan Note (Addendum)
 Assessment:  Patient is currently not taking any cholesterol lowering medications and has not been for ~ 1 year Intolerance to atorvastatin, rosuvastatin  and ezetimibe  LDL goal: < 70 mg/dl; last LDLc 820 mg/dl (92/78/7974)  Patient expresses interest in starting Repatha after discussion of cost, dosing, efficacy, and side effects Provided and discussed heart healthy handout to support LDL lowering Emphasized importance of aerobic exercise   Plan: Will apply for PA for Repatha; will inform patient upon approval  Patient will need [employer] grant Rx to be sent to [facility] - 90 day supply Lipid lab due in 2-3 months after starting PCSK9i - Of note, patient is scheduled for labs on 06/20/24 at the cancer center; at that time we will request lipid panel to be drawn.

## 2024-04-28 NOTE — Telephone Encounter (Signed)
 Hi, grant is added to [employer]. Pa approved. If you can send the prescription to [place] please and thank you  Pharmacy Patient Advocate Encounter  Received notification from [facility] that Prior Authorization for repatha has been APPROVED from 04/28/24 to 10/26/24   PA #/Case ID/Reference #: [id number]

## 2024-04-28 NOTE — Telephone Encounter (Signed)
   Pharmacy Patient Advocate Encounter   Received notification from Pt Calls Messages that prior authorization for REPATHA is required/requested.   Insurance verification completed.   The patient is insured through [facility] .   Per test claim: PA required; PA submitted to above mentioned insurance via Latent Key/confirmation #/EOC [id number] Status is pending   Patient Advocate Encounter   The patient was approved for a [employer] grant that will help cover the cost of repatha Total amount awarded, 2500.  Effective: 03/29/24 - 03/28/25   APW:[id number] ERW:[id number] Hmnle:[id number] PI:[id number] [employer] ID: [id number]   Pharmacy provided with approval and processing information. Patient informed via mychart

## 2024-04-28 NOTE — Progress Notes (Signed)
 Patient ID: Jennifer Bartlett                 DOB: 1945-09-06                    MRN: [id number]      HPI: Jennifer Bartlett is a 78 y.o. female patient referred to lipid clinic by Dr. Jeffrie. PMH is significant for CAD, HLD, HTN, chronic lymphocytic leukemia, hypothyroidism, depression, and obesity.   Patient was recently seen by Dr. Jeffrie on 02/2024 and ECHO from 03/2023 shows evidence of moderate calcification of the aortic valve with mild regurgitation.    Patient presents today and states that she is not currently taking any cholesterol lowering medications. She stopped taking ezetimibe  approximately a year ago due to muscle aches and previously stopped taking statins for the same reason.  Patient acknowledges that her diet is not ideal and admits there is room for improvement. Her husband has diabetes and she herself is prediabetic. She makes efforts to eat healthy, but a family member prepares most meals and prepared meals are not always healthy. Her typical diet includes baked proteins, vegetables and mostly water with flavoring. She exercises on her sitting electric pedal cycle every day but receiving a good deal of assistance from the machine itself. She is unable to climb stairs due to knee pain which she is taking OTC Backaid for. She is willing to start walking in her yard three days per week for 20 minutes.   She mentions knowing someone who has used Repatha and tolerated it well and expresses willingness to try it. She is unsure if she has met her deductible yet but expresses interest in enrolling in the [employer].  Reviewed options for lowering LDL cholesterol, including PCSK-9 inhibitors. Discussed mechanisms of action, dosing, side effects and potential decreases in LDL cholesterol.  Also reviewed cost information and potential options for patient assistance.   Current Medications: none  Intolerances: rosuvastatin , atorvastatin, simvastatin, ezetimibe  (muscle aches) Risk  Factors: CAD, HTN LDL goal: less than 70 mg/dL   Diet:  Breakfast: scrambled eggs with protein; breakfast with bread and egg; black coffee or tea  Dinner: baked chicken, [place] sandwich, lettuce, cheese, zucchini, squash, fried okra Snacks: salty snacks, skinny popcorn Drinks: water 2 or 3 -16 oz bottles with splash flavoring   Exercise:  Sitting electric pedal - 1-2 miles per day    Family History:  Relation Problem Comments  Mother (Deceased) Heart disease MI  Hypertension     Father (Deceased) Alcohol abuse   Heart disease smoker  Hypertension     Maternal Aunt Diabetes     Paternal Grandmother (Deceased) Breast cancer       Social History:  Alcohol: none  Smoking: none  Labs:  Lipid Panel     Component Value Date/Time   CHOL 249 (H) 02/18/2024 0802   TRIG 124.0 02/18/2024 0802   HDL 46.00 02/18/2024 0802   CHOLHDL 5 02/18/2024 0802   VLDL 24.8 02/18/2024 0802   LDLCALC 179 (H) 02/18/2024 0802   LDLDIRECT 139.2 08/25/2013 0852    Past Medical History:  Diagnosis Date   Anemia    Anxiety    Arthritis    hands, knees   Breast cancer (HCC)    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     Biotin 10 MG TABS Take 10 mg by mouth in the morning and at bedtime.     CALCIUM  PO Take 1 tablet by mouth daily.     chlorthalidone  (HYGROTON ) 25 MG tablet Take 1 tablet by mouth once daily 90 tablet 0   Cholecalciferol (D3 VITAMIN PO) Take 4,000 Units by mouth  daily.     diclofenac Sodium (VOLTAREN) 1 % GEL Apply 2 g topically 4 (four) times daily as needed (pain).     levothyroxine  (SYNTHROID ) 75 MCG tablet TAKE 1 TABLET BY MOUTH ONCE DAILY BEFORE BREAKFAST 90 tablet 3   loratadine (CLARITIN) 10 MG tablet Take 10 mg by mouth daily as needed for allergies.     Multiple Vitamin (MULTIVITAMIN) capsule Take 1 capsule by mouth daily.     omeprazole  (PRILOSEC) 20 MG capsule TAKE 1 CAPSULE BY MOUTH TWICE DAILY BEFORE A MEAL 180 capsule 1   OVER THE COUNTER MEDICATION Take 1 capsule by mouth daily. InstaFlex     spironolactone  (ALDACTONE ) 25 MG tablet Take 0.5 tablets (12.5 mg total) by mouth daily. 45 tablet 3   No current facility-administered medications on file prior to visit.    Allergies  Allergen Reactions   Crestor  [Rosuvastatin  Calcium ] Other (See Comments)    Myalgia    Lipitor [Atorvastatin Calcium ] Other (See Comments)    Aches and pains   Penicillins Shortness Of Breath and Swelling   Zocor [Simvastatin - High Dose] Other (See Comments)    Muscle/joint aches   Ferrex 150 [Polysaccharide Iron  Complex]     Indigestion    Assessment/Plan:  1. Hyperlipidemia -  Problem  HYPERCHOLESTEROLEMIA   Qualifier: History of  By: Cecilie CMA, NANNIE Mitzie PECK Assessment:  Patient is currently not taking any cholesterol lowering medications and has not been for ~ 1 year Intolerance to atorvastatin, rosuvastatin  and ezetimibe  LDL goal: < 70 mg/dl; last LDLc 820 mg/dl (92/78/7974)  Patient expresses interest in starting Repatha after discussion of cost, dosing, efficacy, and side effects Provided and discussed heart healthy handout to support LDL lowering Emphasized importance of aerobic exercise   Plan: Will apply for PA for Repatha; will inform patient upon approval  Patient will need [employer] grant Rx to be sent to [facility] - 90 day supply Lipid lab due in 2-3 months after starting PCSK9i - Of  note, patient is scheduled for labs on 06/20/24 at the cancer center; at that time we will request lipid panel to be drawn.     Thank you,  Lakedra E. White, Pharm.D [name] [facility] [address], [place], [place] [address] Phone: [phone]; Fax: [phone]     Eleanor BIRCH Crews, Pharm.BIRCH SARAN, CPP [facility] of [facility] [address], [place], [place] [address]  Phone: [phone]; Fax: [phone]

## 2024-04-30 ENCOUNTER — Other Ambulatory Visit (HOSPITAL_COMMUNITY): Payer: Self-pay

## 2024-04-30 MED ORDER — REPATHA SURECLICK 140 MG/ML ~~LOC~~ SOAJ
140.0000 mg | SUBCUTANEOUS | 2 refills | Status: DC
  Filled 2024-04-30: qty 2, 28d supply, fill #0
  Filled 2024-05-26: qty 2, 28d supply, fill #1
  Filled 2024-06-22: qty 2, 28d supply, fill #2

## 2024-04-30 NOTE — Addendum Note (Signed)
 Addended by: Marleen Moret E on: 04/30/2024 08:24 AM   Modules accepted: Orders

## 2024-05-05 ENCOUNTER — Other Ambulatory Visit (HOSPITAL_COMMUNITY): Payer: Self-pay

## 2024-05-06 ENCOUNTER — Telehealth: Payer: Self-pay | Admitting: Hematology and Oncology

## 2024-05-06 NOTE — Telephone Encounter (Signed)
 I returned Jennifer Bartlett's call from a voicemail received on 9/30. Danaka called to inquire about her appointments. I informed her that her next appointment is scheduled for 10/24.

## 2024-05-12 ENCOUNTER — Other Ambulatory Visit: Payer: Self-pay | Admitting: Family Medicine

## 2024-05-23 ENCOUNTER — Inpatient Hospital Stay: Attending: Hematology and Oncology

## 2024-05-23 DIAGNOSIS — D509 Iron deficiency anemia, unspecified: Secondary | ICD-10-CM

## 2024-05-23 DIAGNOSIS — E538 Deficiency of other specified B group vitamins: Secondary | ICD-10-CM | POA: Insufficient documentation

## 2024-05-23 MED ORDER — CYANOCOBALAMIN 1000 MCG/ML IJ SOLN
1000.0000 ug | Freq: Once | INTRAMUSCULAR | Status: AC
  Administered 2024-05-23: 1000 ug via INTRAMUSCULAR
  Filled 2024-05-23: qty 1

## 2024-05-28 ENCOUNTER — Telehealth: Payer: Self-pay | Admitting: Cardiology

## 2024-05-28 NOTE — Telephone Encounter (Signed)
 Pt c/o medication issue:  1. Name of Medication:   Evolocumab (REPATHA SURECLICK) 140 MG/ML SOAJ    2. How are you currently taking this medication (dosage and times per day)?  Inject 140 mg into the skin every 14 (fourteen) days.      3. Are you having a reaction (difficulty breathing--STAT)? No  4. What is your medication issue? Pt is requesting a callback regarding her stating this medication is causing her to not have an appetite and when she does eat, she can't eat much because then her chest will feel heavy. She stated sometimes her bp has went higher than usual at times so she'd like to know what to do. Please advise

## 2024-05-28 NOTE — Telephone Encounter (Signed)
 Returned patient's call, no answer. Left detailed VM per DPR asking patient to call our office to discuss her concerns about repatha.

## 2024-05-29 ENCOUNTER — Other Ambulatory Visit: Payer: Self-pay | Admitting: Cardiology

## 2024-05-29 ENCOUNTER — Other Ambulatory Visit: Payer: Self-pay | Admitting: Family Medicine

## 2024-05-30 NOTE — Telephone Encounter (Signed)
 Tried to call but VM full and no answer

## 2024-06-16 ENCOUNTER — Other Ambulatory Visit: Payer: Self-pay | Admitting: Hematology and Oncology

## 2024-06-16 DIAGNOSIS — D509 Iron deficiency anemia, unspecified: Secondary | ICD-10-CM

## 2024-06-16 NOTE — Telephone Encounter (Signed)
 I spoke with Dr. Federico to request that a lipid panel be included with the other lab work scheduled at the cancer center on 11/21. The lipid panel has been ordered, will follow up once the results are available.

## 2024-06-19 NOTE — Progress Notes (Unsigned)
 [facility] [phone]   [phone]  PROGRESS NOTE  Patient Care Team: Tower, Laine LABOR, MD as PCP - General Jeffrie Oneil BROCKS, MD as PCP - Cardiology (Cardiology) Neysa Inocente PARAS, NP (Inactive) as Nurse Practitioner (Obstetrics and Gynecology) Aron Shoulders, MD as Consulting Physician (General Surgery) Tyree Nanetta SAILOR, RN as Registered Nurse Moses, Powell Hummer, NP as Nurse Practitioner (Nurse Practitioner) Rubie Kemps, MD as Consulting Physician (Orthopedic Surgery)  CHIEF COMPLAINTS/PURPOSE OF CONSULTATION:  -Iron  deficiency anemia -History of right breast cancer -Left axillary lymphadenopathy--biopsy-proven CLL/SLL  HISTORY OF PRESENTING ILLNESS:  Jennifer Bartlett Asante 78 y.o. female returns for a follow up for CLL/SLL and iron  deficiency anemia. She was last seen on 03/07/2024. She is unaccompanied for this visit.   On exam today, Jennifer Bartlett reports she has been well overall in the interim since our last visit.  She reports she is tolerating the vitamin B-12 injections well with no itching, rash or redness at the site of injection.  She reports that she is feeling more energized and considerably better.  She reports that they really help.  She reports that she is looking forward to Thanksgiving this year as her grandson is visiting from [place] where he is stationed in the [employer].  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  She notes that otherwise she is doing quite well and is not having any lightheadedness, dizziness, shortness of breath.  She denies any fevers, chills, sweats.  She reports that she has been having weight go up and down slightly but no major shifts in her weight.  Overall she feels well and is willing and able to continue on vitamin B12 shots at this time.  Full 10 point ROS otherwise negative.  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 (HCC) 02/26/2015   Depression    Family history of adverse reaction to anesthesia    mother has had PONV   GERD (gastroesophageal reflux disease)    Hot flashes    Hyperlipidemia    Hypertension    Hypoglycemia    Hypothyroidism    Obesity    Personal history of radiation therapy    Vitamin B 12 deficiency     SURGICAL HISTORY: Past Surgical History:  Procedure Laterality Date   ABDOMINAL HYSTERECTOMY     BREAST BIOPSY     BREAST LUMPECTOMY Right    2016   BREAST REDUCTION SURGERY Bilateral 09/14/2020   Procedure: MAMMARY REDUCTION  (BREAST);  Surgeon: Arelia Filippo, MD;  Location: [facility];  Service: Plastics;  Laterality: Bilateral;   GASTRIC BYPASS  08/2003   KNEE SURGERY     left   PARTIAL HYSTERECTOMY  1990's   bleeding   RADIOACTIVE SEED GUIDED PARTIAL MASTECTOMY WITH AXILLARY SENTINEL LYMPH NODE BIOPSY Right 03/09/2015   Procedure: RADIOACTIVE SEED GUIDED RIGHT PARTIAL MASTECTOMY WITH AXILLARY SENTINEL LYMPH NODE BIOPSY;  Surgeon: Shoulders Aron, MD;  Location: [facility];  Service: General;  Laterality: Right;   REDUCTION MAMMAPLASTY Bilateral 08/2020   TUBAL LIGATION      SOCIAL HISTORY: Social History   Socioeconomic History   Marital status: Married    Spouse name: Not on file   Number of children: 3   Years of education: Not on file   Highest  education level: Not on file  Occupational History    Employer: [employer]  Tobacco Use   Smoking status: Never   Smokeless tobacco: Never  Vaping Use   Vaping status: Never Used  Substance and Sexual Activity   Alcohol use: No   Drug use: No   Sexual activity: Yes    Partners: Male    Birth control/protection: Surgical    Comment: Hyst.  Other Topics Concern   Not on file  Social History Narrative   Regular walking for exercise.   Social Drivers of Corporate Investment Banker Strain: Low Risk  (07/10/2023)    Overall Financial Resource Strain (CARDIA)    Difficulty of Paying Living Expenses: Not hard at all  Food Insecurity: No Food Insecurity (07/10/2023)   Hunger Vital Sign    Worried About Running Out of Food in the Last Year: Never true    Ran Out of Food in the Last Year: Never true  Transportation Needs: No Transportation Needs (07/10/2023)   PRAPARE - Administrator, Civil Service (Medical): No    Lack of Transportation (Non-Medical): No  Physical Activity: Sufficiently Active (07/10/2023)   Exercise Vital Sign    Days of Exercise per Week: 3 days    Minutes of Exercise per Session: 60 min  Stress: No Stress Concern Present (07/10/2023)   [employer] of Occupational Health - Occupational Stress Questionnaire    Feeling of Stress : Not at all  Social Connections: Socially Integrated (07/10/2023)   Social Connection and Isolation Panel    Frequency of Communication with Friends and Family: More than three times a week    Frequency of Social Gatherings with Friends and Family: More than three times a week    Attends Religious Services: More than 4 times per year    Active Member of [employer] or Organizations: Yes    Attends Engineer, Structural: More than 4 times per year    Marital Status: Married  Catering Manager Violence: Not At Risk (07/10/2023)   Humiliation, Afraid, Rape, and Kick questionnaire    Fear of Current or Ex-Partner: No    Emotionally Abused: No    Physically Abused: No    Sexually Abused: No    FAMILY HISTORY: Family History  Problem Relation Age of Onset   Heart disease Mother        MI   Hypertension Mother    Alcohol abuse Father    Heart disease Father        smoker   Hypertension Father    Breast cancer Paternal Grandmother    Diabetes Maternal Aunt     ALLERGIES:  is allergic to crestor  [rosuvastatin  calcium ], lipitor [atorvastatin calcium ], penicillins, zocor [simvastatin - high dose], and ferrex 150 [polysaccharide iron   complex].  MEDICATIONS:  Current Outpatient Medications  Medication Sig Dispense Refill   Acetaminophen -Pamabrom (BACKAID MAX) 500-25 MG TABS Take 2 capsules by mouth daily.     Biotin 10 MG TABS Take 10 mg by mouth in the morning and at bedtime.     CALCIUM  PO Take 1 tablet by mouth daily.     chlorthalidone  (HYGROTON ) 25 MG tablet Take 1 tablet by mouth once daily 90 tablet 2   Cholecalciferol (D3 VITAMIN PO) Take 4,000 Units by mouth daily.     diclofenac Sodium (VOLTAREN) 1 % GEL Apply 2 g topically 4 (four) times daily as needed (pain).     Evolocumab  (REPATHA  SURECLICK) 140 MG/ML SOAJ Inject 140 mg  into the skin every 14 (fourteen) days. 2 mL 2   levothyroxine  (SYNTHROID ) 75 MCG tablet TAKE 1 TABLET BY MOUTH ONCE DAILY BEFORE BREAKFAST 90 tablet 2   loratadine (CLARITIN) 10 MG tablet Take 10 mg by mouth daily as needed for allergies.     Multiple Vitamin (MULTIVITAMIN) capsule Take 1 capsule by mouth daily.     omeprazole  (PRILOSEC) 20 MG capsule TAKE 1 CAPSULE BY MOUTH TWICE DAILY BEFORE A MEAL 180 capsule 1   OVER THE COUNTER MEDICATION Take 1 capsule by mouth daily. InstaFlex     spironolactone  (ALDACTONE ) 25 MG tablet Take 1/2 (one-half) tablet by mouth once daily 45 tablet 2   No current facility-administered medications for this visit.    REVIEW OF SYSTEMS:   Constitutional: ( - ) fevers, ( - )  chills , ( - ) night sweats Eyes: ( - ) blurriness of vision, ( - ) double vision, ( - ) watery eyes Ears, nose, mouth, throat, and face: ( - ) mucositis, ( - ) sore throat Respiratory: ( - ) cough, (- ) dyspnea, ( - ) wheezes Cardiovascular: ( - ) palpitation, ( - ) chest discomfort, ( - ) lower extremity swelling Gastrointestinal:  ( - ) nausea, ( - ) heartburn, ( - ) change in bowel habits Skin: ( - ) abnormal skin rashes Lymphatics: ( + ) new lymphadenopathy, ( - ) easy bruising Neurological: ( - ) numbness, ( - ) tingling, ( - ) new weaknesses Behavioral/Psych: ( - ) mood  change, ( - ) new changes  All other systems were reviewed with the patient and are negative.  PHYSICAL EXAMINATION: ECOG PERFORMANCE STATUS: 1 - Symptomatic but completely ambulatory  Vitals:   06/20/24 1101  BP: 126/70  Pulse: 66  Resp: 13  Temp: 97.9 F (36.6 C)  SpO2: 99%      Filed Weights   06/20/24 1101  Weight: 205 lb 8 oz (93.2 kg)      GENERAL: well appearing female in NAD  SKIN: skin color, texture, turgor are normal, no rashes or significant lesions EYES: conjunctiva are pink and non-injected, sclera clear LYMPH:  no palpable lymphadenopathy in the cervical, supraclavicular lymph nodes. Subcentimeter lymph nodes appreciated posterior cervical region bilaterally. LUNGS: clear to auscultation and percussion with normal breathing effort HEART: regular rate & rhythm and no murmurs and no lower extremity edema Musculoskeletal: no cyanosis of digits and no clubbing  PSYCH: alert & oriented x 3, fluent speech NEURO: no focal motor/sensory deficits  LABORATORY DATA:  I have reviewed the data as listed    Latest Ref Rng & Units 06/20/2024   10:06 AM 03/07/2024    9:46 AM 02/18/2024    8:02 AM  CBC  WBC 4.0 - 10.5 K/uL 5.3  7.1  6.2   Hemoglobin 12.0 - 15.0 g/dL 87.1  87.0  87.2   Hematocrit 36.0 - 46.0 % 38.6  39.7  39.3   Platelets 150 - 400 K/uL 330  324  345.0        Latest Ref Rng & Units 06/20/2024   10:06 AM 03/07/2024    9:46 AM 02/18/2024    8:02 AM  CMP  Glucose 70 - 99 mg/dL 889  891  897   BUN 8 - 23 mg/dL 18  24  22    Creatinine 0.44 - 1.00 mg/dL 9.00  8.91  9.05   Sodium 135 - 145 mmol/L 136  137  138   Potassium 3.5 -  5.1 mmol/L 4.2  3.7  4.4   Chloride 98 - 111 mmol/L 99  100  99   CO2 22 - 32 mmol/L 28  30  30    Calcium  8.9 - 10.3 mg/dL 9.4  9.1  9.4   Total Protein 6.5 - 8.1 g/dL 7.2  7.5  6.9   Total Bilirubin 0.0 - 1.2 mg/dL 0.5  0.4  0.4   Alkaline Phos 38 - 126 U/L 84  84  77   AST 15 - 41 U/L 20  16  16    ALT 0 - 44 U/L 11  11  12       ASSESSMENT & PLAN RAMONICA GRIGG is a 77 y.o. female returns for follow-up.  #Iron  deficiency anemia: --Secondary to malasorbption due to bariatric surgery --Placed referral to GI but consultation never took place. Consider re-referral if iron  deficiency reoccurs.  --Received IV feraheme  510 mg x 2 doses on 01/18/2022 and 01/25/2022.  --Labs show WBC 5.3, Hgb 12.8, MCV 91.7, Plt 330. Iron  levels pending.  --No need for additional IV iron  infusions at this time.   #H/O bariatric surgery: # Vitamin B12 Deficiency --B12 levels low on 03/07/2024 (elevated MMA)  --Recommend continued every 4 week vitamin B12 injections. --Continue to monitor  #H/O right breast cancer: --Medical oncologist was Dr. Layla. Completed treatment in October 2016 and completed 5 years of Tamoxifen  therapy in January 2022.  --Currently on surveillance with PCP.  --Last mammogram was 07/30/2023 which was negative for malignancy. Next scheduled on 07/30/2024.   #Left breast mass and left axillary lymphadenopathy: # CLL Rai Stage II  --CT chest from 04/07/2022 showed numerous enlarged and nonenlarged left axillary lymph nodes measuring up to 16 mm short axis --Physical exam findings were concerning for palpable mass in the outer lower quadrant of the left breast.  -- left axillary US  with biopsy on 04/28/2023 confirmed CLL/SLL -- No indication to start treatment at this time with no evidence of cytopenias or B-symptoms.   Follow up: -- 3 months: labs and follow up with interval every 4 week B12 injections  No orders of the defined types were placed in this encounter.   All questions were answered. The patient knows to call the clinic with any problems, questions or concerns.  I have spent a total of 30 minutes minutes of face-to-face and non-face-to-face time, preparing to see the patient, performing a medically appropriate examination, counseling and educating the patient, ordering  medications/tests/procedures, documenting clinical information in the electronic health record, independently interpreting results and communicating results to the patient, and care coordination.   Norleen IVAR Kidney, MD Department of Hematology/Oncology [facility] Phone: [phone] Pager: [phone] Email: [email or web address]  Shermon HERO, Cheson BD, Catovsky D, Caligaris-Cappio F, Dighiero G, Dhner H, Hillmen P, Keating M, Montserrat E, Chiorazzi N, Stilgenbauer S, Rai KR, [place], Eichhorst B, O'Brien S, Robak T, Seymour JF, Darmstadt UG. iwCLL guidelines for diagnosis, indications for treatment, response assessment, and supportive management of CLL. Blood. 2018 Jun 21;131(25):2745-2760.  Active disease should be clearly documented to initiate therapy. At least 1 of the following criteria should be met.  1) Evidence of progressive marrow failure as manifested by the development of, or worsening of, anemia and/or thrombocytopenia. Cutoff levels of Hb <10 g/dL or platelet counts <899  109/L are generally regarded as indication for treatment. However, in some patients, platelet counts <100  109/L may remain stable over a long period; this situation does not automatically  require therapeutic intervention. 2) Massive (ie, >=6 cm below the left costal margin) or progressive or symptomatic splenomegaly. 3) Massive nodes (ie, >=10 cm in longest diameter) or progressive or symptomatic lymphadenopathy. 4) Progressive lymphocytosis with an increase of >=50% over a 48-month period, or lymphocyte doubling time (LDT) <6 months. LDT can be obtained by linear regression extrapolation of absolute lymphocyte counts obtained at intervals of 2 weeks over an observation period of 2 to 3 months; patients with initial blood lymphocyte counts <30  109/L may require a longer observation period to determine the LDT. Factors contributing to lymphocytosis other than CLL (eg,  infections, steroid administration) should be excluded. 5) Autoimmune complications including anemia or thrombocytopenia poorly responsive to corticosteroids. 6) Symptomatic or functional extranodal involvement (eg, skin, kidney, lung, spine). Disease-related symptoms as defined by any of the following: Unintentional weight loss >=10% within the previous 6 months. Significant fatigue (ie, ECOG performance scale 2 or worse; cannot work or unable to perform usual activities). Fevers >=100.4F or 38.0C for 2 or more weeks without evidence of infection. Night sweats for >=1 month without evidence of infection.

## 2024-06-20 ENCOUNTER — Inpatient Hospital Stay

## 2024-06-20 ENCOUNTER — Inpatient Hospital Stay: Admitting: Hematology and Oncology

## 2024-06-20 ENCOUNTER — Inpatient Hospital Stay: Attending: Hematology and Oncology

## 2024-06-20 VITALS — BP 126/70 | HR 66 | Temp 97.9°F | Resp 13 | Wt 205.5 lb

## 2024-06-20 DIAGNOSIS — E538 Deficiency of other specified B group vitamins: Secondary | ICD-10-CM | POA: Diagnosis present

## 2024-06-20 DIAGNOSIS — D509 Iron deficiency anemia, unspecified: Secondary | ICD-10-CM | POA: Insufficient documentation

## 2024-06-20 DIAGNOSIS — C9110 Chronic lymphocytic leukemia of B-cell type not having achieved remission: Secondary | ICD-10-CM | POA: Insufficient documentation

## 2024-06-20 DIAGNOSIS — E785 Hyperlipidemia, unspecified: Secondary | ICD-10-CM | POA: Insufficient documentation

## 2024-06-20 DIAGNOSIS — N6320 Unspecified lump in the left breast, unspecified quadrant: Secondary | ICD-10-CM | POA: Diagnosis not present

## 2024-06-20 DIAGNOSIS — C8300 Small cell B-cell lymphoma, unspecified site: Secondary | ICD-10-CM

## 2024-06-20 DIAGNOSIS — Z853 Personal history of malignant neoplasm of breast: Secondary | ICD-10-CM | POA: Insufficient documentation

## 2024-06-20 LAB — CBC WITH DIFFERENTIAL (CANCER CENTER ONLY)
Abs Immature Granulocytes: 0.01 K/uL (ref 0.00–0.07)
Basophils Absolute: 0.1 K/uL (ref 0.0–0.1)
Basophils Relative: 2 %
Eosinophils Absolute: 0.2 K/uL (ref 0.0–0.5)
Eosinophils Relative: 4 %
HCT: 38.6 % (ref 36.0–46.0)
Hemoglobin: 12.8 g/dL (ref 12.0–15.0)
Immature Granulocytes: 0 %
Lymphocytes Relative: 22 %
Lymphs Abs: 1.2 K/uL (ref 0.7–4.0)
MCH: 30.4 pg (ref 26.0–34.0)
MCHC: 33.2 g/dL (ref 30.0–36.0)
MCV: 91.7 fL (ref 80.0–100.0)
Monocytes Absolute: 0.4 K/uL (ref 0.1–1.0)
Monocytes Relative: 7 %
Neutro Abs: 3.4 K/uL (ref 1.7–7.7)
Neutrophils Relative %: 65 %
Platelet Count: 330 K/uL (ref 150–400)
RBC: 4.21 MIL/uL (ref 3.87–5.11)
RDW: 13.3 % (ref 11.5–15.5)
WBC Count: 5.3 K/uL (ref 4.0–10.5)
nRBC: 0 % (ref 0.0–0.2)

## 2024-06-20 LAB — RETIC PANEL
Immature Retic Fract: 7.1 % (ref 2.3–15.9)
RBC.: 4.18 MIL/uL (ref 3.87–5.11)
Retic Count, Absolute: 45.6 K/uL (ref 19.0–186.0)
Retic Ct Pct: 1.1 % (ref 0.4–3.1)
Reticulocyte Hemoglobin: 35 pg (ref >27.9)

## 2024-06-20 LAB — CMP (CANCER CENTER ONLY)
ALT: 11 U/L (ref 0–44)
AST: 20 U/L (ref 15–41)
Albumin: 4.2 g/dL (ref 3.5–5.0)
Alkaline Phosphatase: 84 U/L (ref 38–126)
Anion gap: 9 (ref 5–15)
BUN: 18 mg/dL (ref 8–23)
CO2: 28 mmol/L (ref 22–32)
Calcium: 9.4 mg/dL (ref 8.9–10.3)
Chloride: 99 mmol/L (ref 98–111)
Creatinine: 0.99 mg/dL (ref 0.44–1.00)
GFR, Estimated: 58 mL/min — ABNORMAL LOW (ref >60)
Glucose, Bld: 110 mg/dL — ABNORMAL HIGH (ref 70–99)
Potassium: 4.2 mmol/L (ref 3.5–5.1)
Sodium: 136 mmol/L (ref 135–145)
Total Bilirubin: 0.5 mg/dL (ref 0.0–1.2)
Total Protein: 7.2 g/dL (ref 6.5–8.1)

## 2024-06-20 LAB — LACTATE DEHYDROGENASE: LDH: 160 U/L (ref 105–235)

## 2024-06-20 LAB — FERRITIN: Ferritin: 67 ng/mL (ref 11–307)

## 2024-06-20 LAB — LIPID PANEL
Cholesterol: 191 mg/dL (ref 0–200)
HDL: 50 mg/dL (ref >40)
LDL Cholesterol: 118 mg/dL — ABNORMAL HIGH (ref 0–99)
Total CHOL/HDL Ratio: 3.8 ratio
Triglycerides: 114 mg/dL (ref <150)
VLDL: 23 mg/dL (ref 0–40)

## 2024-06-20 LAB — IRON AND IRON BINDING CAPACITY (CC-WL,HP ONLY)
Iron: 101 ug/dL (ref 28–170)
Saturation Ratios: 28 % (ref 10.4–31.8)
TIBC: 367 ug/dL (ref 250–450)
UIBC: 266 ug/dL

## 2024-06-20 LAB — VITAMIN B12: Vitamin B-12: 799 pg/mL (ref 180–914)

## 2024-06-20 MED ORDER — CYANOCOBALAMIN 1000 MCG/ML IJ SOLN
1000.0000 ug | Freq: Once | INTRAMUSCULAR | Status: AC
  Administered 2024-06-20: 1000 ug via INTRAMUSCULAR
  Filled 2024-06-20: qty 1

## 2024-06-21 ENCOUNTER — Encounter: Payer: Self-pay | Admitting: Physician Assistant

## 2024-06-22 ENCOUNTER — Other Ambulatory Visit (HOSPITAL_COMMUNITY): Payer: Self-pay

## 2024-06-23 ENCOUNTER — Telehealth: Payer: Self-pay

## 2024-06-23 ENCOUNTER — Other Ambulatory Visit (HOSPITAL_COMMUNITY): Payer: Self-pay

## 2024-06-23 DIAGNOSIS — E7800 Pure hypercholesterolemia, unspecified: Secondary | ICD-10-CM

## 2024-06-23 NOTE — Telephone Encounter (Signed)
 Attempted to contact patient to review recent lipid panel results following initiation of Repatha . NR-LVM.

## 2024-06-24 NOTE — Telephone Encounter (Addendum)
 Second attempt to contact patient to review lipid panel NR-LVM, Attempt #2 of 3. Called the only # on file.

## 2024-06-25 NOTE — Telephone Encounter (Signed)
 Patient returned Pharmacist call.

## 2024-06-25 NOTE — Telephone Encounter (Signed)
 3rd and final attempt to call patient to discuss lipid panel results. NR.

## 2024-06-30 ENCOUNTER — Other Ambulatory Visit (HOSPITAL_COMMUNITY): Payer: Self-pay

## 2024-06-30 MED ORDER — REPATHA SURECLICK 140 MG/ML ~~LOC~~ SOAJ
140.0000 mg | SUBCUTANEOUS | 2 refills | Status: AC
  Filled 2024-06-30 – 2024-07-18 (×2): qty 6, 84d supply, fill #0

## 2024-06-30 NOTE — Telephone Encounter (Signed)
 Discussed recent lipid panel results with patient. LDL decreased from 179 to 881 (~41% reduction) since starting therapy. Expected reduction is ~60%, so assessed adherence to rule out nonresponse. Patient reports missing one dose by a week but otherwise taking medication as prescribed, with no issues and good tolerance. Plan: continue current therapy and repeat lipid panel in 3-4 months. Will reach out closer to that time to coordinate labs with oncology center if possible. Patient due for next refill 12/31, will go ahead and send refill to pharmacy.

## 2024-06-30 NOTE — Addendum Note (Signed)
 Addended by: Kahle Mcqueen E on: 06/30/2024 10:55 AM   Modules accepted: Orders

## 2024-07-10 ENCOUNTER — Ambulatory Visit (INDEPENDENT_AMBULATORY_CARE_PROVIDER_SITE_OTHER): Admitting: Family Medicine

## 2024-07-10 DIAGNOSIS — Z0000 Encounter for general adult medical examination without abnormal findings: Secondary | ICD-10-CM | POA: Diagnosis not present

## 2024-07-10 NOTE — Progress Notes (Signed)
 ----------------------------------------------------------------------------------------------------------------------------------------------------------------------------------------------------------------------  Because this visit was a virtual/telehealth visit, some criteria may be missing or patient reported. Any vitals not documented were not able to be obtained and vitals that have been documented are patient reported.    MEDICARE ANNUAL PREVENTIVE VISIT WITH PROVIDER: (Welcome to Medicare, initial annual wellness or annual wellness exam)  Virtual Visit via Phone Note  I connected with Jennifer Bartlett on 07/10/2024 by phone and verified that I am speaking with the correct person using two identifiers.  Location patient: home Location provider:work or home office Persons participating in the virtual visit: patient, provider  Concerns and/or follow up today: detailed intake and risks/health assessment completed in flowsheets and below - please see for details. Reports all has been pretty stable. Dr. Jeffrie did try her on a new medication for the cholesterol - Repatha . Reports is doing well so far.   How often do you have a drink containing alcohol?n How many drinks containing alcohol do you have on a typical day when you are drinking?na How often do you have six or more drinks on one occasion?na Have you ever smoked?n Quit date if applicable? na  How many packs a day do/did you smoke? na Do you use smokeless tobacco?n Do you use an illicit drugs?n Do you feel safe at home?n Last dentist visit? Has dentures Last eye Exam and location?going about every 6-12 months,    See HM section in Epic for other details of completed HM.    ROS: negative for report of fevers, unintentional weight loss, vision changes, vision loss, hearing loss or change, chest pain, sob, hemoptysis, melena, hematochezia, hematuria or bleeding or bruising  Patient-completed extensive health risk  assessment - reviewed and discussed with the patient: See Health Risk Assessment completed with patient prior to the visit either above or in recent phone note. This was reviewed in detailed with the patient today and appropriate recommendations, orders and referrals were placed as needed per Summary below and patient instructions.   Review of Medical History: -PMH, PSH, Family History and current specialty and care providers reviewed and updated and listed below   Patient Care Team: Tower, Laine LABOR, MD as PCP - General Jeffrie Oneil BROCKS, MD as PCP - Cardiology (Cardiology) Neysa Inocente PARAS, NP (Inactive) as Nurse Practitioner (Obstetrics and Gynecology) Aron Shoulders, MD as Consulting Physician (General Surgery) Tyree Nanetta SAILOR, RN as Registered Nurse Moses, Powell Hummer, NP as Nurse Practitioner (Nurse Practitioner) Rubie Kemps, MD as Consulting Physician (Orthopedic Surgery) Leonce Longs, OD (Optometry)   Past Medical History:  Diagnosis Date   Anemia    Anxiety    Arthritis    hands, knees   Breast cancer (HCC)    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   Depression    Family history of adverse reaction to anesthesia    mother has had PONV   GERD (gastroesophageal reflux disease)    Hot flashes    Hyperlipidemia    Hypertension    Hypoglycemia    Hypothyroidism    Obesity    Personal history of radiation therapy    Vitamin B 12 deficiency     Past Surgical History:  Procedure Laterality Date   ABDOMINAL HYSTERECTOMY     BREAST BIOPSY     BREAST LUMPECTOMY Right    2016   BREAST REDUCTION SURGERY Bilateral 09/14/2020   Procedure: MAMMARY REDUCTION  (BREAST);  Surgeon: Arelia Filippo, MD;  Location: [facility];  Service: Plastics;  Laterality: Bilateral;   GASTRIC BYPASS  08/2003   KNEE SURGERY     left   PARTIAL HYSTERECTOMY  1990's   bleeding   RADIOACTIVE SEED GUIDED PARTIAL MASTECTOMY WITH AXILLARY  SENTINEL LYMPH NODE BIOPSY Right 03/09/2015   Procedure: RADIOACTIVE SEED GUIDED RIGHT PARTIAL MASTECTOMY WITH AXILLARY SENTINEL LYMPH NODE BIOPSY;  Surgeon: Jina Nephew, MD;  Location: [facility];  Service: General;  Laterality: Right;   REDUCTION MAMMAPLASTY Bilateral 08/2020   TUBAL LIGATION      Social History   Socioeconomic History   Marital status: Married    Spouse name: Not on file   Number of children: 3   Years of education: Not on file   Highest education level: Not on file  Occupational History    Employer: [employer]  Tobacco Use   Smoking status: Never   Smokeless tobacco: Never  Vaping Use   Vaping status: Never Used  Substance and Sexual Activity   Alcohol use: No   Drug use: No   Sexual activity: Yes    Partners: Male    Birth control/protection: Surgical    Comment: Hyst.  Other Topics Concern   Not on file  Social History Narrative   Regular walking for exercise.   Social Drivers of Health   Tobacco Use: Low Risk (04/28/2024)   Patient History    Smoking Tobacco Use: Never    Smokeless Tobacco Use: Never    Passive Exposure: Not on file  Financial Resource Strain: Low Risk (07/10/2023)   Overall Financial Resource Strain (CARDIA)    Difficulty of Paying Living Expenses: Not hard at all  Food Insecurity: No Food Insecurity (07/10/2023)   Hunger Vital Sign    Worried About Running Out of Food in the Last Year: Never true    Ran Out of Food in the Last Year: Never true  Transportation Needs: No Transportation Needs (07/10/2023)   PRAPARE - Administrator, Civil Service (Medical): No    Lack of Transportation (Non-Medical): No  Physical Activity: Unknown (07/10/2024)   Exercise Vital Sign    Days of Exercise per Week: Not on file    Minutes of Exercise per Session: 30 min  Stress: No Stress Concern Present (07/10/2024)   [employer] of Occupational Health - Occupational Stress Questionnaire    Feeling of  Stress: Not at all  Social Connections: Socially Integrated (07/10/2024)   Social Connection and Isolation Panel    Frequency of Communication with Friends and Family: More than three times a week    Frequency of Social Gatherings with Friends and Family: More than three times a week    Attends Religious Services: More than 4 times per year    Active Member of Clubs or Organizations: Yes    Attends Banker Meetings: More than 4 times per year    Marital Status: Married  Catering Manager Violence: Not At Risk (07/10/2023)   Humiliation, Afraid, Rape, and Kick questionnaire    Fear of Current or Ex-Partner: No    Emotionally Abused: No    Physically Abused: No    Sexually Abused: No  Depression (PHQ2-9): Low Risk (07/10/2024)   Depression (PHQ2-9)    PHQ-2 Score: 0  Alcohol Screen: Low Risk (07/10/2023)   Alcohol Screen    Last Alcohol Screening Score (AUDIT): 0  Housing: Low Risk (07/10/2023)   Housing    Last Housing Risk Score: 0  Utilities: Not At Risk (07/10/2023)   AHC Utilities  Threatened with loss of utilities: No  Health Literacy: Adequate Health Literacy (07/10/2023)   B1300 Health Literacy    Frequency of need for help with medical instructions: Never    Family History  Problem Relation Age of Onset   Heart disease Mother        MI   Hypertension Mother    Alcohol abuse Father    Heart disease Father        smoker   Hypertension Father    Breast cancer Paternal Grandmother    Diabetes Maternal Aunt     Medications Ordered Prior to Encounter[1]  Allergies[2]     Physical Exam Vitals requested from patient and listed below if patient had equipment and was able to obtain at home for this virtual visit: There were no vitals filed for this visit. Estimated body mass index is 36.99 kg/m as calculated from the following:   Height as of 03/26/24: 5' 2.5 (1.588 m).   Weight as of 06/20/24: 205 lb 8 oz (93.2 kg).  EKG (optional): deferred due  to virtual visit  GENERAL: alert, oriented, no acute distress detected, full vision exam deferred due to pandemic and/or virtual encounter   Flowsheet Row Office Visit from 02/22/2023 in [facility]  PHQ-9 Total Score 6        07/10/2024   11:16 AM 02/25/2024   10:45 AM 07/10/2023   10:15 AM 02/22/2023   11:00 AM 06/06/2022    8:55 AM  Depression screen PHQ 2/9  Decreased Interest 0 0 0 2 0  Down, Depressed, Hopeless 0 0 0 0 0  PHQ - 2 Score 0 0 0 2 0  Altered sleeping    0   Tired, decreased energy    2   Change in appetite    2   Feeling bad or failure about yourself     0   Trouble concentrating    0   Moving slowly or fidgety/restless    0   Suicidal thoughts    0   PHQ-9 Score    6    Difficult doing work/chores    Not difficult at all      Data saved with a previous flowsheet row definition       06/06/2022    8:58 AM 02/22/2023   11:00 AM 07/10/2023   10:08 AM 02/25/2024   10:45 AM 07/10/2024   11:10 AM  Fall Risk  Falls in the past year? 0 0 0 0 0  Was there an injury with Fall? 0  0  0  0  0  Fall Risk Category Calculator 0 0 0 0 0  Fall Risk Category (Retired) Low       (RETIRED) Patient Fall Risk Level Low fall risk       Patient at Risk for Falls Due to No Fall Risks No Fall Risks No Fall Risks No Fall Risks No Fall Risks  Fall risk Follow up Falls prevention discussed  Falls evaluation completed Falls prevention discussed;Education provided Falls evaluation completed Falls evaluation completed     Data saved with a previous flowsheet row definition     SUMMARY AND PLAN:  Encounter for Medicare annual wellness exam   Discussed applicable health maintenance/preventive health measures and advised and referred or ordered per patient preferences: -reports is scheduled for her mammogram -had normal bone density test a few year ago, she plans to talk with Dr. Randeen about further testing rather than have  me order today -had  flu shot and agrees to get record -she plans to get the covid and shingles at the pharmacy, advised to let us  know when she does so that we can update her record Health Maintenance  Topic Date Due   Influenza Vaccine  02/29/2024   Mammogram  07/29/2024   COVID-19 Vaccine (3 - Pfizer risk series) 03/09/2025 (Originally 11/10/2019)   Zoster Vaccines- Shingrix (1 of 2) 05/24/2026 (Originally 04/13/1965)   Medicare Annual Wellness (AWV)  07/10/2025   DTaP/Tdap/Td (3 - Td or Tdap) 07/04/2026   Pneumococcal Vaccine: 50+ Years  Completed   Bone Density Scan  Completed   Hepatitis C Screening  Completed   Meningococcal B Vaccine  Aged Out   Fecal DNA (Cologuard)  Discontinued      Education and counseling on the following was provided based on the above review of health and a plan/checklist for the patient, along with additional information discussed, was provided for the patient in the patient instructions :  -Advised on importance of completing advanced directives, discussed options for completing and provided information in patient instructions as well -Advised and counseled on a healthy lifestyle - including the importance of a healthy diet, regular physical activity, social connections and stress management. -Reviewed patient's current diet. Advised and counseled on a whole foods based healthy diet. A summary of a healthy diet was provided in the Patient Instructions.  -reviewed patient's current physical activity level and discussed exercise guidelines for adults. Discussed community resources and ideas for safe exercise at home to assist in meeting exercise guideline recommendations in a safe and healthy way.  -Advise regular eye exams   Follow up: see patient instructions     Patient Instructions  I really enjoyed getting to talk with you today! I am available on Tuesdays and Thursdays for virtual visits if you have any questions or concerns, or if I can be of any further assistance.    CHECKLIST FROM ANNUAL WELLNESS VISIT:  -Follow up (please call to schedule if not scheduled after visit):   -yearly for annual wellness visit with primary care office  Here is a list of your preventive care/health maintenance measures and the plan for each if any are due:  PLAN For any measures below that may be due:    1. Can get the vaccines at the pharmacy, please let us  know if you do so that we can update your record.    2. Please let us  know if you would like to do the bone density test.   Health Maintenance  Topic Date Due   Influenza Vaccine  02/29/2024   Medicare Annual Wellness (AWV)  07/09/2024   Mammogram  07/29/2024   COVID-19 Vaccine (3 - Pfizer risk series) 03/09/2025 (Originally 11/10/2019)   Zoster Vaccines- Shingrix (1 of 2) 05/24/2026 (Originally 04/13/1965)   DTaP/Tdap/Td (3 - Td or Tdap) 07/04/2026   Pneumococcal Vaccine: 50+ Years  Completed   Bone Density Scan  Completed   Hepatitis C Screening  Completed   Meningococcal B Vaccine  Aged Out   Fecal DNA (Cologuard)  Discontinued    -See a dentist at least yearly  -Get your eyes checked and then per your eye specialist's recommendations  -Other issues addressed today:   -I have included below further information regarding a healthy whole foods based diet, physical activity guidelines for adults, stress management and opportunities for social connections. I hope you find this information useful.   -----------------------------------------------------------------------------------------------------------------------------------------------------------------------------------------------------------------------------------------------------------    NUTRITION: -eat  real food: lots of colorful vegetables (half the plate) and fruits -5-7 servings of vegetables and fruits per day (fresh or steamed is best), exp. 2 servings of vegetables with lunch and dinner and 2 servings of fruit per day. Berries and  greens such as kale and collards are great choices.  -consume on a regular basis:  fresh fruits, fresh veggies, fish, nuts, seeds, healthy oils (such as olive oil, avocado oil), whole grains (make sure for bread/pasta/crackers/etc., that the first ingredient on label contains the word whole), legumes. -can eat small amounts of dairy and lean meat (no larger than the palm of your hand), but avoid processed meats such as ham, bacon, lunch meat, etc. -drink water -try to avoid fast food and pre-packaged foods, processed meat, ultra processed foods/beverages (donuts, candy, etc.) -most experts advise limiting sodium to < 2300mg  per day, should limit further is any chronic conditions such as high blood pressure, heart disease, diabetes, etc. The American Heart Association advised that < 1500mg  is is ideal -try to avoid foods/beverages that contain any ingredients with names you do not recognize  -try to avoid foods/beverages  with added sugar or sweeteners/sweets  -try to avoid sweet drinks (including diet drinks): soda, juice, Gatorade, sweet tea, power drinks, diet drinks -try to avoid white rice, white bread, pasta (unless whole grain)  EXERCISE GUIDELINES FOR ADULTS: -if you wish to increase your physical activity, do so gradually and with the approval of your doctor -STOP and seek medical care immediately if you have any chest pain, chest discomfort or trouble breathing when starting or increasing exercise  -move and stretch your body, legs, feet and arms when sitting for long periods -Physical activity guidelines for optimal health in adults: -get at least 150 minutes per week of moderate exercise (can talk, but not sing); this is about 20-30 minutes of sustained activity 5-7 days per week or two 10-15 minute episodes of sustained activity 5-7 days per week -do some muscle building/resistance training/strength training at least 2 days per week  -balance exercises 3+ days per week:   Stand  somewhere where you have something sturdy to hold onto if you lose balance    1) lift up on toes, then back down, start with 5x per day and work up to 20x   2) stand and lift one leg straight out to the side so that foot is a few inches of the floor, start with 5x each side and work up to 20x each side   3) stand on one foot, start with 5 seconds each side and work up to 20 seconds on each side  If you need ideas or help with getting more active:  -Silver sneakers https://tools.silversneakers.com  -Walk with a Doc: Http://www.duncan-williams.com/  -try to include resistance (weight lifting/strength building) and balance exercises twice per week: or the following link for ideas: http://castillo-powell.com/  buyducts.dk  STRESS MANAGEMENT: -can try meditating, or just sitting quietly with deep breathing while intentionally relaxing all parts of your body for 5 minutes daily -if you need further help with stress, anxiety or depression please follow up with your primary doctor or contact the wonderful folks at [facility]: [phone]  SOCIAL CONNECTIONS: -options in [place] if you wish to engage in more social and exercise related activities:  -Silver sneakers https://tools.silversneakers.com  -Walk with a Doc: Http://www.duncan-williams.com/  -Check out the [facility] Active Adults 50+ section on the [place] of [employer] companies (hiking clubs, book clubs, cards and games, chess, exercise classes, aquatic classes  and much more) - see the website for details: https://www.[place].gov/departments/parks-recreation/active-adults50  -YouTube has lots of exercise videos for different ages and abilities as well  -[facility] (a variety of indoor and outdoor inperson activities for adults). [phone]. [address]  -Virtual Online Classes (a variety of topics): see  seniorplanet.org or call 4806599319  -consider volunteering at a school, hospice center, church, senior center or elsewhere            Chiquita JONELLE Cramp, DO      [1]  Current Outpatient Medications on File Prior to Visit  Medication Sig Dispense Refill   Acetaminophen -Pamabrom (BACKAID MAX) 500-25 MG TABS Take 2 capsules by mouth daily.     Biotin 10 MG TABS Take 10 mg by mouth in the morning and at bedtime.     CALCIUM  PO Take 1 tablet by mouth daily.     chlorthalidone  (HYGROTON ) 25 MG tablet Take 1 tablet by mouth once daily 90 tablet 2   Cholecalciferol (D3 VITAMIN PO) Take 4,000 Units by mouth daily.     diclofenac Sodium (VOLTAREN) 1 % GEL Apply 2 g topically 4 (four) times daily as needed (pain).     Evolocumab  (REPATHA  SURECLICK) 140 MG/ML SOAJ Inject 140 mg into the skin every 14 (fourteen) days. 6 mL 2   levothyroxine  (SYNTHROID ) 75 MCG tablet TAKE 1 TABLET BY MOUTH ONCE DAILY BEFORE BREAKFAST 90 tablet 2   loratadine (CLARITIN) 10 MG tablet Take 10 mg by mouth daily as needed for allergies.     Multiple Vitamin (MULTIVITAMIN) capsule Take 1 capsule by mouth daily.     omeprazole  (PRILOSEC) 20 MG capsule TAKE 1 CAPSULE BY MOUTH TWICE DAILY BEFORE A MEAL 180 capsule 1   OVER THE COUNTER MEDICATION Take 1 capsule by mouth daily. InstaFlex     spironolactone  (ALDACTONE ) 25 MG tablet Take 1/2 (one-half) tablet by mouth once daily 45 tablet 2   No current facility-administered medications on file prior to visit.  [2]  Allergies Allergen Reactions   Crestor  [Rosuvastatin  Calcium ] Other (See Comments)    Myalgia    Lipitor [Atorvastatin Calcium ] Other (See Comments)    Aches and pains   Penicillins Shortness Of Breath and Swelling   Zocor [Simvastatin - High Dose] Other (See Comments)    Muscle/joint aches   Ferrex 150 [Polysaccharide Iron  Complex]     Indigestion

## 2024-07-10 NOTE — Patient Instructions (Signed)
 I really enjoyed getting to talk with you today! I am available on Tuesdays and Thursdays for virtual visits if you have any questions or concerns, or if I can be of any further assistance.   CHECKLIST FROM ANNUAL WELLNESS VISIT:  -Follow up (please call to schedule if not scheduled after visit):   -yearly for annual wellness visit with primary care office  Here is a list of your preventive care/health maintenance measures and the plan for each if any are due:  PLAN For any measures below that may be due:    1. Can get the vaccines at the pharmacy, please let us  know if you do so that we can update your record.    2. Please let us  know if you would like to do the bone density test.   Health Maintenance  Topic Date Due   Influenza Vaccine  02/29/2024   Medicare Annual Wellness (AWV)  07/09/2024   Mammogram  07/29/2024   COVID-19 Vaccine (3 - Pfizer risk series) 03/09/2025 (Originally 11/10/2019)   Zoster Vaccines- Shingrix (1 of 2) 05/24/2026 (Originally 04/13/1965)   DTaP/Tdap/Td (3 - Td or Tdap) 07/04/2026   Pneumococcal Vaccine: 50+ Years  Completed   Bone Density Scan  Completed   Hepatitis C Screening  Completed   Meningococcal B Vaccine  Aged Out   Fecal DNA (Cologuard)  Discontinued    -See a dentist at least yearly  -Get your eyes checked and then per your eye specialist's recommendations  -Other issues addressed today:   -I have included below further information regarding a healthy whole foods based diet, physical activity guidelines for adults, stress management and opportunities for social connections. I hope you find this information useful.   -----------------------------------------------------------------------------------------------------------------------------------------------------------------------------------------------------------------------------------------------------------    NUTRITION: -eat real food: lots of colorful vegetables (half the  plate) and fruits -5-7 servings of vegetables and fruits per day (fresh or steamed is best), exp. 2 servings of vegetables with lunch and dinner and 2 servings of fruit per day. Berries and greens such as kale and collards are great choices.  -consume on a regular basis:  fresh fruits, fresh veggies, fish, nuts, seeds, healthy oils (such as olive oil, avocado oil), whole grains (make sure for bread/pasta/crackers/etc., that the first ingredient on label contains the word whole), legumes. -can eat small amounts of dairy and lean meat (no larger than the palm of your hand), but avoid processed meats such as ham, bacon, lunch meat, etc. -drink water -try to avoid fast food and pre-packaged foods, processed meat, ultra processed foods/beverages (donuts, candy, etc.) -most experts advise limiting sodium to < 2300mg  per day, should limit further is any chronic conditions such as high blood pressure, heart disease, diabetes, etc. The American Heart Association advised that < 1500mg  is is ideal -try to avoid foods/beverages that contain any ingredients with names you do not recognize  -try to avoid foods/beverages  with added sugar or sweeteners/sweets  -try to avoid sweet drinks (including diet drinks): soda, juice, Gatorade, sweet tea, power drinks, diet drinks -try to avoid white rice, white bread, pasta (unless whole grain)  EXERCISE GUIDELINES FOR ADULTS: -if you wish to increase your physical activity, do so gradually and with the approval of your doctor -STOP and seek medical care immediately if you have any chest pain, chest discomfort or trouble breathing when starting or increasing exercise  -move and stretch your body, legs, feet and arms when sitting for long periods -Physical activity guidelines for optimal health in adults: -get at  least 150 minutes per week of moderate exercise (can talk, but not sing); this is about 20-30 minutes of sustained activity 5-7 days per week or two 10-15 minute  episodes of sustained activity 5-7 days per week -do some muscle building/resistance training/strength training at least 2 days per week  -balance exercises 3+ days per week:   Stand somewhere where you have something sturdy to hold onto if you lose balance    1) lift up on toes, then back down, start with 5x per day and work up to 20x   2) stand and lift one leg straight out to the side so that foot is a few inches of the floor, start with 5x each side and work up to 20x each side   3) stand on one foot, start with 5 seconds each side and work up to 20 seconds on each side  If you need ideas or help with getting more active:  -Silver sneakers https://tools.silversneakers.com  -Walk with a Doc: Http://www.duncan-williams.com/  -try to include resistance (weight lifting/strength building) and balance exercises twice per week: or the following link for ideas: http://castillo-powell.com/  buyducts.dk  STRESS MANAGEMENT: -can try meditating, or just sitting quietly with deep breathing while intentionally relaxing all parts of your body for 5 minutes daily -if you need further help with stress, anxiety or depression please follow up with your primary doctor or contact the wonderful folks at [facility]: [phone]  SOCIAL CONNECTIONS: -options in [place] if you wish to engage in more social and exercise related activities:  -Silver sneakers https://tools.silversneakers.com  -Walk with a Doc: Http://www.duncan-williams.com/  -Check out the [facility] Active Adults 50+ section on the [place] [employer] (hiking clubs, book clubs, cards and games, chess, exercise classes, aquatic classes and much more) - see the website for details: https://www.[place].gov/departments/parks-recreation/active-adults50  -YouTube has lots of exercise videos for different ages and abilities as  well  -[facility] (a variety of indoor and outdoor inperson activities for adults). [phone]. [address].  -Virtual Online Classes (a variety of topics): see seniorplanet.org or call [phone]  -consider volunteering at a school, hospice center, church, senior center or elsewhere

## 2024-07-18 ENCOUNTER — Other Ambulatory Visit: Payer: Self-pay

## 2024-07-18 ENCOUNTER — Inpatient Hospital Stay: Attending: Hematology and Oncology

## 2024-07-18 DIAGNOSIS — E538 Deficiency of other specified B group vitamins: Secondary | ICD-10-CM | POA: Diagnosis present

## 2024-07-18 DIAGNOSIS — D509 Iron deficiency anemia, unspecified: Secondary | ICD-10-CM

## 2024-07-18 MED ORDER — CYANOCOBALAMIN 1000 MCG/ML IJ SOLN
1000.0000 ug | Freq: Once | INTRAMUSCULAR | Status: AC
  Administered 2024-07-18: 1000 ug via INTRAMUSCULAR
  Filled 2024-07-18: qty 1

## 2024-07-30 ENCOUNTER — Ambulatory Visit
Admission: RE | Admit: 2024-07-30 | Discharge: 2024-07-30 | Disposition: A | Discharge status: Home / Self Care | Source: Ambulatory Visit | Attending: Family Medicine | Admitting: Family Medicine

## 2024-07-30 DIAGNOSIS — Z1231 Encounter for screening mammogram for malignant neoplasm of breast: Secondary | ICD-10-CM

## 2024-08-03 ENCOUNTER — Ambulatory Visit: Payer: Self-pay | Admitting: Family Medicine

## 2024-08-05 ENCOUNTER — Telehealth: Payer: Self-pay | Admitting: Physician Assistant

## 2024-08-05 NOTE — Telephone Encounter (Signed)
 I spoke w/ pt regarding rescheduling 2/13 appt. Patient is aware of new appt date and time.

## 2024-08-15 ENCOUNTER — Inpatient Hospital Stay: Attending: Hematology and Oncology

## 2024-08-15 DIAGNOSIS — E538 Deficiency of other specified B group vitamins: Secondary | ICD-10-CM | POA: Diagnosis present

## 2024-08-15 DIAGNOSIS — D509 Iron deficiency anemia, unspecified: Secondary | ICD-10-CM

## 2024-08-15 MED ORDER — CYANOCOBALAMIN 1000 MCG/ML IJ SOLN
1000.0000 ug | Freq: Once | INTRAMUSCULAR | Status: AC
  Administered 2024-08-15: 1000 ug via INTRAMUSCULAR
  Filled 2024-08-15: qty 1

## 2024-08-21 ENCOUNTER — Encounter: Payer: Self-pay | Admitting: Physician Assistant

## 2024-08-21 NOTE — Telephone Encounter (Signed)
-----   Message from Nurse Almarie DASEN, RN sent at 08/18/2024  5:31 PM EST -----  ----- Message ----- From: Federico Norleen DASEN [place], MD Sent: 08/10/2024   9:08 AM EST To: Almarie DELENA Arabia, RN  Please let Mrs. Easterwood know that her Mammogram looks good, next one will be due in 1 years time.

## 2024-08-21 NOTE — Telephone Encounter (Signed)
 Spoke with patient and forwarded the following message from Dr Federico  Please let Jennifer Bartlett know that her Mammogram looks good, next one will be due in 1 years time.  Pt voiced an understanding.

## 2024-09-12 ENCOUNTER — Other Ambulatory Visit

## 2024-09-12 ENCOUNTER — Inpatient Hospital Stay

## 2024-09-12 ENCOUNTER — Ambulatory Visit: Admitting: Physician Assistant

## 2024-09-19 ENCOUNTER — Inpatient Hospital Stay: Admitting: Physician Assistant

## 2024-09-19 ENCOUNTER — Inpatient Hospital Stay

## 2024-09-19 ENCOUNTER — Inpatient Hospital Stay: Attending: Hematology and Oncology
